# Patient Record
Sex: Male | Born: 1935 | Race: White | Hispanic: No | Marital: Married | Smoking: Former smoker
Health system: Southern US, Community
[De-identification: ages and names within clinical notes are randomized; demographics above are authoritative.]

## PROBLEM LIST (undated history)

## (undated) DIAGNOSIS — K5790 Diverticulosis of intestine, part unspecified, without perforation or abscess without bleeding: Secondary | ICD-10-CM

## (undated) DIAGNOSIS — I480 Paroxysmal atrial fibrillation: Secondary | ICD-10-CM

## (undated) DIAGNOSIS — F0280 Dementia in other diseases classified elsewhere without behavioral disturbance: Secondary | ICD-10-CM

## (undated) DIAGNOSIS — Z7901 Long term (current) use of anticoagulants: Secondary | ICD-10-CM

## (undated) DIAGNOSIS — M1990 Unspecified osteoarthritis, unspecified site: Secondary | ICD-10-CM

## (undated) DIAGNOSIS — R200 Anesthesia of skin: Secondary | ICD-10-CM

## (undated) DIAGNOSIS — R202 Paresthesia of skin: Secondary | ICD-10-CM

## (undated) DIAGNOSIS — R351 Nocturia: Secondary | ICD-10-CM

## (undated) DIAGNOSIS — G20 Parkinson's disease: Secondary | ICD-10-CM

## (undated) DIAGNOSIS — G20A1 Parkinson's disease without dyskinesia, without mention of fluctuations: Secondary | ICD-10-CM

## (undated) DIAGNOSIS — F419 Anxiety disorder, unspecified: Secondary | ICD-10-CM

## (undated) DIAGNOSIS — D649 Anemia, unspecified: Secondary | ICD-10-CM

## (undated) DIAGNOSIS — Z973 Presence of spectacles and contact lenses: Secondary | ICD-10-CM

## (undated) DIAGNOSIS — M7120 Synovial cyst of popliteal space [Baker], unspecified knee: Secondary | ICD-10-CM

## (undated) DIAGNOSIS — I951 Orthostatic hypotension: Secondary | ICD-10-CM

## (undated) DIAGNOSIS — G309 Alzheimer's disease, unspecified: Secondary | ICD-10-CM

## (undated) DIAGNOSIS — G250 Essential tremor: Secondary | ICD-10-CM

## (undated) DIAGNOSIS — G909 Disorder of the autonomic nervous system, unspecified: Secondary | ICD-10-CM

## (undated) DIAGNOSIS — K648 Other hemorrhoids: Secondary | ICD-10-CM

## (undated) DIAGNOSIS — K5909 Other constipation: Secondary | ICD-10-CM

## (undated) HISTORY — DX: Other constipation: K59.09

## (undated) HISTORY — DX: Anxiety disorder, unspecified: F41.9

## (undated) HISTORY — DX: Diverticulosis of intestine, part unspecified, without perforation or abscess without bleeding: K57.90

## (undated) HISTORY — PX: TONSILLECTOMY: SUR1361

## (undated) HISTORY — PX: COLONOSCOPY: SHX174

## (undated) HISTORY — DX: Parkinson's disease without dyskinesia, without mention of fluctuations: G20.A1

## (undated) HISTORY — DX: Synovial cyst of popliteal space (Baker), unspecified knee: M71.20

## (undated) HISTORY — DX: Anemia, unspecified: D64.9

## (undated) HISTORY — DX: Other hemorrhoids: K64.8

## (undated) HISTORY — DX: Parkinson's disease: G20

---

## 1999-03-11 HISTORY — PX: OTHER SURGICAL HISTORY: SHX169

## 2001-01-08 HISTORY — PX: HAND SURGERY: SHX662

## 2011-03-11 HISTORY — PX: CATARACT EXTRACTION W/ INTRAOCULAR LENS IMPLANT: SHX1309

## 2014-02-07 DIAGNOSIS — G20 Parkinson's disease: Secondary | ICD-10-CM | POA: Insufficient documentation

## 2014-02-07 DIAGNOSIS — F0280 Dementia in other diseases classified elsewhere without behavioral disturbance: Secondary | ICD-10-CM | POA: Insufficient documentation

## 2014-08-18 ENCOUNTER — Ambulatory Visit (INDEPENDENT_AMBULATORY_CARE_PROVIDER_SITE_OTHER): Payer: Medicare Other | Admitting: Neurology

## 2014-08-18 ENCOUNTER — Encounter (INDEPENDENT_AMBULATORY_CARE_PROVIDER_SITE_OTHER): Payer: Self-pay

## 2014-08-18 ENCOUNTER — Encounter: Payer: Self-pay | Admitting: Neurology

## 2014-08-18 VITALS — BP 150/82 | HR 66 | Resp 16 | Ht 74.0 in | Wt 197.4 lb

## 2014-08-18 DIAGNOSIS — F329 Major depressive disorder, single episode, unspecified: Secondary | ICD-10-CM | POA: Diagnosis not present

## 2014-08-18 DIAGNOSIS — R0683 Snoring: Secondary | ICD-10-CM | POA: Diagnosis not present

## 2014-08-18 DIAGNOSIS — F32A Depression, unspecified: Secondary | ICD-10-CM | POA: Insufficient documentation

## 2014-08-18 DIAGNOSIS — G20 Parkinson's disease: Secondary | ICD-10-CM

## 2014-08-18 DIAGNOSIS — F320 Major depressive disorder, single episode, mild: Secondary | ICD-10-CM | POA: Insufficient documentation

## 2014-08-18 DIAGNOSIS — R4189 Other symptoms and signs involving cognitive functions and awareness: Secondary | ICD-10-CM | POA: Diagnosis not present

## 2014-08-18 MED ORDER — CARBIDOPA-LEVODOPA ER 25-100 MG PO TBCR: EXTENDED_RELEASE_TABLET | ORAL | Status: DC

## 2014-08-18 MED ORDER — CARBIDOPA-LEVODOPA 25-100 MG PO TABS: 1.0000 | ORAL_TABLET | Freq: Two times a day (BID) | ORAL | Status: DC
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
## 2014-08-23 ENCOUNTER — Telehealth: Payer: Self-pay | Admitting: Neurology

## 2014-08-23 MED ORDER — CARBIDOPA-LEVODOPA ER 25-100 MG PO TBCR: EXTENDED_RELEASE_TABLET | ORAL | Status: DC

## 2014-08-23 NOTE — Telephone Encounter (Signed)
It appears the pharmacy did confirm receipt of this Rx on 06/10.  I called them to verify.  Spoke with Nedra Hai.  She said they only have the IR ready, not ER.  Thinks perhaps it was deleted in error, thinking it was a duplicate Rx.  She asked that we re-send this prescription, which has been done.   Receipt confirmed by pharmacy.  I called the patient back to advise.  Got no answer.  Left message.

## 2014-08-23 NOTE — Telephone Encounter (Signed)
Patient's wife([name]) called stating the pharmacy is stating the Carbidopa-Levodopa ER (SINEMET CR) 25-100 MG tablet controlled release  Script was not received. She says the pharmacy told the patient he would have to take care of it. He is very anxious. Please call and advise. Patient can be reached at [phone].

## 2014-09-12 ENCOUNTER — Telehealth: Payer: Self-pay | Admitting: Neurology

## 2014-09-12 NOTE — Telephone Encounter (Signed)
Patients wife called and requested to speak with the nurse regarding her husbands medication Rx. Carbidopa-Levodopa ER (SINEMET CR) 25-100 MG tablet controlled release. She states that he is experiencing some negative side effects from the medication. Please call and advise.

## 2014-09-12 NOTE — Telephone Encounter (Signed)
I have spoken with Mr. Stephen Gentry this afternoon and per RAS, advised that  constipation/neck pain are likely not related to Sinemet, but that if he would like to d/c hs dose of ER Sinemet, he may but should continue regular Sinemet as rx'd.  Mr. Stephen Gentry verbalized understanding of same/fim

## 2014-09-12 NOTE — Telephone Encounter (Signed)
LMTC./fim 

## 2014-09-12 NOTE — Telephone Encounter (Signed)
Patients wife (catherine) called returning Faith's call.

## 2015-02-19 ENCOUNTER — Ambulatory Visit (INDEPENDENT_AMBULATORY_CARE_PROVIDER_SITE_OTHER): Payer: Medicare Other | Admitting: Neurology

## 2015-02-19 ENCOUNTER — Encounter: Payer: Self-pay | Admitting: Neurology

## 2015-02-19 VITALS — BP 128/70 | HR 72 | Resp 14 | Ht 74.0 in | Wt 197.0 lb

## 2015-02-19 DIAGNOSIS — E559 Vitamin D deficiency, unspecified: Secondary | ICD-10-CM | POA: Diagnosis not present

## 2015-02-19 DIAGNOSIS — R413 Other amnesia: Secondary | ICD-10-CM | POA: Diagnosis not present

## 2015-02-19 DIAGNOSIS — R5383 Other fatigue: Secondary | ICD-10-CM | POA: Insufficient documentation

## 2015-02-19 DIAGNOSIS — G20 Parkinson's disease: Secondary | ICD-10-CM

## 2015-02-19 DIAGNOSIS — F32A Depression, unspecified: Secondary | ICD-10-CM

## 2015-02-19 DIAGNOSIS — F329 Major depressive disorder, single episode, unspecified: Secondary | ICD-10-CM

## 2015-02-19 DIAGNOSIS — F320 Major depressive disorder, single episode, mild: Secondary | ICD-10-CM

## 2015-02-19 DIAGNOSIS — R0683 Snoring: Secondary | ICD-10-CM

## 2015-02-19 DIAGNOSIS — E569 Vitamin deficiency, unspecified: Secondary | ICD-10-CM | POA: Diagnosis not present

## 2015-02-19 MED ORDER — ESCITALOPRAM OXALATE 10 MG PO TABS: 10.0000 mg | ORAL_TABLET | Freq: Every day | ORAL | Status: DC

## 2015-02-19 MED ORDER — CARBIDOPA-LEVODOPA 25-100 MG PO TABS: 1.0000 | ORAL_TABLET | Freq: Three times a day (TID) | ORAL | Status: DC
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
## 2015-02-20 ENCOUNTER — Telehealth: Payer: Self-pay | Admitting: *Deleted

## 2015-02-20 LAB — TSH: TSH: 2.93 u[IU]/mL (ref 0.450–4.500)

## 2015-02-20 LAB — VITAMIN D 25 HYDROXY (VIT D DEFICIENCY, FRACTURES): VIT D 25 HYDROXY: 42.5 ng/mL (ref 30.0–100.0)

## 2015-02-20 LAB — VITAMIN B12: Vitamin B-12: 441 pg/mL (ref 211–946)

## 2015-02-20 NOTE — Telephone Encounter (Signed)
-----   Message from Asa Lenteichard A Sater, MD sent at 02/20/2015  8:27 AM EST ----- Please let them know that the lab work looks normal

## 2015-02-20 NOTE — Telephone Encounter (Signed)
-----   Message from Richard A Sater, MD sent at 02/20/2015  8:27 AM EST ----- Please let them know that the lab work looks normal 

## 2015-02-20 NOTE — Telephone Encounter (Signed)
I have spoken with Baldo AshCarl this afternoon, and per RAS, advised that labs done in our office yesterday were normal.  He verbalized understanding of same/fim

## 2015-02-20 NOTE — Telephone Encounter (Signed)
LMTC./fim 

## 2015-03-14 ENCOUNTER — Ambulatory Visit (INDEPENDENT_AMBULATORY_CARE_PROVIDER_SITE_OTHER): Payer: Medicare Other | Admitting: Neurology

## 2015-03-14 DIAGNOSIS — R5383 Other fatigue: Secondary | ICD-10-CM

## 2015-03-14 DIAGNOSIS — R0683 Snoring: Secondary | ICD-10-CM

## 2015-03-14 DIAGNOSIS — G4710 Hypersomnia, unspecified: Secondary | ICD-10-CM

## 2015-03-14 DIAGNOSIS — R413 Other amnesia: Secondary | ICD-10-CM

## 2015-03-14 DIAGNOSIS — G2581 Restless legs syndrome: Secondary | ICD-10-CM

## 2015-03-15 NOTE — Sleep Study (Signed)
Please see the scanned sleep study interpretation located in the procedure tab in the chart view section.  

## 2015-03-16 ENCOUNTER — Telehealth: Payer: Self-pay | Admitting: *Deleted

## 2015-03-16 NOTE — Telephone Encounter (Signed)
-----   Message from Asa Lenteichard A Sater, MD sent at 03/15/2015  3:07 PM EST ----- Sleep study did not show any sleep apnea. However, he does have a lot of restless leg syndrome. We can start ropinirole 0.25 mg, 1 at dinner and one at bedtime if he wants      #60   #11

## 2015-03-16 NOTE — Telephone Encounter (Signed)
I have spoken with wife and per RAS, advised that Mr. Stephen Gentry's sleep study did not show any osa, but he does have a lot of rls.  Have offered Requip per RAS.  Mrs. [name] sts. pt. does not like to take meds--she does not believe he will take Requip.  She will discuss this message with him and they will call back if he would like rx. for Requip./fim

## 2015-06-22 ENCOUNTER — Ambulatory Visit (INDEPENDENT_AMBULATORY_CARE_PROVIDER_SITE_OTHER): Payer: Medicare Other | Admitting: Neurology

## 2015-06-22 ENCOUNTER — Encounter: Payer: Self-pay | Admitting: Neurology

## 2015-06-22 VITALS — BP 112/72 | HR 67 | Ht 74.0 in | Wt 200.0 lb

## 2015-06-22 DIAGNOSIS — R0683 Snoring: Secondary | ICD-10-CM

## 2015-06-22 DIAGNOSIS — G20 Parkinson's disease: Secondary | ICD-10-CM | POA: Diagnosis not present

## 2015-06-22 DIAGNOSIS — F320 Major depressive disorder, single episode, mild: Secondary | ICD-10-CM

## 2015-06-22 DIAGNOSIS — G4761 Periodic limb movement disorder: Secondary | ICD-10-CM

## 2015-06-22 DIAGNOSIS — R413 Other amnesia: Secondary | ICD-10-CM

## 2015-06-22 DIAGNOSIS — F329 Major depressive disorder, single episode, unspecified: Secondary | ICD-10-CM | POA: Diagnosis not present

## 2015-06-22 DIAGNOSIS — R5383 Other fatigue: Secondary | ICD-10-CM | POA: Diagnosis not present

## 2015-06-22 DIAGNOSIS — F32A Depression, unspecified: Secondary | ICD-10-CM

## 2015-06-22 MED ORDER — CARBIDOPA-LEVODOPA ER 25-100 MG PO TBCR: 1.0000 | EXTENDED_RELEASE_TABLET | Freq: Two times a day (BID) | ORAL | Status: DC
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
## 2015-06-22 NOTE — Patient Instructions (Signed)
Take 1 pill of the immediate release in the morning, 1 pill of the immediate release in the afternoon and one half or one pill of the time release at bedtime

## 2015-10-23 ENCOUNTER — Ambulatory Visit (INDEPENDENT_AMBULATORY_CARE_PROVIDER_SITE_OTHER): Payer: Medicare Other | Admitting: Neurology

## 2015-10-23 ENCOUNTER — Encounter: Payer: Self-pay | Admitting: Neurology

## 2015-10-23 VITALS — BP 104/62 | Resp 18 | Ht 74.0 in | Wt 190.0 lb

## 2015-10-23 DIAGNOSIS — G4761 Periodic limb movement disorder: Secondary | ICD-10-CM

## 2015-10-23 DIAGNOSIS — F329 Major depressive disorder, single episode, unspecified: Secondary | ICD-10-CM

## 2015-10-23 DIAGNOSIS — R4189 Other symptoms and signs involving cognitive functions and awareness: Secondary | ICD-10-CM

## 2015-10-23 DIAGNOSIS — R5383 Other fatigue: Secondary | ICD-10-CM

## 2015-10-23 DIAGNOSIS — G20 Parkinson's disease: Secondary | ICD-10-CM | POA: Diagnosis not present

## 2015-10-23 DIAGNOSIS — F32A Depression, unspecified: Secondary | ICD-10-CM

## 2015-10-23 DIAGNOSIS — F320 Major depressive disorder, single episode, mild: Secondary | ICD-10-CM

## 2015-10-23 MED ORDER — CARBIDOPA-LEVODOPA 25-100 MG PO TABS: 1.0000 | ORAL_TABLET | Freq: Three times a day (TID) | ORAL | 11 refills | Status: DC

## 2015-10-23 MED ORDER — AMANTADINE HCL 100 MG PO CAPS: 100.0000 mg | ORAL_CAPSULE | Freq: Two times a day (BID) | ORAL | 11 refills | Status: DC

## 2015-10-23 MED ORDER — ESCITALOPRAM OXALATE 10 MG PO TABS
10.0000 mg | ORAL_TABLET | Freq: Every day | ORAL | 11 refills | Status: DC
Start: 2015-10-23 — End: 2016-04-30

## 2015-10-23 MED ORDER — CARBIDOPA-LEVODOPA ER 25-100 MG PO TBCR: 1.0000 | EXTENDED_RELEASE_TABLET | Freq: Two times a day (BID) | ORAL | 11 refills | Status: DC
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
## 2015-10-30 ENCOUNTER — Telehealth: Payer: Self-pay | Admitting: Neurology

## 2015-10-30 NOTE — Telephone Encounter (Signed)
Leigh with [facility] in [place] is calling to clarify the correct dosaged for medications  carbidopa-levodopa (SINEMET IR) 25-100 MG tablet and Carbidopa-Levodopa ER (SINEMET CR) 25-100 MG tablet controlled release,

## 2015-10-30 NOTE — Telephone Encounter (Signed)
I have spoken with Leigh at [facility] and confirmed doses for pt's IR and ER Sinemet/fim

## 2015-11-08 NOTE — Telephone Encounter (Signed)
[facility] [address] [place] [phone] called to advise Extended Release of Carbidopa-Levodopa ER (SINEMET CR) 25-100 MG tablet controlled release is not available, availability is unknown at this time.

## 2016-03-03 ENCOUNTER — Other Ambulatory Visit: Payer: Self-pay | Admitting: Neurology

## 2016-04-24 ENCOUNTER — Encounter: Payer: Self-pay | Admitting: Neurology

## 2016-04-24 ENCOUNTER — Ambulatory Visit (INDEPENDENT_AMBULATORY_CARE_PROVIDER_SITE_OTHER): Payer: Medicare Other | Admitting: Neurology

## 2016-04-24 VITALS — BP 118/68 | HR 66 | Resp 18 | Ht 74.0 in | Wt 193.0 lb

## 2016-04-24 DIAGNOSIS — R5383 Other fatigue: Secondary | ICD-10-CM

## 2016-04-24 DIAGNOSIS — F32A Depression, unspecified: Secondary | ICD-10-CM

## 2016-04-24 DIAGNOSIS — F419 Anxiety disorder, unspecified: Secondary | ICD-10-CM | POA: Insufficient documentation

## 2016-04-24 DIAGNOSIS — G20 Parkinson's disease: Secondary | ICD-10-CM | POA: Diagnosis not present

## 2016-04-24 DIAGNOSIS — G20A1 Parkinson's disease without dyskinesia, without mention of fluctuations: Secondary | ICD-10-CM

## 2016-04-24 DIAGNOSIS — F320 Major depressive disorder, single episode, mild: Secondary | ICD-10-CM | POA: Diagnosis not present
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
## 2016-04-24 NOTE — Patient Instructions (Signed)
Continue to take the Sinemet immediate release twice a day. At bedtime, take 2 of the time release Sinemet.

## 2016-04-30 ENCOUNTER — Telehealth: Payer: Self-pay | Admitting: *Deleted

## 2016-04-30 MED ORDER — ESCITALOPRAM OXALATE 10 MG PO TABS: 10.0000 mg | ORAL_TABLET | Freq: Every day | ORAL | 3 refills | Status: DC

## 2016-04-30 NOTE — Telephone Encounter (Signed)
90 day rx. for Escitalopram escribed to [facility] per faxed request/fim

## 2016-06-02 ENCOUNTER — Telehealth: Payer: Self-pay | Admitting: *Deleted

## 2016-06-02 MED ORDER — ESCITALOPRAM OXALATE 10 MG PO TABS: 10.0000 mg | ORAL_TABLET | Freq: Every day | ORAL | 3 refills | Status: DC

## 2016-06-02 MED ORDER — CARBIDOPA-LEVODOPA ER 25-100 MG PO TBCR: 1.0000 | EXTENDED_RELEASE_TABLET | Freq: Two times a day (BID) | ORAL | 3 refills | Status: DC

## 2016-06-02 NOTE — Telephone Encounter (Signed)
90 day rx's for Carbidopa CR and Escitalopram escribed to [facility] per faxed request/fim

## 2016-09-23 ENCOUNTER — Telehealth: Payer: Self-pay | Admitting: Neurology

## 2016-09-23 NOTE — Telephone Encounter (Signed)
Patients wife called requesting patient be seen sooner appointment scheduled for 8/30, unable to do this Thursday.  Patient has been very anxious, worsening in constipation, patient feels that he has the flu (without the body aches).  Symptoms began last Wednesday.  Please call

## 2016-09-23 NOTE — Telephone Encounter (Signed)
I have spoken with Baldo AshCarl this afternoon.  He sts. he is having more intermittent shaking, possibly more anxious.  Denies stomach upset or signs of infection.  Appt. given 09/25/16/fim

## 2016-09-25 ENCOUNTER — Encounter: Payer: Self-pay | Admitting: Neurology

## 2016-09-25 ENCOUNTER — Ambulatory Visit (INDEPENDENT_AMBULATORY_CARE_PROVIDER_SITE_OTHER): Payer: Medicare Other | Admitting: Neurology

## 2016-09-25 VITALS — BP 125/73 | HR 82 | Resp 18 | Ht 74.0 in | Wt 182.5 lb

## 2016-09-25 DIAGNOSIS — R413 Other amnesia: Secondary | ICD-10-CM

## 2016-09-25 DIAGNOSIS — F419 Anxiety disorder, unspecified: Secondary | ICD-10-CM | POA: Diagnosis not present

## 2016-09-25 DIAGNOSIS — G20 Parkinson's disease: Secondary | ICD-10-CM | POA: Diagnosis not present

## 2016-09-25 DIAGNOSIS — K5900 Constipation, unspecified: Secondary | ICD-10-CM

## 2016-09-25 DIAGNOSIS — F320 Major depressive disorder, single episode, mild: Secondary | ICD-10-CM | POA: Diagnosis not present

## 2016-09-25 DIAGNOSIS — F32A Depression, unspecified: Secondary | ICD-10-CM

## 2016-09-25 MED ORDER — ESCITALOPRAM OXALATE 20 MG PO TABS
20.0000 mg | ORAL_TABLET | Freq: Every day | ORAL | 3 refills | Status: DC
Start: 2016-09-25 — End: 2016-10-23
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
## 2016-09-25 NOTE — Patient Instructions (Signed)
At bedtime, take 1 capfull of polyethylene glycol, one senna pill and 1 Colace (or Metamucil)  Continue the carbidopa levodopa at the current dose.  I called in the higher dose of Lexapro. In the meantime decongest double up taking 2 of the 10mg  pills instead of 1

## 2016-09-26 ENCOUNTER — Encounter: Payer: Self-pay | Admitting: Neurology
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
## 2016-10-22 ENCOUNTER — Ambulatory Visit: Payer: Medicare Other | Admitting: Neurology

## 2016-10-23 ENCOUNTER — Ambulatory Visit (INDEPENDENT_AMBULATORY_CARE_PROVIDER_SITE_OTHER): Payer: Medicare Other | Admitting: Neurology

## 2016-10-23 ENCOUNTER — Encounter: Payer: Self-pay | Admitting: Neurology

## 2016-10-23 VITALS — BP 108/60 | HR 70 | Ht 75.0 in | Wt 177.0 lb

## 2016-10-23 DIAGNOSIS — F419 Anxiety disorder, unspecified: Secondary | ICD-10-CM

## 2016-10-23 DIAGNOSIS — R413 Other amnesia: Secondary | ICD-10-CM | POA: Diagnosis not present

## 2016-10-23 DIAGNOSIS — G20 Parkinson's disease: Secondary | ICD-10-CM | POA: Diagnosis not present

## 2016-10-23 DIAGNOSIS — K5900 Constipation, unspecified: Secondary | ICD-10-CM | POA: Diagnosis not present

## 2016-10-23 MED ORDER — CARBIDOPA-LEVODOPA 25-100 MG PO TABS: 1.0000 | ORAL_TABLET | Freq: Three times a day (TID) | ORAL | 0 refills | Status: DC

## 2016-10-23 NOTE — Addendum Note (Signed)
Addended bySilvio Pate: MCCRACKEN, JADE L on: 10/23/2016 09:53 AM   Modules accepted: Orders

## 2016-10-23 NOTE — Patient Instructions (Addendum)
1. Change Levodopa to the following: Carbidopa Levodopa 25/100 IR - 1 at 6 am, 1 at 11 am, 1 at 4 pm Carbidopa Levodopa 25/100 CR - 1 at 9 pm (bedtime)  You can take an extra half tablet daily if needed if you feel anxious  2. Patients age 80+:  You have been referred for a neurocognitive evaluation in our office.   The evaluation takes approximately two hours. The first part of the appointment is a clinical interview with the neuropsychologist (Dr. Elvis CoilMaryBeth Bailar). Please bring someone with you to this appointment if possible, as it is helpful for Dr. Alinda DoomsBailar to hear from both you and another adult who knows you well. After speaking with Dr. Alinda DoomsBailar, you will complete testing with her technician. The testing includes a variety of tasks- mostly question-and-answer, some paper-and-pencil. There is nothing you need to do to prepare for this appointment, but having a good night's sleep prior to the testing, and bringing eyeglasses and hearing aids (if you wear them), is advised.   About a week after the evaluation, you will return to follow up with Dr. Alinda DoomsBailar to review the test results. This appointment is about 30 minutes. If you would like a family member to receive this information as well, please bring them to the appointment.   We have to reserve several hours of the neuropsychologist's time and the psychometrician's time for your evaluation appointment. As such, please note that there is a No-Show fee of $100. If you are unable to attend any of your appointments, please contact our office as soon as possible to reschedule.    Patients age 80 and under:  You have been referred for a neurocognitive evaluation in our office.   The evaluation consists of three appointments.   1. The first appointment is about 45 minutes and is a clinical interview with the neuropsychologist (Dr. Elvis CoilMaryBeth Bailar). You can bring someone with you to this appointment, as it is helpful for Dr. Alinda DoomsBailar to hear from  both you and another adult who knows you well.   2. The second appointment is 2-3 hours long and is with the psychometrician Wallace Keller(Dana Chamberlain). You will complete a variety of tasks- mostly question-and-answer, some paper-and-pencil, some on the computer. There is nothing you need to do to prepare for this appointment, but having a good night's sleep prior to the testing, and bringing eyeglasses and hearing aids (if you wear them), is advised.   3. The final appointment is a follow-up with Dr. Alinda DoomsBailar where she will go over the test results with you and provide recommendations. This appointment is about 30 minutes.  If you would like a family member to receive this information as well, please bring them to the appointment.   We have to reserve several hours of the neuropsychologist's time and the psychometrician's time for your appointment. As such, please note that there is a No-Show fee of $100. If you are unable to attend any of your appointments, please contact our office as soon as possible to reschedule.

## 2016-10-23 NOTE — Addendum Note (Signed)
Addended bySilvio Pate: MCCRACKEN, JADE L on: 10/23/2016 10:15 AM   Modules accepted: Orders

## 2016-10-27 ENCOUNTER — Ambulatory Visit (INDEPENDENT_AMBULATORY_CARE_PROVIDER_SITE_OTHER): Payer: Medicare Other | Admitting: Psychology

## 2016-10-27 ENCOUNTER — Telehealth: Payer: Self-pay | Admitting: Neurology

## 2016-10-27 ENCOUNTER — Encounter: Payer: Self-pay | Admitting: Psychology

## 2016-10-27 DIAGNOSIS — G20 Parkinson's disease: Secondary | ICD-10-CM | POA: Diagnosis not present

## 2016-10-27 DIAGNOSIS — R413 Other amnesia: Secondary | ICD-10-CM

## 2016-10-27 DIAGNOSIS — F419 Anxiety disorder, unspecified: Secondary | ICD-10-CM

## 2016-10-27 NOTE — Telephone Encounter (Signed)
Stephen Distance, PsyD sent to Silvio Pate, CMA  Cc: Tat, Octaviano Batty, DO        This patient is going to start testing with Annabelle Harman in a minute or so, and wife would like the [employer] Can you give to her in the waiting room? They will be here for the next hour or so.    Recipe given.

## 2016-10-27 NOTE — Progress Notes (Signed)
   Neuropsychology Note  Stephen Gentry came in today for 1 hour of neuropsychological testing with technician, Wallace Keller, BS, under the supervision of Dr. Elvis Coil. The patient did not appear overtly distressed by the testing session, per behavioral observation or via self-report to the technician. Rest breaks were offered. Stephen Gentry will return within 2 weeks for a feedback session with [name] at which time his test performances, clinical impressions and treatment recommendations will be reviewed in detail. The patient understands he can contact our office should he require our assistance before this time.  Full report to follow.
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
## 2016-11-06 ENCOUNTER — Ambulatory Visit: Payer: Medicare Other | Admitting: Neurology

## 2016-12-03 ENCOUNTER — Telehealth: Payer: Self-pay | Admitting: Neurology

## 2016-12-03 NOTE — Telephone Encounter (Signed)
Spoke with patient's wife.   She states he has been having episodes of anxiety and increased tremors. At his last office visit he was told to try 1/2 tablet of Levodopa at these times. He has been doing this and has not noticed relief. When he has episodes the anxiety tends to last for hours. These do not occur at any particular time of day (can be morning, during the day, or evening).   He is on lexapro 10 mg tablets daily.   Please advise.

## 2016-12-03 NOTE — Telephone Encounter (Signed)
Pt's wife called and said pt is so anxious acting and tremoring really bad and would like a call back to advise

## 2016-12-03 NOTE — Telephone Encounter (Signed)
Patient and wife made aware.   He has only been taking 5 mg of lexapro. He will increase to 10 for a week, then increase to 20 mg.

## 2016-12-03 NOTE — Telephone Encounter (Signed)
Increase lexapro to 20 mg, as previously directed by [name] (I told him okay to hold on that to see if carbidopa/levodopa 25/100 helped).  If increasing lexapro doesn't help may need psychiatry in future

## 2016-12-18 ENCOUNTER — Encounter: Payer: Medicare Other | Admitting: Psychology

## 2016-12-18 NOTE — Progress Notes (Signed)
NEUROPSYCHOLOGICAL EVALUATION   Name:    Stephen Gentry  Date of Birth:   15-Jan-1935 Date of Interview:  10/27/2016 Date of Testing:  10/27/2016   Date of Feedback:  12/22/2016       Background Information:  Reason for Referral:  Stephen Gentry is a 82 y.o. right handed male referred by Dr. Lurena Joiner Tat to assess his current level of cognitive functioning and assist in differential diagnosis. The current evaluation consisted of a review of available medical records, an interview with the patient and his wife, and the completion of a neuropsychological testing battery. Informed consent was obtained.  History of Presenting Problem:  Stephen Gentry was diagnosed with Parkinson's disease in early 2015. He has been treated by two neurologists previously but recently transitioned care to Dr. Arbutus Leas and was seen by her on 10/23/2016. The patient and his wife have noticed memory decline over the past few years, onset coinciding with diagnosis of PD, mildly progressing over time. They report more difficulty with retrieval of information; on one occasion recently he could not recall his address when asked for it. He also reports more difficulty recalling recent events and what he read the night before, more difficulty concentrating, and reduced sense of direction when traveling outside of his town. He denies any difficulty with managing appointments, medications, or finances (his wife has always handled most of the finances). He drives locally in [place] only and does not have any trouble with that. They deny slowed processing speed and word finding difficulty. There is no known family history of dementia or PD.  The patient and his wife reported dramatic decline in physical functioning in early July of this year. Specifically, they report significantly reduced energy and stamina. He is much more lethargic and tires very easily/quickly. He does not nap but has to sit and rest frequently. He is watching much more TV. He  does not have any sleep difficulty at night but his sleep is interrupted by somewhat frequent urination (4-5 times last night, usually 2). He is hydrating well with water. He has issues with constipation and bloating. His appetite is reduced, and things do not taste as good to him. He has lost about 5 lbs unintentionally. He eats a healthy diet. He will be starting [employer] this week. He does not have regular falls; he has a history of only one fall and this seemed to be due to new glasses he was wearing at the time. He has possible visual illusions (seeing faces in patterned stimuli like patterned carpet or artwork) but no frank hallucinations.   His mood is overall very mellow per his wife. She also states he is "very sweet, considerate and grateful." He has reduced drive and energy to engage in tasks, and states it's harder to get organized or concentrate enough to carry out more complex tasks like his hobby of restoring and selling motorcycles. He has been experiencing episodes of acute anxiety, about 1-2x every 2-3 weeks. Dr. Arbutus Leas noted this could be dopamine dependent. He also experiences some depressed mood. His PCP prescribed Zoloft 3-4 years ago and it was changed to Lexapro 10 mg which he continues to take. He denies suicidal ideation or intention but has been thinking more about implications of worsening PD on quality of life.   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 Marital history: He has been married to his current wife for 31 years. He has 2 children, she has 1 child. They have 4 grandchildren.  Alcohol: Used to enjoy wine, has had very little alcohol in the past few years Tobacco: Former smoker, quit in 1958 per records SA: None    Medical History:  Past  Medical History:  Diagnosis Date  . Baker's cyst    right knee  . Parkinson's disease (HCC)   . Vision abnormalities     Current medications:  Outpatient Encounter Prescriptions as of 12/22/2016  Medication Sig  . b complex vitamins capsule Take 1 capsule by mouth daily.  . carbidopa-levodopa (SINEMET IR) 25-100 MG tablet Take 1 tablet by mouth 3 (three) times daily.  . Carbidopa-Levodopa ER (SINEMET CR) 25-100 MG tablet controlled release Take 1 tablet by mouth 2 (two) times daily. (Patient taking differently: Take 1 tablet by mouth at bedtime. )  . cholecalciferol (VITAMIN D) 400 units TABS tablet Take 400 Units by mouth.  . [employer]S VITAMIN B-12 500 MCG tablet   . escitalopram (LEXAPRO) 10 MG tablet Take 10 mg by mouth daily.  . polyethylene glycol powder (GLYCOLAX/MIRALAX) powder   . psyllium (METAMUCIL) 58.6 % packet Take 1 packet by mouth daily.  [name] senna (SENOKOT) 8.6 MG tablet Take by mouth.   No facility-administered encounter medications on file as of 12/22/2016.      Current Examination:  Behavioral Observations:  Appearance: Neatly, casually and appropriately dressed and groomed.  Gait: Ambulated independently, mild shuffling gait Speech: Fluent; reduced volume, mildly slowed. No word finding difficulty in conversational speech. Thought process: Linear, goal directed Affect: Flat (masked), but mood appears euthymic Interpersonal: Very pleasant, appropriate Orientation: Oriented to all spheres. Accurately named the current President but could not recall his predecessor.   Tests Administered: . Test of Premorbid Functioning (TOPF) . Wechsler Adult Intelligence Scale-Fourth Edition (WAIS-IV): Matrix Reasoning, Similarities, Block Design, Coding and Digit Span subtests . Wechsler Memory Scale-Fourth Edition (WMS-IV) Older Adult Version (ages 39-90): Logical Memory I, II and Recognition subtests  . DIRECTV Verbal Learning Test - 2nd Edition (CVLT-2) Short  Form . Repeatable Battery for the Assessment of Neuropsychological Status (RBANS) Form A:  Figure Copy and Recall subtests and Semantic Fluency subtest . Boston Diagnostic Aphasia Examination: Complex Ideational Material subtest . Controlled Oral Word Association Test (COWAT) . Trail Making Test A and B . Clock drawing test . [employer] (BNT) . Geriatric Depression Scale (GDS) 15 Item . Generalized Anxiety Disorder - 7 item screener (GAD-7)  Test Results: Note: Standardized scores are presented only for use by appropriately trained professionals and to allow for any future test-retest comparison. These scores should not be interpreted without consideration of all the information that is contained in the rest of the report. The most recent standardization samples from the test publisher or other sources were used whenever possible to derive standard scores; scores were corrected for age, gender, ethnicity and education when available.   Test Scores:  Test Name Raw Score Standardized Score Descriptor  TOPF 53/70 SS= 111 High average  WAIS-IV Subtests     Matrix Reasoning 14/26 ss= 13 High average  Similarities 24/36 ss= 11 Average  Block Design 43/66 ss= 15 Superior  Coding 38/135 ss= 10 Average  Digit Span Forward 9/16 ss= 9 Average  Digit Span Backward 6/16 ss= 8 Low end of average  WAIS-IV Index  Score     Perceptual Reasoning  SS= 123 Superior  WMS-IV Subtests     LM I 14/53 ss= 5 Borderline  LM II 1/39 ss= 4 Impaired  LM II Recognition 18/23 Cum %: 51-75   RBANS Subtests     Figure Copy 20/20 Z= 1.4 Superior  Figure Recall 7/20 Z= -1.1 Low average  Semantic Fluency 12/40 Z= -1.5 Borderline  CVLT-II Scores     Trial 1 3/9 Z= -2 Impaired  Trial 4 5/9 Z= -1.5 Borderline  Trials 1-4 total 17/36 T= 39 Low average  SD Free Recall 3/9 Z= -1.5 Borderline   LD Free Recall 0/9 Z= -1.5 Borderline   LD Cued Recall 3/9 Z= -1 Low average  Recognition Discriminability 9/9 hits, 2  false positives Z= 1 High average  Forced Choice Recognition 9/9  WNL  BDAE Subtest     Complex Ideational Material 12/12  WNL  COWAT-FAS 39 T= 52 Average  COWAT-Animals 17 T= 52 Average  Trail Making Test A  38" 0 errors T= 59 High average  Trail Making Test B  70" 0 errors T= 60 High average  Clock Drawing   WNL  BNT 58/60 T= 60 High average  GDS-15 3/15  WNL  GAD-7 2/21  WNL      Description of Test Results:  Premorbid verbal intellectual abilities were estimated to have been within the high average range based on a test of word reading. Psychomotor processing speed was average. Auditory attention and working memory were average. Visual-spatial construction was superior and clearly an area of relative strength. Language abilities were within normal limits. Specifically, confrontation naming was high average, and semantic verbal fluency ranged from borderline (for fruits and vegetables) to average (for animals). Auditory comprehension of complex ideational material was intact. With regard to verbal memory, encoding and acquisition of non-contextual information (i.e., word list) was low average across four learning trials. After a brief distracter task, free recall was borderline (3/9 items recalled). After a delay, free recall was borderline (0/3 items recalled). Cued recall was low average (3/9 items recalled). Performance on a yes/no recognition task was high average. On another verbal memory test, encoding and acquisition of contextual auditory information (i.e., short stories) was borderline. After a delay, free recall was impaired. Performance on a yes/no recognition task was within normal limits. With regard to non-verbal memory, delayed free recall of visual information was low average. Executive functioning was within normal limits. Mental flexibility and set-shifting were high average on Trails B. Verbal fluency with phonemic search restrictions was average. Verbal abstract reasoning  was average. Non-verbal abstract reasoning was high average. Performance on a clock drawing task was normal. On a self-report measures of mood, the patient's responses were not indicative of clinically significant depression or anxiety at the present time.    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 Results of testing revealed many areas of cognitive functioning that are within normal limits for age, including psychomotor processing speed, attention/working memory, language, visual spatial skills and executive functioning. Meanwhile, he did demonstrate impairments in encoding/retrieval of new information, particularly auditory information. Recognition memory was intact. This pattern is suggestive of disruption of fronto-subcortical networks. His cognitive profile is not indicative of dementia but instead warrants a diagnosis of mild cognitive impairment. Difficulties with encoding and retrieval of new information are common in Parkinson's disease and I suspect this is the etiology. The patient did not endorse significant depression or anxiety at the present time but I  understand his anxiety has been an issue recently and he is likely having some adjustment-related depression (related to declining physical health).     Recommendations/Plan: Based on the findings of the present evaluation, the following recommendations are offered:  1. Strategies to compensate for memory encoding/retrieval deficits were reviewed. 2. Education regarding cognitive symptoms of PD was provided. 3. Compliance with Lexapro is recommended to see if this helps with adjustment related depression/anxiety. I also wonder if he might benefit from counseling from the movement disorders social worker. 4. I understand he recently started [employer] and I encouraged him to continue this as I think it could not only help with PD physical symptoms but also mood.  5. If a change or worsening of  cognitive symptoms is reported or observed, neuropsychological re-evaluation should be considered. 6. The patient and his wife have many remaining questions about symptoms he is experiencing (severe fatigue, intermittent nausea, loss of appetite with weight loss, constipation) and about possible medications to enhance alertness and reduce fatigue. They will follow up with Dr. Arbutus Leas next month to discuss these issues.   Feedback to Patient: Stephen Gentry and his wife returned for a feedback appointment on 12/22/2016 to review the results of his neuropsychological evaluation with this provider. 40 minutes face-to-face time was spent reviewing his test results, my impressions and my recommendations as detailed above.    Total time spent on this patient's case: [id number] unit for interview with psychologist; [phone] units of testing by psychometrician under psychologist's supervision; [phone] units for medical record review, scoring of neuropsychological tests, interpretation of test results, preparation of this report, and review of results to the patient by psychologist.      Thank you for your referral of Stephen Gentry. Please feel free to contact me if you have any questions or concerns regarding this report.

## 2016-12-22 ENCOUNTER — Encounter: Payer: Self-pay | Admitting: Psychology

## 2016-12-22 ENCOUNTER — Ambulatory Visit (INDEPENDENT_AMBULATORY_CARE_PROVIDER_SITE_OTHER): Payer: Medicare Other | Admitting: Psychology

## 2016-12-22 DIAGNOSIS — G20 Parkinson's disease: Secondary | ICD-10-CM | POA: Diagnosis not present

## 2016-12-22 DIAGNOSIS — G20A1 Parkinson's disease without dyskinesia, without mention of fluctuations: Secondary | ICD-10-CM

## 2016-12-22 DIAGNOSIS — R413 Other amnesia: Secondary | ICD-10-CM

## 2016-12-22 DIAGNOSIS — G3184 Mild cognitive impairment, so stated: Secondary | ICD-10-CM

## 2016-12-22 NOTE — Patient Instructions (Signed)
Strategies to enhance cognitive functioning Attention, concentration, memory encoding and consolidation    . Make a plan and be prepared o If you find that you are more attentive at certain times of the day (i.e., the morning), plan important activities and discussions at that time o Determine which activities take the most time and which are most important, then prioritize your "to do list" based on this information o Break tasks into simpler parts, understand the steps involved before starting o Rehearse the steps mentally or write them down. If you write them down, you can use this as a checklist to check off as you complete them. o Visualize completing the task  . Use external aids  o Write everything down that you do not need to know or work with right now. Don't store extra information in your brain that you don't need right now.  o Use a calendar or planner to make checklists, due dates and "to do" lists. o Use ONLY ONE calendar or planner and look at it frequently o Set alarms for important deadlines or appointments  . Minimize interruptions and distractions  o Find a good work environment, e.g., quiet room with a desk, close curtains, use earplugs, mask sounds with a fan or white noise machine o Turn off cell phone and/or email alerts during important tasks. In fact, it is helpful to schedule a block of time each day where you limit phone and email interruptions and focus on just the more detailed work you have. o Try to minimize the amount of background noise (i.e., television, music) when engaged in important tasks or conversations with others (note that some individuals find soft background music helpful in minimize distraction, so you may need to experiment with optimal level of noise for specific situations)  . Use active effort = consciously attending to details, closely analyzing o Failures of encoding may reflect failure to attend to one's own actions o Be prepared to work  more slowly than you usually do  o When reading, allow time for re-reading sections  o Check your work for errors  . Avoid multitasking o Do not attempt to complete more than one task at one time. Focus on one task until it is completed and then move on to the next one. o Avoid other activities while engaged in important tasks, such as talking on the phone while balancing the checkbook, making a shopping list during a meeting.   . Use self-talk during tasks o Repeat the steps of the activity to yourself as you complete them o Talk to yourself about your progress o This helps you maintain focus on the task and makes it easier to remember completing the task (Similar to "active effort" above)  . Conserve energy o Conserve energy to avoid fatigue and its effects on cognition - Get enough sleep - Pace yourself  and make sure to take breaks - Be open to receiving help - Exercise for increased energy  . Conversational vigilance = paying attention during a conversation  o Listen actively: focus on the speaker and position yourself so that you can clearly hear the him/her, and have open/relaxed posture  o Eye contact: Maintaining eye contact with the person you are speaking with may increase the likelihood that important information is properly received  o Ask questions: Ask questions for clarification (e.g., request that the speaker explain something in a different way) or ask for information to be repeated if you become distracted, or if you do not   hear or understand something during a conversation  o Paraphrase: Summarize or repeat back important information from a conversation in your own words to facilitate communication and ensure that you have heard correctly and understand  
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
## 2017-01-27 ENCOUNTER — Ambulatory Visit: Payer: Medicare Other | Admitting: Neurology

## 2017-01-27 ENCOUNTER — Encounter: Payer: Self-pay | Admitting: Neurology

## 2017-01-27 ENCOUNTER — Ambulatory Visit (INDEPENDENT_AMBULATORY_CARE_PROVIDER_SITE_OTHER): Payer: Medicare Other | Admitting: Neurology

## 2017-01-27 VITALS — BP 120/64 | HR 72 | Ht 73.0 in | Wt 180.0 lb

## 2017-01-27 DIAGNOSIS — G20 Parkinson's disease: Secondary | ICD-10-CM

## 2017-01-27 DIAGNOSIS — K5901 Slow transit constipation: Secondary | ICD-10-CM | POA: Diagnosis not present

## 2017-01-27 MED ORDER — CARBIDOPA-LEVODOPA 25-100 MG PO TABS: 1.0000 | ORAL_TABLET | Freq: Every day | ORAL | 1 refills | Status: DC

## 2017-01-27 NOTE — Patient Instructions (Signed)
1.  I will send a gastroenterology referral to [facility] 2.  Happy thanksgiving and merry christmas!

## 2017-01-27 NOTE — Addendum Note (Signed)
Addended bySilvio Pate: MCCRACKEN, JADE L on: 01/27/2017 10:02 AM   Modules accepted: Orders

## 2017-02-11 ENCOUNTER — Telehealth: Payer: Self-pay | Admitting: Internal Medicine

## 2017-02-11 NOTE — Telephone Encounter (Signed)
Referral in epic for patient to be seen for slow transit constipation. Patient has seen Dr.Gupta in [place] in past year, but was referred by Dr.Tat here. Patient wife not requesting certain doctor but states they need someone who is familiar with parkinson's disease and its relation to the gut, due to previous gi doc just treating pt with more miralax. Referral and gi records placed on DOD for PM 12.5.18; Dr.Pyrtle's desk for review.

## 2017-02-18 ENCOUNTER — Encounter: Payer: Self-pay | Admitting: Physician Assistant

## 2017-02-18 NOTE — Telephone Encounter (Signed)
Dr.Pyrtle reviewed records accepted patient. Okay to schedule appointment with him or an APP. [facility]

## 2017-03-09 ENCOUNTER — Ambulatory Visit (INDEPENDENT_AMBULATORY_CARE_PROVIDER_SITE_OTHER): Payer: Medicare Other | Admitting: Physician Assistant

## 2017-03-09 ENCOUNTER — Encounter: Payer: Self-pay | Admitting: Physician Assistant

## 2017-03-09 ENCOUNTER — Encounter (INDEPENDENT_AMBULATORY_CARE_PROVIDER_SITE_OTHER): Payer: Self-pay

## 2017-03-09 VITALS — BP 110/58 | HR 72 | Ht 73.0 in | Wt 175.2 lb

## 2017-03-09 DIAGNOSIS — K5901 Slow transit constipation: Secondary | ICD-10-CM | POA: Diagnosis not present

## 2017-03-09 DIAGNOSIS — R1030 Lower abdominal pain, unspecified: Secondary | ICD-10-CM

## 2017-03-09 MED ORDER — LUBIPROSTONE 8 MCG PO CAPS: 8.0000 ug | ORAL_CAPSULE | Freq: Two times a day (BID) | ORAL | 1 refills | Status: DC
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
## 2017-03-09 NOTE — Patient Instructions (Addendum)
We have sent the following medications to your pharmacy for you to pick up at your convenience:  Amitiza 8 mcg daily for 2 weeks if not working add in a second dose. If that doesn't work add in citrucel once daily. HOLD MIRALAX AND METAMUCIL.  Can also consider a daily probiotic such as Librarian, academicAlign.

## 2017-04-22 ENCOUNTER — Encounter: Payer: Self-pay | Admitting: *Deleted

## 2017-04-27 ENCOUNTER — Ambulatory Visit (INDEPENDENT_AMBULATORY_CARE_PROVIDER_SITE_OTHER): Payer: Medicare Other | Admitting: Internal Medicine

## 2017-04-27 ENCOUNTER — Encounter (INDEPENDENT_AMBULATORY_CARE_PROVIDER_SITE_OTHER): Payer: Self-pay

## 2017-04-27 ENCOUNTER — Encounter: Payer: Self-pay | Admitting: Internal Medicine

## 2017-04-27 VITALS — BP 100/60 | HR 66 | Ht 74.0 in | Wt 174.4 lb

## 2017-04-27 DIAGNOSIS — R140 Abdominal distension (gaseous): Secondary | ICD-10-CM

## 2017-04-27 DIAGNOSIS — K5900 Constipation, unspecified: Secondary | ICD-10-CM

## 2017-04-27 DIAGNOSIS — K6389 Other specified diseases of intestine: Secondary | ICD-10-CM | POA: Diagnosis not present

## 2017-04-27 MED ORDER — AMOXICILLIN-POT CLAVULANATE 875-125 MG PO TABS: 1.0000 | ORAL_TABLET | Freq: Two times a day (BID) | ORAL | 0 refills | Status: DC

## 2017-04-27 NOTE — Patient Instructions (Signed)
We have sent the following medications to your pharmacy for you to pick up at your convenience: Augmentin 875 mg twice daily x 10 days  Please call our office after you have completed your course of Augmentin to let us know how you are doing.  Continue your Miralax.  If you are age 81 or older, your body mass index should be between 23-30. Your Body mass index is 22.39 kg/m. If this is out of the aforementioned range listed, please consider follow up with your Primary Care Provider.  If you are age 81 or younger, your body mass index should be between 19-25. Your Body mass index is 22.39 kg/m. If this is out of the aformentioned range listed, please consider follow up with your Primary Care Provider.

## 2017-04-27 NOTE — Progress Notes (Signed)
Subjective:    Patient ID: Stephen Gentry, male    DOB: 06-12-1934, 83 y.o.   MRN: [id number]  HPI Stephen Gentry is an 83 year old male with a history of chronic constipation, colonic diverticulosis, Parkinson's disease who is seen in follow-up.  He is here today with his wife.  He was seen by Hyacinth Meeker, PA-C on 03/09/2017 to discuss chronic constipation, abdominal gas and mid and lower abdominal pain.  At that time Amitiza was recommended in addition to MiraLAX however when he got home his wife was reviewing records from [name] and realized that he had prior allergy to Amitiza, thus he has remained on MiraLAX 17 g daily and high-fiber diet.  In the past he tried Linzess which reportedly causes diarrhea.  He reports most recently that he has not felt constipated and he is having bowel movements anywhere from 0-2 times per day.  His bowel movements are not hard or difficult to pass.  He is not having diarrhea.  Bowel movements feel complete.  Very rarely does he skip a day without having a bowel movement.  He is feeling intermittent periumbilical and lower abdominal bloating and discomfort.  This is better with passing gas and bowel movement.  His biggest complaint is abdominal pressure and frequent flatulence.  He very rarely has belching.  No rectal bleeding or melena.  He does not have any new medications and continues on carbidopa levodopa and Lexapro.  He did have a colonoscopy on 02/07/2016 which was normal except for diverticulosis and internal hemorrhoids. He has had prior cross-sectional imaging CT scan abd/pelvis on multiple occasions.   Review of Systems As per HPI, otherwise negative  Current Medications, Allergies, Past Medical History, Past Surgical History, Family History and Social History were reviewed in [employer] record.     Objective:   Physical Exam BP 100/60   Pulse 66   Ht 6\' 2"  (1.88 m)   Wt 174 lb 6 oz (79.1 kg)   BMI 22.39 kg/m    Constitutional: Well-developed and well-nourished. No distress. HEENT: Normocephalic and atraumatic. Oropharynx is clear and moist. Conjunctivae are normal.  No scleral icterus. Neck: Neck supple. Trachea midline. Cardiovascular: Normal rate, regular rhythm and intact distal pulses. No M/R/G Pulmonary/chest: Effort normal and breath sounds normal. No wheezing, rales or rhonchi. Abdominal: Soft, nontender, nondistended. Bowel sounds active throughout. There are no masses palpable. No hepatosplenomegaly. Extremities: no clubbing, cyanosis, or edema Neurological: Alert and oriented to person place and time. Resting tremor right arm Skin: Skin is warm and dry. Psychiatric: Normal mood and affect. Behavior is normal.      Assessment & Plan:  83 year old male with a history of chronic constipation, colonic diverticulosis, Parkinson's disease who is seen in follow-up.  1.  Chronic constipation, significant abdominal bloating and flatulence --we discussed potential etiologies of his flatulence and abdominal bloating.  Certainly constipation contributes to this symptom and possibly is the predominant cause of his flatulence.  We discussed SIBO as a possible cause of his abdominal bloating and flatulence symptom.  We also discussed empiric treatment versus formal breath testing.  We discussed the risks, benefits and alternatives to empiric antibiotic therapy and after this discussion he is comfortable proceeding with empiric antibiotics, as am I.  I recommend that he continue with his MiraLAX and high-fiber diet.  Liberal fluid intake encouraged. --Augmentin 875 mg twice daily times 10 days.  May need retreatment quickly or again in the future depending on response (as can  occur with SIBO).  If no response and flatulence symptoms and SIBO was likely not the cause. --He is asked to call in mid March with response to antibiotics  25 minutes spent with the patient today. Greater than 50% was spent in  counseling and coordination of care with the patient

## 2017-05-11 ENCOUNTER — Telehealth: Payer: Self-pay | Admitting: Internal Medicine

## 2017-05-11 NOTE — Telephone Encounter (Signed)
Pts wife states that husband finished his antibiotic on Thursday. Reports his gas and bloating got better the first 3-4 days but is still having the same symptoms again. Please advise.
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
## 2017-05-11 NOTE — Telephone Encounter (Signed)
The improvement argues for an element of bacterial overgrowth; early retreatment can be required Thus would give a additional round of antibiotics with metronidazole 500 mg 3 times daily and cephalexin 500 mg 3 times daily times 10 days They should notify us of response to treatment after completing therapy

## 2017-05-12 ENCOUNTER — Other Ambulatory Visit: Payer: Self-pay

## 2017-05-12 MED ORDER — METRONIDAZOLE 500 MG PO TABS: 500.0000 mg | ORAL_TABLET | Freq: Three times a day (TID) | ORAL | 0 refills | Status: DC

## 2017-05-12 MED ORDER — CEPHALEXIN 500 MG PO CAPS: 500.0000 mg | ORAL_CAPSULE | Freq: Three times a day (TID) | ORAL | 0 refills | Status: DC

## 2017-05-12 NOTE — Telephone Encounter (Signed)
Spoke with pts wife and she is aware, scripts sent to pharmacy.

## 2017-05-13 ENCOUNTER — Ambulatory Visit (INDEPENDENT_AMBULATORY_CARE_PROVIDER_SITE_OTHER): Payer: Medicare Other | Admitting: Neurology

## 2017-05-13 ENCOUNTER — Encounter: Payer: Self-pay | Admitting: Neurology

## 2017-05-13 VITALS — BP 112/64 | HR 64 | Ht 75.0 in | Wt 179.0 lb

## 2017-05-13 DIAGNOSIS — R413 Other amnesia: Secondary | ICD-10-CM

## 2017-05-13 DIAGNOSIS — G20 Parkinson's disease: Secondary | ICD-10-CM | POA: Diagnosis not present

## 2017-05-13 DIAGNOSIS — R1013 Epigastric pain: Secondary | ICD-10-CM

## 2017-05-13 MED ORDER — CARBIDOPA-LEVODOPA 25-100 MG PO TABS: ORAL_TABLET | ORAL | 1 refills | Status: DC

## 2017-05-13 MED ORDER — ESCITALOPRAM OXALATE 20 MG PO TABS: 20.0000 mg | ORAL_TABLET | Freq: Every day | ORAL | 1 refills | Status: DC

## 2017-05-13 NOTE — Patient Instructions (Addendum)
Parkinson's Caregiver Group   Parkinson's disease can be challenging for caregivers too. Changing  abilities and assuming new roles within the family can cause emotional  upheaval. Meeting with a group of peers who are experiencing similar changes is very beneficial for any caregiver. This professionally led support group will provide a safe place for Parkinson's caregivers to connect, share challenges, seek solutions, learn about resources and strengthen coping skills.    When:  Last Monday of the month (unless date falls on a holiday)  2019 Dates: 1/28, 2/25, 3/25, 4/29, 5/20, 6/24, 7/29, 8/26, 9/30, 10/28, 11/25, 12/30   Location: [facility]                                                                                              [place], [place]                                                                                      Room 204 Time: 2-3:30   Please call Myra Gianotti, MSW, LCSW at [phone] with any questions.  Living with Memory Change: Keeping Active and Involved When you're told you have memory change or dementia, you may feel a range of different emotions. These often include fear or loss of self-confidence as well as uncertainty about the future. You might feel like you no longer want to go out or stay involved with the activities you usually do. However, it's important to keep enjoying the things you did before your diagnosis. To start with, it may be hard to adjust, but you should still be able to continue doing many of the things you do now, sometimes by making small changes. Above all, keep active and try to remain positive about the future.  Why stay active? Activities can help you stay independent and provide a great sense of enjoyment. They can also keep you in touch with other people and can improve your quality of  life. One of the most important ways of keeping involved and active is simply to talk to others, and not allow yourself to become isolated. Keeping in touch with other people can make you feel better now, and when you need more help  in the future.  Other ways staying active can help you include: . raising your self-esteem, helping you from feeling anxious or depressed . maintaining your skills for longer . using an activity to help you express your feelings . sharing your experiences with people who can provide support or have the same interests.  Activity ideas for people with dementia There are many activities that you can get involved in, some of which you may have never tried before. The following may be useful in providing suitable activities that you can do indoors or outside.  Activity ideas at home:   Cooking and household activities Preparing and cooking food is an activity many of [place] do every day. You can continue - or start - doing this as long as it is safe for you. However, if the activity becomes too difficult, there are some small changes you can make. You could ask someone to help you in the kitchen, follow a simpler recipe, or use prepared sauces rather than making your own. Other everyday household tasks like washing the dishes, folding clothes or dusting objects are also good ways of keeping active at home. Ask someone to do the task with you if it makes what you're doing more enjoyable.  Puzzles and games Any kind of pastime that keeps your mind active is beneficial and can also be a great way of spending time with other people. You could try doing a crossword puzzle, playing a board game or even an electronic game. If you start finding these options difficult, consider other options.  Psychologist, counselling - including tablet computers or even mobile phones - has many advantages that can help you stay involved. They can be easier to use than desktop  computers. You can also use touchscreen technology to interact with people around you. For example, on YouTube you can watch old videos or films from your past together, or listen to your favourite music. Music and singing can be very powerful ways to relive memories, and music is also used as a form of therapy. Music therapy is enjoyable but offers much more than just entertainment.  Other technologies like Skype - for communicating face-to-face with someone in another place using the internet - make it easier to keep in touch with family and friends who live far away. You can also try online games. They can be a good source of entertainment as well as a form of mental stimulation.  If you like to read but dementia is making this difficult, you can try switching to audio versions of books, newspapers and magazines. These will allow you to enjoy your favourite publications in a different way.  Activity ideas in the community There are many opportunities out in the community where you can find different activities and meet other people at the same time. This could be at a place of worship, an Retail buyer, Educational psychologist, Risk analyst, a community group, leisure centre or at the local pub. There are national drives towards making many of these places dementia-friendly, which means their staff and volunteers will be more understanding and have some   Arts and culture  Many heritage sites and arts or cultural venues are becoming more dementia-friendly. This means that the venue should be more welcoming to people with dementia, ultimately making your visit more enjoyable. Being 'dementia friendly' could be something simple, like having clearer signs in the venue. Alternatively, staff and volunteers might have had dementia awareness training and so understand a  little better what it's like to live with dementia. Some community venues organise events or activities which have been developed for and with people  living with dementia. Events range from more relaxed theatre performances or film screenings to special access and exhibitions or tours. A lot of places also now run creative activities which bring people with dementia together. The more popular groups often involve singing, making music or painting. Other activities include drama, dance, reading, writing or poetry. In all these cases, the details of the event or activity are probably not the main point. What matters is finding something which has meaning for you and that you enjoy. Not everyone will want to get involved in a group activity. But for those who do, a group setting can create a sense of togetherness and belonging, helping you keep active and involved. To find out what is happening in your area, ask at your local Alzheimer's [place], [place] or community centre. Your local Alzheimer's Society will also run, or know about, day centres where you might be able to go for a mix of social and club activities.  Exercise   Taking any form of exercise is good for your physical as well as emotional wellbeing. You may find it easier, safer, or more fun to take part in physical activities with other people rather than alone. This could be through walking, swimming, dancing or gentle exercise classes (eg tai chi or yoga). Ask at your local leisure center about sessions or classes that might be suitable for you. Many centers organize specific sessions for older people or those with dementia. Other forms of exercise, such as gardening, chair aerobics or gentle stretching, can also be done in and around the home. If you've not exercised much before and are thinking of starting, it's a good idea to talk to your GP or community nurse first. They can suggest ways for you to build up gradually and safely.  Travel  Travelling and going on holiday can bring a great sense of enjoyment. Living with dementia, you may find it easier to go to familiar places. When  considering where to go, think carefully about the practicalities. Long-distance travel can be particularly tiring. As with many things, travelling is easier if you have someone with you. However, it's not impossible if you do want to travel alone. Make sure you take the time to prepare thoroughly, make lists of things to pack, and write down details of your travel arrangements and any documents you need.   Feeling well Try to enjoy being in the moment and don't worry about what might lie in store. Whatever activity you choose, the most important thing is that it means something to you and helps you to feel good.

## 2017-05-26 ENCOUNTER — Telehealth: Payer: Self-pay | Admitting: Internal Medicine

## 2017-05-26 NOTE — Telephone Encounter (Signed)
Pts wife states that the antibiotics have not helped. He finished the second round that was given to him for SIBO and is still having a lot of bloating, gas, and abdominal pain. Please advise.

## 2017-05-26 NOTE — Telephone Encounter (Signed)
Left message for pt to call back  °

## 2017-05-28 NOTE — Telephone Encounter (Signed)
Pt's wife called back and said she is waiting on Dr. Rhea BeltonPyrtle to call her.  She may be reached at [phone]

## 2017-05-29 MED ORDER — LINACLOTIDE 72 MCG PO CAPS: 72.0000 ug | ORAL_CAPSULE | Freq: Every day | ORAL | 3 refills | Status: DC

## 2017-05-29 NOTE — Telephone Encounter (Signed)
I called and spoke to the patient and his daughter, Stephen Gentry He has continued to have bloating but more constipation than he was having at the time that I saw him in clinic. We treated empirically with 2 rounds of oral antibiotics which for a few days seem to help the abdominal bloating but now he is seeing no benefit  I recommended a bowel purge with MiraLAX, MiraLAX 255 g dissolved in 64 ounces of Gatorade.  He can drink this slowly as tolerated tomorrow morning until he has a complete bowel purge  Thereafter I recommend he start Linzess 72 mcg daily Please call this medication into [facility] [address] in [place]  I asked that he call me next week to let me know how he is doing Both he and his daughter voiced understanding and thanked me for the call

## 2017-05-29 NOTE — Telephone Encounter (Signed)
Script sent to pharmacy.

## 2017-05-29 NOTE — Telephone Encounter (Signed)
Patient wife calling back wanting to know if Dr.Pyrtle has advised on anything yet.

## 2017-06-02 ENCOUNTER — Telehealth: Payer: Self-pay | Admitting: Internal Medicine

## 2017-06-02 NOTE — Telephone Encounter (Signed)
Discussed with pts wife that he should have completed the miralax purge and then be on Linzess daily. She verbalized understanding and states that he is still having some bloating, they will call later in the week with an update.

## 2017-06-04 ENCOUNTER — Telehealth: Payer: Self-pay | Admitting: Internal Medicine

## 2017-06-04 MED ORDER — LINACLOTIDE 290 MCG PO CAPS: 290.0000 ug | ORAL_CAPSULE | Freq: Every day | ORAL | 3 refills | Status: DC

## 2017-06-04 NOTE — Telephone Encounter (Signed)
Spoke with pts wife and she is aware. Offered samples but she requested prescription be sent to pharmacy. Prescription sent in per request.

## 2017-06-04 NOTE — Telephone Encounter (Signed)
Wife states that pt did a miralax purge and had good results with that on Saturday. Pt started Linzess 72mcg on Sunday. Pt reports he is having a lot of gas, gurgling, lower abdominal pain and bloating. Wife states pt rates abd pain at a 6 or 7 and says it is constant. Only BM pt has had since Saturday was on Tuesday when pt took a saline enema. Please advise.

## 2017-06-04 NOTE — Telephone Encounter (Signed)
Increase Linzess to 290 mcg daily (samples can be offered) Can repeat MiraLax purge safely if desired He had a normal colonoscopy in Nov 2017, so repeating this at this point is low yield

## 2017-06-05 ENCOUNTER — Telehealth: Payer: Self-pay | Admitting: Internal Medicine

## 2017-06-05 NOTE — Telephone Encounter (Signed)
Pt's wife went to [facility] ED last night and pt had a CT scan and x-ray, there was no blockeage but was put on Golytely. Please call [name].

## 2017-06-05 NOTE — Telephone Encounter (Signed)
Wife called to let Dr. Rhea BeltonPyrtle know that she took Stephen Gentry to the ER in [place] last night because they thought he may have had a blockage but he did not. The ER gave pt Golytely and stated that he did not get cleaned out well with the Miralax purge. Pt knows to start the linzess after completing the golytely prep. Wife wanted to let Dr. Rhea BeltonPyrtle know.

## 2017-06-09 ENCOUNTER — Telehealth: Payer: Self-pay | Admitting: Internal Medicine

## 2017-06-09 NOTE — Telephone Encounter (Signed)
Spoke with pts wife and she is aware and will keep the appt tomorrow.

## 2017-06-09 NOTE — Telephone Encounter (Signed)
Attempted to call back, got answering machine. Pt has tentatively been scheduled to see Willette ClusterPaula Guenther NP tomorrow morning at 9:30am. Will try again.

## 2017-06-10 ENCOUNTER — Encounter: Payer: Self-pay | Admitting: Nurse Practitioner

## 2017-06-10 ENCOUNTER — Ambulatory Visit (INDEPENDENT_AMBULATORY_CARE_PROVIDER_SITE_OTHER): Payer: Medicare Other | Admitting: Nurse Practitioner

## 2017-06-10 ENCOUNTER — Encounter (INDEPENDENT_AMBULATORY_CARE_PROVIDER_SITE_OTHER): Payer: Self-pay

## 2017-06-10 VITALS — BP 110/56 | HR 72 | Ht 74.0 in | Wt 174.4 lb

## 2017-06-10 DIAGNOSIS — K5909 Other constipation: Secondary | ICD-10-CM

## 2017-06-10 NOTE — Patient Instructions (Signed)
If you are age 81 or older, your body mass index should be between 23-30. Your Body mass index is 22.39 kg/m. If this is out of the aforementioned range listed, please consider follow up with your Primary Care Provider.  If you are age 81 or younger, your body mass index should be between 19-25. Your Body mass index is 22.39 kg/m. If this is out of the aformentioned range listed, please consider follow up with your Primary Care Provider.   Continue Linzess 290 mcg every morning.  Add 17 gm of Miralax at lunch.  If no improvement in a few days, take Dulcolax 5 mg at  Bedtime.  Thank you for choosing me and [facility].   Willette ClusterPaula Guenther, NP

## 2017-06-10 NOTE — Progress Notes (Addendum)
IMPRESSION and PLAN:    83 yo male with Parkinson's (diagnosed 5 years ago) and chronic constipation. Bowel function has deteriorated over the last year and wife correlates this   with progressive decline in cognitive function and tremors. Aggressive bowel regimen with combination of medications is probably becoming necessary now.  -He is going to add prunes to diet.  -He already avoids crucefierous vegetables (or at least doesn't eat them raw) as they have potential to worsen bloating.  -add miralax back to regimen -continue linzess .  - If above effective will add Dulcolax 5mg  at bedtime.  -Surgical intervention for slow colonic transit would be a drastic measure   Addendum: Reviewed and agree with  management. Pyrtle, Carie CaddyJay M, MD       HPI:    Chief Complaint: constipation / abdominal pain   Patient is an 83 yo male with Parkinson's disease. He has chronic constipation and has tried numerous things to treat it incuding miralax, fiber and amitiza.  Didn't tolerate Amitiza due to nausea.  He has lower abdominal pain and distention, improved with defecation and passage of gas. In February we treated him empirically with two rounds of antibiotics for SIBO but he had no improvement in bloating / distention.  Several days ago he purged bowels with a prep then started Linzess. He had excellent results but sx recurred within a few days. Became miserable, went [facility], given Golytely and had a good response but again the sx recurred with a few days. Per wife CT scan done in ED was apparently negative. Patient called office 06/05/17, we increased linzess to 290 mcg.  He is frustrated, as is wife who accompanies him today.  Patient was diagnosed with Parkinson's 5 years ago. Cognitive function and tremors worsened about a year ago per wife. She does think this is when bowels started to be problematic as well. Currently averaging two BMs a week, some of which are just water. He also has to  work to pass gas which helps sx. He takes Phazyme but it doesn't help.   He had a colonoscopy on 02/07/2016 which was normal except for diverticulosis and internal hemorrhoids..  Review of systems: urinating okay. No chest pain or SOB:       Past Medical History:  Diagnosis Date  . Baker's cyst    right knee  . Chronic constipation   . Diverticulosis   . Internal hemorrhoids   . Parkinson's disease (HCC)   . Vision abnormalities     Patient's surgical history, family medical history, social history, medications and allergies were all reviewed in Epic    Physical Exam:     BP (!) 110/56   Pulse 72   Ht 6\' 2"  (1.88 m)   Wt 174 lb 6.4 oz (79.1 kg)   BMI 22.39 kg/m   GENERAL:  Well developed white male in NAD PSYCH: :Pleasant, cooperative, normal affect EENT:  conjunctiva pink, mucous membranes moist, neck supple without masses CARDIAC:  RRR, no peripheral edema PULM: Normal respiratory effort, lungs CTA bilaterally, no wheezing ABDOMEN:  Nondistended, soft, nontender. No obvious masses, no hepatomegaly,  normal bowel sounds SKIN:  turgor, no lesions seen Musculoskeletal:  Normal muscle tone, normal strength NEURO: Alert and oriented x 3, no focal neurologic deficits  I spent 25 minutes of face-to-face time with the patient. Greater than 50% of the time was spent counseling and coordinating care. Questions answered  Willette ClusterPaula Guenther , NP 06/10/2017, 10:14 AM

## 2017-06-12 ENCOUNTER — Encounter: Payer: Self-pay | Admitting: Nurse Practitioner

## 2017-06-22 ENCOUNTER — Telehealth: Payer: Self-pay | Admitting: Internal Medicine

## 2017-06-22 MED ORDER — PLECANATIDE 3 MG PO TABS: 3.0000 mg | ORAL_TABLET | Freq: Every day | ORAL | 0 refills | Status: DC

## 2017-06-22 NOTE — Telephone Encounter (Signed)
Spoke with pts wife and she is aware. Samples of Trulance left up front for pt to pick up.

## 2017-06-22 NOTE — Telephone Encounter (Signed)
Pts wife calling back again stating that the pt has been taking linzess 290 now for about 2 weeks and he is still having issues with constipation. He is taking miralax at noon and dulcolax at night. Reports he is still going several days without having a BM. He is still having a lot of gas. Wife wants to know if there is anything else they might do/try. Wonders if perhaps he may have H pylori and wanted to see what Dr. Rhea BeltonPyrtle thinks about that. Please advise.

## 2017-06-22 NOTE — Telephone Encounter (Signed)
He has an allergy to Amitiza Would try adding Trulance to the Linzess 290 mcg daily MiraLax still ok

## 2017-07-08 ENCOUNTER — Telehealth: Payer: Self-pay | Admitting: Internal Medicine

## 2017-07-08 MED ORDER — PLECANATIDE 3 MG PO TABS: 3.0000 mg | ORAL_TABLET | Freq: Every day | ORAL | 2 refills | Status: DC

## 2017-07-08 NOTE — Telephone Encounter (Signed)
Patient wife states medication samples of trulance have helped and pt would now like [facility].

## 2017-07-08 NOTE — Telephone Encounter (Signed)
Rx for Trulance sent to pharmacy.

## 2017-07-10 ENCOUNTER — Telehealth: Payer: Self-pay | Admitting: Internal Medicine

## 2017-07-10 NOTE — Telephone Encounter (Signed)
Dr Rhea Belton, I have spoken to pharmacy who states that Trulance is not covered, "not on formulary" (therefore I cannot even attempt prior authorization). Linzess is $111.72 monthly after prior authorization. Additional alternatives for this patient since we have only limited supplies of samples ???

## 2017-07-10 NOTE — Telephone Encounter (Signed)
Pt's wife calling regarding medications prescribed to pt: linzess and trulance, copay is too expensive. Wants to know if we have samples.

## 2017-07-14 NOTE — Telephone Encounter (Signed)
This is a difficult situation as I do not know how to get the Trulance approved for him I encouraged her to ask for an appeal from their insurance, I can provide documentation if needed We could also ask the drug rep if there is a program to help Medicare patients obtain this drug? Please explain to he and his wife what the situation is with their insurance and this medicine

## 2017-07-15 NOTE — Telephone Encounter (Signed)
I have spoken to Stephen Gentry to advise of the information below. She states that they have already paid this month for the Trulance and Linzess since it has been helping. She is not convinced that the Trulance is what is helping at this point because Stephen Gentry has been on so many medications to help with his bowels. However, she is having him to take the Trulance as least every other day. She states his worst problem right now is gas. He has tried multiple things over the counter including simethicone etc.   I advised that as I get samples of Linzess and Trulance, I will let them know so they can have them on hand. She thanked me and verbalizes understanding.

## 2017-07-17 ENCOUNTER — Telehealth: Payer: Self-pay | Admitting: Internal Medicine

## 2017-07-17 NOTE — Telephone Encounter (Signed)
Pt reports he has not had a BM in 3days however he states he is passing watery liquid along with mucous. Discussed with him that this ia BM but pt reports it is such a small amount, only about 1/4 cup. Pt also reports he is having a lot of gas and bloating along with abd discomfort. Pt states he cannot wait until August appt. Pt scheduled to see Amy Esterwood PA 07/22/17@3pm . Pt aware of appt.

## 2017-07-22 ENCOUNTER — Ambulatory Visit (INDEPENDENT_AMBULATORY_CARE_PROVIDER_SITE_OTHER): Payer: Medicare Other | Admitting: Physician Assistant

## 2017-07-22 ENCOUNTER — Encounter (INDEPENDENT_AMBULATORY_CARE_PROVIDER_SITE_OTHER): Payer: Self-pay

## 2017-07-22 ENCOUNTER — Encounter: Payer: Self-pay | Admitting: Physician Assistant

## 2017-07-22 VITALS — BP 100/60 | HR 72 | Ht 73.0 in | Wt 170.8 lb

## 2017-07-22 DIAGNOSIS — K5901 Slow transit constipation: Secondary | ICD-10-CM

## 2017-07-22 DIAGNOSIS — R140 Abdominal distension (gaseous): Secondary | ICD-10-CM

## 2017-07-22 MED ORDER — LINACLOTIDE 290 MCG PO CAPS: ORAL_CAPSULE | ORAL | 3 refills | Status: DC

## 2017-07-22 NOTE — Progress Notes (Addendum)
Subjective:    Patient ID: Stephen Gentry, male    DOB: 01-May-1934, 83 y.o.   MTAEDYN [id number]  HPI Jeno is a pleasant 83 year old white male, known to [name] with Parkinson's disease who has had a decline in cognitive function over the past couple of years.  He has been suffering from chronic constipation, abdominal bloating and gas which is been difficult to manage. He has had a couple of visits earlier this year.  Earlier this spring he was treated for SIBO-empirically with a course of Augmentin x10 days then a course of metronidazole and Keflex x10 days without any significant change in his symptoms. He has been continued on MiraLAX for constipation, cannot tolerate Amitiza.  He has been on Linzess now at 290 mcg daily which has been somewhat helpful.  After last office visit he was eventually started onTrulance in addition to the Linzess.  His wife is not sure how much difference this is made in addition to the Linzess.  He is able to have some bowel movement every day.  He has been eating okay but has had some gradual loss of weight.  Continues to complain of uncomfortable abdominal gas and some bloating on a daily basis.  He says sometimes he wakes up with this early in the morning and it may take him 2 to 3 hours to be able to pass a lot of gas.  Once he has a lot of flatus he will feel better until it builds up again.  He is taking Phazyme once daily very He does not feel that he has any lactose intolerance, not using any artificial sweeteners. Unfortunately the Truance will not be covered by insurance and its costing them over $400 a month.  Review of Systems Pertinent positive and negative review of systems were noted in the above HPI section.  All other review of systems was otherwise negative.  Outpatient Encounter Medications as of 07/22/2017  Medication Sig  . carbidopa-levodopa (SINEMET IR) 25-100 MG tablet 2 at 6am/1 tablet 4 other times in day as directed  . cholecalciferol (VITAMIN  D) 400 units TABS tablet Take 400 Units by mouth.  . [facility] VITAMIN B-12 500 MCG tablet   . cyanocobalamin (,VITAMIN B-12,) 1000 MCG/ML injection Inject 1,000 mcg into the muscle every 30 (thirty) days.  [name] escitalopram (LEXAPRO) 20 MG tablet Take 1 tablet (20 mg total) by mouth daily.  [name] linaclotide (LINZESS) 290 MCG CAPS capsule Take 1 tablet by mouth once daily.  [name] Plecanatide (TRULANCE) 3 MG TABS Take 3 mg by mouth daily.  . [DISCONTINUED] linaclotide (LINZESS) 290 MCG CAPS capsule Take 1 capsule (290 mcg total) by mouth daily before breakfast.   No facility-administered encounter medications on file as of 07/22/2017.    Allergies  Allergen Reactions  . Tetracyclines & Related Anaphylaxis  . Amitiza [Lubiprostone] Nausea Only   Patient Active Problem List   Diagnosis Date Noted  . Constipation 09/25/2016  . Anxiety 04/24/2016  . 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 Vitamin deficiency 02/19/2015  . Memory loss 02/19/2015  . Vitamin D deficiency 02/19/2015  . Other fatigue 02/19/2015  . Mild depression (HCC) 08/18/2014  . Disturbed cognition 08/18/2014  . Snoring 08/18/2014  . Idiopathic Parkinson's disease (HCC) 02/07/2014  . Parkinson's disease (HCC) 02/07/2014   Social History   Socioeconomic History  . Marital status: Married    Spouse name: Not on file  . Number of children: Not on file  . Years of  education: Not on file  . Highest education level: Not on file  Occupational History  . Occupation: retired    Comment: Public house manager  . 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 Financial resource strain: Not on file  . Food insecurity:    Worry: Not on file    Inability: Not on file  . Transportation needs:    Medical: Not on file    Non-medical: Not on file  Tobacco Use  . Smoking status: Former Smoker    Last attempt to quit: 10/23/1956    Years since quitting: 60.7  . Smokeless tobacco: Never Used  Substance and  Sexual Activity  . Alcohol use: Not Currently    Alcohol/week: 0.0 oz    Comment: rare glass of wine  . Drug use: No  . Sexual activity: Not on file  Lifestyle  . Physical activity:    Days per week: Not on file    Minutes per session: Not on file  . Stress: Not on file  Relationships  . Social connections:    Talks on phone: Not on file    Gets together: Not on file    Attends religious service: Not on file    Active member of club or organization: Not on file    Attends meetings of clubs or organizations: Not on file    Relationship status: Not on file  . Intimate partner violence:    Fear of current or ex partner: Not on file    Emotionally abused: Not on file    Physically abused: Not on file    Forced sexual activity: Not on file  Other Topics Concern  . Not on file  Social History Narrative  . Not on file    Mr. Nong family history includes Lung cancer in his father and mother.      Objective:    Vitals:   07/22/17 1508  BP: 100/60  Pulse: 72    Physical Exam; well-developed elderly white male, in no acute distress, accompanied by his wife blood pressure 100/60 pulse 72, height 6 foot 1, weight 170, BMI 22.5.  HEENT; parkinsonian facies, sclera anicteric, Oropharynx clear.  Cardiovascular; regular rate and rhythm with S1-S2 no murmur rub or gallop.  Pulmonary; clear bilaterally, Abdomen; soft, nondistended nontender bowel sounds are active to hyperactive no palpable mass or hepatosplenomegaly.  Rectal; exam not done, Extremities; no clubbing cyanosis or edema skin warm and dry, Neuro psych ;alert and oriented, pill-rolling type tremor, he is able to ambulate slowly mood and affect appropriate       Assessment & Plan:   #41 83 year old white male with advanced Parkinson's who has developed chronic severe constipation and painful gas and bloating. No clear improvement after treatment for small intestinal bacterial overgrowth earlier this spring. He has had  some improvement with Linzess 290 mcg daily  Constipation is being aggravated by Parkinson's disease.  #2 depression   Plan; Low gas diet, they were provided with a copy of this and will eliminate any high gas foods he is taking and on a daily basis. Plan to continue Linzess 290 mcg p.o. Daily Add back MiraLAX 17 g in 8 ounces of water every day, if he does not have a good bowel movement in 2 days then double up to twice daily.  His wife was advised to alter the regimen as needed Doreatha Martin the current prescription for Trulance and then discontinue as this is prohibitively expensive may not  be adding much to current regimen  They will try Dulcolax or glycerin suppositories every morning-hoping that rectal stimulation may help him evacuate trapped gas. They will call back with an update in 2 weeks. He may be a candidate for trial of Motegrity in the future, doubt we will be able to get this covered for him at present.    Amy S Esterwood PA-C 07/22/2017   Cc: [name], MD   Addendum: Reviewed and agree with ongoing management. Pyrtle, Carie Caddy, MD

## 2017-07-22 NOTE — Patient Instructions (Signed)
If you are age 81 or older, your body mass index should be between 23-30. Your Body mass index is 22.53 kg/m. If this is out of the aforementioned range listed, please consider follow up with your Primary Care Provider.  Low Gas diet handout has been provided.  Stop Trulance after you have finished the current prescription. Continue Linzess 290 mcg- 1 tablet daily. Take Miralax 17 grams in 8 oz of water every day-double up as needed. Try Dulcolax or glycerin suppositories every morning to stimulate bowel movements.

## 2017-08-04 ENCOUNTER — Telehealth: Payer: Self-pay | Admitting: Neurology

## 2017-08-04 NOTE — Telephone Encounter (Signed)
Patient's wife called and needs to speak with you regarding Trapper. She said he has been extremely fatigued lately. She would like to speak with you. Please Call. Thanks

## 2017-08-04 NOTE — Telephone Encounter (Signed)
Spoke with patient's wife. She states he is having extreme fatigue in the last two weeks, gets tired while shaving and has to take a break. It looks like Selegiline had been discussed in the past, but they wanted to hold. They are interested now. Then wife started discussing concerns with Levodopa not helping, his tremor and slowness getting worse. Appt made to discuss various issues with Dr. Arbutus Leas as there was a cancellation on her schedule this Thursday.  Dr. Arbutus Leas Lorain Childes.
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
## 2017-08-06 ENCOUNTER — Other Ambulatory Visit: Payer: Medicare Other

## 2017-08-06 ENCOUNTER — Ambulatory Visit (INDEPENDENT_AMBULATORY_CARE_PROVIDER_SITE_OTHER): Payer: Medicare Other | Admitting: Neurology

## 2017-08-06 ENCOUNTER — Encounter: Payer: Self-pay | Admitting: Neurology

## 2017-08-06 VITALS — Ht 73.0 in | Wt 170.0 lb

## 2017-08-06 DIAGNOSIS — E538 Deficiency of other specified B group vitamins: Secondary | ICD-10-CM | POA: Diagnosis not present

## 2017-08-06 DIAGNOSIS — G20A1 Parkinson's disease without dyskinesia, without mention of fluctuations: Secondary | ICD-10-CM

## 2017-08-06 DIAGNOSIS — R251 Tremor, unspecified: Secondary | ICD-10-CM | POA: Diagnosis not present

## 2017-08-06 DIAGNOSIS — Z5181 Encounter for therapeutic drug level monitoring: Secondary | ICD-10-CM | POA: Diagnosis not present

## 2017-08-06 DIAGNOSIS — R5383 Other fatigue: Secondary | ICD-10-CM | POA: Diagnosis not present

## 2017-08-06 DIAGNOSIS — G20 Parkinson's disease: Secondary | ICD-10-CM

## 2017-08-06 NOTE — Patient Instructions (Addendum)
1. Your provider has requested that you have labwork completed today. Please go to [facility] ([id number]) on the second floor of this building before leaving the office today. You do not need to check in. If you are not called within 15 minutes please check with the front desk.   2. We have you scheduled for your on/off test on 12/10/2017 at 2:30 pm. Please do not take your Parkinson's medications 24 hours prior to this date/time.

## 2017-08-07 ENCOUNTER — Telehealth: Payer: Self-pay | Admitting: Neurology

## 2017-08-07 LAB — CBC WITH DIFFERENTIAL/PLATELET
BASOS ABS: 39 {cells}/uL (ref 0–200)
Basophils Relative: 0.9 %
EOS PCT: 1.2 %
Eosinophils Absolute: 52 cells/uL (ref 15–500)
HEMATOCRIT: 39.7 % (ref 38.5–50.0)
Hemoglobin: 14.1 g/dL (ref 13.2–17.1)
LYMPHS ABS: 1664 {cells}/uL (ref 850–3900)
MCH: 31.2 pg (ref 27.0–33.0)
MCHC: 35.5 g/dL (ref 32.0–36.0)
MCV: 87.8 fL (ref 80.0–100.0)
MPV: 10.2 fL (ref 7.5–12.5)
Monocytes Relative: 11.8 %
NEUTROS PCT: 47.4 %
Neutro Abs: 2038 cells/uL (ref 1500–7800)
Platelets: 140 10*3/uL (ref 140–400)
RBC: 4.52 10*6/uL (ref 4.20–5.80)
RDW: 12.7 % (ref 11.0–15.0)
Total Lymphocyte: 38.7 %
WBC: 4.3 10*3/uL (ref 3.8–10.8)
WBCMIX: 507 {cells}/uL (ref 200–950)

## 2017-08-07 LAB — URINE CULTURE
MICRO NUMBER:: 90652436
Result:: NO GROWTH
SPECIMEN QUALITY:: ADEQUATE

## 2017-08-07 LAB — TSH: TSH: 2.39 m[IU]/L (ref 0.40–4.50)

## 2017-08-07 LAB — COMPREHENSIVE METABOLIC PANEL
AG Ratio: 1.9 (calc) (ref 1.0–2.5)
ALT: 9 U/L (ref 9–46)
AST: 14 U/L (ref 10–35)
Albumin: 4.1 g/dL (ref 3.6–5.1)
Alkaline phosphatase (APISO): 64 U/L (ref 40–115)
BUN: 18 mg/dL (ref 7–25)
CO2: 25 mmol/L (ref 20–32)
Calcium: 9.1 mg/dL (ref 8.6–10.3)
Chloride: 100 mmol/L (ref 98–110)
Creat: 0.82 mg/dL (ref 0.70–1.11)
Globulin: 2.2 g/dL (calc) (ref 1.9–3.7)
Glucose, Bld: 90 mg/dL (ref 65–99)
Potassium: 4.1 mmol/L (ref 3.5–5.3)
Sodium: 137 mmol/L (ref 135–146)
Total Bilirubin: 0.5 mg/dL (ref 0.2–1.2)
Total Protein: 6.3 g/dL (ref 6.1–8.1)

## 2017-08-07 LAB — URINALYSIS
BILIRUBIN URINE: NEGATIVE
GLUCOSE, UA: NEGATIVE
HGB URINE DIPSTICK: NEGATIVE
KETONES UR: NEGATIVE
LEUKOCYTES UA: NEGATIVE
Nitrite: NEGATIVE
PH: 7 (ref 5.0–8.0)
PROTEIN: NEGATIVE
Specific Gravity, Urine: 1.016 (ref 1.001–1.03)

## 2017-08-07 LAB — VITAMIN B12: Vitamin B-12: 546 pg/mL (ref 200–1100)

## 2017-08-07 MED ORDER — CARBIDOPA-LEVODOPA ER 25-100 MG PO TBCR: EXTENDED_RELEASE_TABLET | ORAL | 0 refills | Status: DC

## 2017-08-07 NOTE — Telephone Encounter (Signed)
Wife made aware of results and is agreeable to CR Levodopa. RX sent to the pharmacy.

## 2017-08-07 NOTE — Telephone Encounter (Signed)
-----   Message from Octaviano Batty Tat, DO sent at 08/07/2017  7:26 AM EDT ----- I have reviewed all lab results which are normal or stable. Please inform the patient/wife and they should change all dosages to carbidopa/levodopa 25/100 CR instead of IR.  Pt will need new RX for this (6 per day I believe)

## 2017-08-11 ENCOUNTER — Telehealth: Payer: Self-pay | Admitting: Neurology

## 2017-08-11 NOTE — Telephone Encounter (Signed)
Stephen HarmanDana- I assume they are calling you back to schedule this?

## 2017-08-11 NOTE — Telephone Encounter (Signed)
Stephen EvansCatherine left a VM message asking for a call back regarding the on/off test for parkinson's medicine

## 2017-08-11 NOTE — Telephone Encounter (Signed)
I got them scheduled
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
## 2017-08-13 ENCOUNTER — Encounter: Payer: Self-pay | Admitting: Neurology

## 2017-08-13 ENCOUNTER — Ambulatory Visit (INDEPENDENT_AMBULATORY_CARE_PROVIDER_SITE_OTHER): Payer: Medicare Other | Admitting: Neurology

## 2017-08-13 VITALS — BP 116/66 | HR 78 | Ht 74.0 in | Wt 169.0 lb

## 2017-08-13 DIAGNOSIS — R5383 Other fatigue: Secondary | ICD-10-CM | POA: Diagnosis not present

## 2017-08-13 DIAGNOSIS — K5900 Constipation, unspecified: Secondary | ICD-10-CM

## 2017-08-13 DIAGNOSIS — G20 Parkinson's disease: Secondary | ICD-10-CM | POA: Diagnosis not present

## 2017-08-13 MED ORDER — CARBIDOPA-LEVODOPA ER 25-100 MG PO TBCR: EXTENDED_RELEASE_TABLET | ORAL | 1 refills | Status: DC

## 2017-08-13 MED ORDER — CARBIDOPA-LEVODOPA 25-100 MG PO TABS
3.0000 | ORAL_TABLET | Freq: Once | ORAL | Status: AC
  Administered 2017-08-13: 3 via ORAL

## 2017-08-13 NOTE — Patient Instructions (Addendum)
Take carbidopa/levodopa 25/100 CR, 2 tablets three times per day, 1 at bed, 1 at 2 am

## 2017-09-03 ENCOUNTER — Other Ambulatory Visit: Payer: Self-pay | Admitting: Neurology

## 2017-10-08 ENCOUNTER — Encounter

## 2017-10-08 ENCOUNTER — Encounter: Payer: Self-pay | Admitting: Internal Medicine

## 2017-10-08 ENCOUNTER — Ambulatory Visit (INDEPENDENT_AMBULATORY_CARE_PROVIDER_SITE_OTHER): Payer: Medicare Other | Admitting: Internal Medicine

## 2017-10-08 VITALS — BP 120/60 | HR 82 | Ht 73.0 in | Wt 170.0 lb

## 2017-10-08 DIAGNOSIS — R141 Gas pain: Secondary | ICD-10-CM | POA: Diagnosis not present

## 2017-10-08 DIAGNOSIS — K5901 Slow transit constipation: Secondary | ICD-10-CM | POA: Diagnosis not present

## 2017-10-08 DIAGNOSIS — R140 Abdominal distension (gaseous): Secondary | ICD-10-CM | POA: Diagnosis not present

## 2017-10-08 MED ORDER — LINACLOTIDE 290 MCG PO CAPS: ORAL_CAPSULE | ORAL | 3 refills | Status: DC

## 2017-10-08 NOTE — Progress Notes (Signed)
Subjective:    Patient ID: Stephen ProwsCarl F Gentry, male    DOB: 04/08/1934, 83 y.o.   MRN: [id number]  HPI Stephen LinksCarl Blatchford is an 83 yo male with PMH of slow-transit constipation, gas/bloating, Parkinson's disease who is here for follow-up.  He is with his wife today and was last seen on 07/22/2017 by Mike GipAmy Esterwood, PA-C.    He reports that he is still having issues with abdominal bloating and build up of gas.  This is uncomfortable and can be painful for him.  He is taking Linzess 290 mcg daily.  Using Align once daily.  Taking MiraLAX almost daily but will skip if his stools are watery.  He reports some days he will pass only small amounts of liquid type stool with very little fecal matter.  On these days he will feel the abdominal pressure build up until he becomes uncomfortable.  He has been using periodic saline enemas which helped him pass solid and liquid stool and gives him near immediate relief of his abdominal pressure and discomfort.  He is not using these on a regular basis.  He is not having lower GI bleeding or blood in his stool or melena.  He is concerned about a bowel obstruction though he has always been able to pass gas, stool either liquid or solid and has not had  nausea or vomiting.  His wife states that he can become quite anxious when he does not have regular bowel movements.  In the past he did try Augmentin followed by metronidazole and Keflex which helped initially but lost efficacy quickly.  Trulance was tried in the past but was cost prohibitive and in retrospect he does not feel worked any better than Linzess.  He did have a colonoscopy on 02/07/2016 which was normal except for diverticulosis and internal hemorrhoids. He has had prior cross-sectional imaging CT scan abd/pelvis on multiple occasions.   Review of Systems As per HPI, otherwise negative  Current Medications, Allergies, Past Medical History, Past Surgical History, Family History and Social History were reviewed in  [facility] electronic [facility] record.     Objective:   Physical Exam BP 120/60   Pulse 82   Ht 6\' 1"  (1.854 m)   Wt 170 lb (77.1 kg)   BMI 22.43 kg/m  Constitutional: Well-developed and well-nourished. No distress. HEENT: Normocephalic and atraumatic.  No scleral icterus. Neck: Neck supple. Trachea midline. Cardiovascular: Normal rate, regular rhythm and intact distal pulses. No M/R/G Pulmonary/chest: Effort normal and breath sounds normal. No wheezing, rales or rhonchi. Abdominal: Soft, nontender, nondistended. Bowel sounds active throughout.  Extremities: no clubbing, cyanosis, or edema Neurological: Alert and oriented to person place and time. Skin: Skin is warm and dry. Psychiatric: Normal mood and flat affect. Behavior is normal.     Assessment & Plan:  83 yo male with PMH of slow-transit constipation, gas/bloating, Parkinson's disease who is here for follow-up.   1.  Slow transit constipation/abdominal gas with bloating --he has chronic constipation and very likely has a gut micro-biome which is high methane producing.  I am going to keep him on Linzess 290 mcg daily.  I am going to have him use saline enemas every other day to try to initiate more effective and complete bowel movement and hopefully avoid the buildup of abdominal gas that leads to pain.  He can still use MiraLAX 1-2 times daily as needed.  He prefers to stay on the align 1 capsule daily.  2.  Parkinson's --on carbidopa  levodopa and seeing Dr. Arbutus Leas  25 minutes spent with the patient today. Greater than 50% was spent in counseling and coordination of care with the patient

## 2017-10-08 NOTE — Patient Instructions (Signed)
We have sent the following medications to your pharmacy for you to pick up at your convenience: Linzess 290 mcg daily.  Please purchase the following medications over the counter and take as directed: Miralax 17 grams daily as needed Saline Enema per rectum once every other day.  Continue Align 1 capsule daily.  Please follow up with Dr Rhea BeltonPyrtle in 3 months.  If you are age 81 or older, your body mass index should be between 23-30. Your Body mass index is 22.43 kg/m. If this is out of the aforementioned range listed, please consider follow up with your Primary Care Provider.  If you are age 564 or younger, your body mass index should be between 19-25. Your Body mass index is 22.43 kg/m. If this is out of the aformentioned range listed, please consider follow up with your Primary Care Provider.

## 2017-10-12 ENCOUNTER — Encounter: Payer: Self-pay | Admitting: Psychology

## 2017-10-12 ENCOUNTER — Ambulatory Visit (INDEPENDENT_AMBULATORY_CARE_PROVIDER_SITE_OTHER): Payer: Medicare Other | Admitting: Psychology

## 2017-10-12 ENCOUNTER — Ambulatory Visit: Payer: Medicare Other | Admitting: Psychology

## 2017-10-12 DIAGNOSIS — R5383 Other fatigue: Secondary | ICD-10-CM

## 2017-10-12 DIAGNOSIS — G20 Parkinson's disease: Secondary | ICD-10-CM | POA: Diagnosis not present

## 2017-10-12 DIAGNOSIS — R413 Other amnesia: Secondary | ICD-10-CM

## 2017-10-12 DIAGNOSIS — G3184 Mild cognitive impairment, so stated: Secondary | ICD-10-CM

## 2017-10-12 NOTE — Progress Notes (Signed)
NEUROBEHAVIORAL STATUS EXAM   Name: DASHEL GOINES Date of Birth: [birth year] Date of Interview: 10/12/2017  Reason for Referral:  WILL HEINKEL is a 83 y.o. male who is referred for neuropsychological re-evaluation by Dr. Lurena Joiner Tat of [facility] due to concerns about worsening memory in the context of PD. This patient is accompanied in the office by his wife who supplements the history.  History of Presenting Problem [10/27/2016]: Mr. Strege was diagnosed with Parkinson's disease in early 2015. He has been treated by two neurologists previously but recently transitioned care to Dr. Arbutus Leas and was seen by her on 10/23/2016. The patient and his wife have noticed memory decline over the past few years, onset coinciding with diagnosis of PD, mildly progressing over time. They report more difficulty with retrieval of information; on one occasion recently he could not recall his address when asked for it. He also reports more difficulty recalling recent events and what he read the night before, more difficulty concentrating, and reduced sense of direction when traveling outside of his town. He denies any difficulty with managing appointments, medications, or finances (his wife has always handled most of the finances). He drives locally in [place] only and does not have any trouble with that. They deny slowed processing speed and word finding difficulty. There is no known family history of dementia or PD.  The patient and his wife reported dramatic decline in physical functioning in early July of this year. Specifically, they report significantly reduced energy and stamina. He is much more lethargic and tires very easily/quickly. He does not nap but has to sit and rest frequently. He is watching much more TV. He does not have any sleep difficulty at night but his sleep is interrupted by somewhat frequent urination (4-5 times last night, usually 2). He is hydrating well with water. He has issues with  constipation and bloating. His appetite is reduced, and things do not taste as good to him. He has lost about 5 lbs unintentionally. He eats a healthy diet. He will be starting [employer] this week. He does not have regular falls; he has a history of only one fall and this seemed to be due to new glasses he was wearing at the time. He has possible visual illusions (seeing faces in patterned stimuli like patterned carpet or artwork) but no frank hallucinations.   His mood is overall very mellow per his wife. She also states he is "very sweet, considerate and grateful." He has reduced drive and energy to engage in tasks, and states it's harder to get organized or concentrate enough to carry out more complex tasks like his hobby of restoring and selling motorcycles. He has been experiencing episodes of acute anxiety, about 1-2x every 2-3 weeks. Dr. Arbutus Leas noted this could be dopamine dependent. He also experiences some depressed mood. His PCP prescribed Zoloft 3-4 years ago and it was changed to Lexapro 10 mg which he continues to take. He denies suicidal ideation or intention but has been thinking more about implications of worsening PD on quality of life.  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 Results of testing revealed many areas of cognitive functioning that are within normal limits for age, including psychomotor processing speed, attention/working memory, language, visual spatial skills and executive functioning. Meanwhile, he did demonstrate impairments in encoding/retrieval of new information, particularly auditory information. Recognition memory was intact. This pattern is suggestive of disruption of fronto-subcortical networks.  His cognitive profile is not indicative of dementia but instead warrants a diagnosis of mild cognitive impairment. Difficulties with encoding and retrieval of new information are  common in Parkinson's disease and I suspect this is the etiology. The patient did not endorse significant depression or anxiety at the present time but I understand his anxiety has been an issue recently and he is likely having some adjustment-related depression (related to declining physical health).     Interim History and Current Functioning [10/12/2017]: Mr. [name] continues to be followed by Dr. Arbutus Leasat for Parkinson's disease. He was last seen by Dr. Arbutus Leasat on 08/13/2017 for on/off testing (levodopa challenge test). This showed definite efficacy to levodopa.   The patient reports that he continues to notice short term memory difficulty, but long term memory remains strong. His wife agrees that short term memory is an issue. She notes that he used to be fabulous with directions but now he does not remember how to get to places he used to know well. She is doing all the driving now. She also reminds him to take his medications, and she manages the calendar/appointments. He is able to read the calendar and track his appointments, however. His wife always did the household finances, but she also had to take over his personal checkbook because he was not keeping track of the balance.   He is mostly bothered by fatigue/lethargy and GI issues. He is participating in [employer].  He has not had any hallucinations. He has not had any falls. He generally sleeps well. His appetite is pretty good.   When asked about mood, he reports that he gets "perterbed" with himself, that he cannot do certain things. It bothers him that he is so much less active than he used to be. He endorses some nervousness, but Lexapro does seem to help with this. He is frustrated by PD and its effect on his life. He notes he wishes he could "snap [his] fingers and this would all go away".  He denies suicidal ideation or intention.   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 He retired in 1998. Marital history: He has been married to his current wife for 31 years.He has 2 children, she has 1 child. They have 4 grandchildren.  Alcohol: Used to enjoy wine, no taste for it anymore, hasn't had any alcohol in at least a year Tobacco: Former smoker, quit in 1958 per records SA: None   Medical History: Past Medical History:  Diagnosis Date  . Baker's cyst    right knee  . Chronic constipation   . Diverticulosis   . Internal hemorrhoids   . Parkinson's disease (HCC)   . Vision abnormalities       Current Medications:  Outpatient Encounter Medications as of 10/12/2017  Medication Sig  . Carbidopa-Levodopa ER (SINEMET CR) 25-100 MG tablet controlled release 2 tabs tid, 1 at bed, 1 at 2 am  . cholecalciferol (VITAMIN D) 400 units TABS tablet Take 400 Units by mouth.  . cyanocobalamin (,VITAMIN B-12,) 1000 MCG/ML injection Inject 1,000 mcg into the muscle every 30 (thirty) days.  [name]. escitalopram (LEXAPRO) 20 MG tablet Take 1 tablet (20 mg total) by mouth daily.  [name]. linaclotide (LINZESS) 290 MCG CAPS capsule Take 1 tablet by mouth once daily.  .[name]  polyethylene glycol (MIRALAX / GLYCOLAX) packet Take 17 g by mouth daily.  . Probiotic Product (ALIGN PO) Take by mouth.   No facility-administered encounter medications on file as of 10/12/2017.      Behavioral Observations:   Appearance: Neatly, casually and appropriately dressed and groomed Gait: Ambulated independently Speech: Fluent; normal rate, rhythm and volume. No significant word finding difficulty. Thought process: Linear, goal directed Affect: Masked Interpersonal: Pleasant, appropriate   30 minutes spent face-to-face with patient completing neurobehavioral status exam. 30 minutes spent integrating medical records/clinical data and completing this  report. [id number] unit.   TESTING: There is medical necessity to proceed with neuropsychological assessment as the results will be used to aid in differential diagnosis and clinical decision-making and to inform specific treatment recommendations. Per the patient, his wife and medical records reviewed, there has been a change in cognitive functioning and a reasonable suspicion of MCI due to PD or progression to dementia.  Clinical Decision Making: In considering the patient's current level of functioning, level of presumed impairment, nature of symptoms, emotional and behavioral responses during the interview, level of literacy, and observed level of motivation, a battery of tests was selected and communicated to the psychometrician.   Following the clinical interview/neurobehavioral status exam, the patient completed this full battery of neuropsychological testing with my psychometrician under my supervision (see separate note).   PLAN: The patient will return to see me for a follow-up session at which time his test performances and my impressions and treatment recommendations will be reviewed in detail.  Evaluation ongoing; full report to follow.

## 2017-10-12 NOTE — Progress Notes (Signed)
   Neuropsychology Note  Stephen Gentry completed 75 minutes of neuropsychological testing with technician, [name], BS, under the supervision of Dr. Elvis CoilMaryBeth Bailar, Licensed Psychologist. The patient did not appear overtly distressed by the testing session, per behavioral observation or via self-report to the technician. Rest breaks were offered.   Clinical Decision Making: In considering the patient's current level of functioning, level of presumed impairment, nature of symptoms, emotional and behavioral responses during the interview, level of literacy, and observed level of motivation/effort, a battery of tests was selected and communicated to the psychometrician.  Communication between the psychologist and technician was ongoing throughout the testing session and changes were made as deemed necessary based on patient performance on testing, technician observations and additional pertinent factors such as those listed above.  Stephen Gentry will return within approximately 2 weeks for an interactive feedback session with Dr. Alinda DoomsBailar at which time his test performances, clinical impressions and treatment recommendations will be reviewed in detail. The patient understands he can contact our office should he require our assistance before this time.  35 minutes spent performing neuropsychological evaluation services/clinical decision making (psychologist). [CPT 96132] 75 minutes spent face-to-face with patient administering standardized tests, 30 minutes spent scoring (technician). [CPT [id number], 96139]  Full report to follow.

## 2017-10-13 ENCOUNTER — Encounter: Payer: Self-pay | Admitting: Psychology

## 2017-10-31 ENCOUNTER — Other Ambulatory Visit: Payer: Self-pay | Admitting: Neurology

## 2017-11-02 NOTE — Progress Notes (Signed)
NEUROPSYCHOLOGICAL RE-EVALUATION   Name:    Stephen Gentry  Date of Birth:   [birth year] Date of Interview:  10/12/2017 Date of Testing:  10/12/2017   Date of Feedback:  11/03/2017       Background Information:  Reason for Referral:  Stephen Gentry is a 83 y.o. male referred by Dr. Lurena Joiner Tat to assess his current level of cognitive functioning and assist in differential diagnosis. The current evaluation consisted of a review of available medical records, an interview with the patient and his wife, and the completion of a neuropsychological testing battery. Informed consent was obtained.  History of Presenting Problem [10/27/2016]: Stephen Gentry was diagnosed with Parkinson's disease in early 2015. He has been treated by two neurologists previously but recently transitioned care to Dr. Arbutus Leas and was seen by her on 10/23/2016. The patient and his wife have noticed memory decline over the past few years, onset coinciding with diagnosis of PD, mildly progressing over time. They report more difficulty with retrieval of information; on one occasion recently he could not recall his address when asked for it. He also reports more difficulty recalling recent events and what he read the night before, more difficulty concentrating, and reduced sense of direction when traveling outside of his town. He denies any difficulty with managing appointments, medications, or finances (his wife has always handled most of the finances). He drives locally in [place] only and does not have any trouble with that. They deny slowed processing speed and word finding difficulty. There is no known family history of dementia or PD.  The patient and his wife reported dramatic decline in physical functioning in early July of this year. Specifically, they report significantly reduced energy and stamina. He is much more lethargic and tires very easily/quickly. He does not nap but has to sit and rest frequently. He is watching much more TV. He  does not have any sleep difficulty at night but his sleep is interrupted by somewhat frequent urination (4-5 times last night, usually 2). He is hydrating well with water. He has issues with constipation and bloating. His appetite is reduced, and things do not taste as good to him. He has lost about 5 lbs unintentionally. He eats a healthy diet. He will be starting [employer] this week. He does not have regular falls; he has a history of only one fall and this seemed to be due to new glasses he was wearing at the time. He has possible visual illusions (seeing faces in patterned stimuli like patterned carpet or artwork) but no frank hallucinations.   His mood is overall very mellow per his wife. She also states he is "very sweet, considerate and grateful." He has reduced drive and energy to engage in tasks, and states it's harder to get organized or concentrate enough to carry out more complex tasks like his hobby of restoring and selling motorcycles. He has been experiencing episodes of acute anxiety, about 1-2x every 2-3 weeks. Dr. Arbutus Leas noted this could be dopamine dependent. He also experiences some depressed mood. His PCP prescribed Zoloft 3-4 years ago and it was changed to Lexapro 10 mg which he continues to take. He denies suicidal ideation or intention but has been thinking more about implications of worsening PD on quality of life.  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 Results of testing revealed many areas of cognitive functioning that are within normal limits for age, including psychomotor processing  speed, attention/working memory, language, visual spatial skills and executive functioning. Meanwhile, he did demonstrate impairments in encoding/retrieval of new information, particularly auditory information. Recognition memory was intact. This pattern is suggestive of disruption of  fronto-subcortical networks. His cognitive profile is not indicative of dementia but instead warrants a diagnosis of mild cognitive impairment. Difficulties with encoding and retrieval of new informationarecommon in Parkinson's disease and I suspect this is the etiology. The patient did not endorse significant depression or anxiety at the present time but I understand his anxiety has been an issue recently and he is likely having some adjustment-related depression (related to declining physical health).    Interim History and Current Functioning [10/12/2017]: Stephen Gentry continues to be followed by Dr. Arbutus Leas for Parkinson's disease. He was last seen by Dr. Arbutus Leas on 08/13/2017 for on/off testing (levodopa challenge test). This showed definite efficacy to levodopa.   The patient reports that he continues to notice short term memory difficulty, but long term memory remains strong. His wife agrees that short term memory is an issue. She notes that he used to be fabulous with directions but now he does not remember how to get to places he used to know well. She is doing all the driving now. She also reminds him to take his medications, and she manages the calendar/appointments. He is able to read the calendar and track his appointments, however. His wife always did the household finances, but she also had to take over his personal checkbook because he was not keeping track of the balance.   He is mostly bothered by fatigue/lethargy and GI issues. He is participating in [employer].  He has not had any hallucinations. He has not had any falls. He generally sleeps well. His appetite is pretty good.   When asked about mood, he reports that he gets "perterbed" with himself, that he cannot do certain things. It bothers him that he is so much less active than he used to be. He endorses some nervousness, but Lexapro does seem to help with this. He is frustrated by PD and its effect on his life. He notes he  wishes he could "snap [his] fingers and this would all go away".  He denies suicidal ideation or intention.   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 Marital history: He has been married to his current wife for 31 years.He has 2 children, she has 1 child. They have 4 grandchildren.  Alcohol: Used to enjoy wine, no taste for it anymore, hasn't had any alcohol in at least a year Tobacco: Former smoker, quit in 1958 per records SA: None    Medical History:  Past Medical History:  Diagnosis Date  . Baker's cyst    right knee  . Chronic constipation   . Diverticulosis   . Internal hemorrhoids   . Parkinson's disease (HCC)   . Vision abnormalities     Current medications:  Outpatient Encounter Medications as of 11/03/2017  Medication Sig  . Carbidopa-Levodopa ER (SINEMET CR) 25-100 MG tablet controlled release 2 tabs tid, 1 at bed, 1 at 2 am  . cholecalciferol (VITAMIN D) 400 units TABS tablet Take 400 Units by mouth.  . cyanocobalamin (,VITAMIN B-12,) 1000 MCG/ML injection Inject 1,000 mcg into the muscle every 30 (thirty) days.  [name]  escitalopram (LEXAPRO) 20 MG tablet TAKE 1 TABLET BY MOUTH EVERY DAY  . linaclotide (LINZESS) 290 MCG CAPS capsule Take 1 tablet by mouth once daily.  . polyethylene glycol (MIRALAX / GLYCOLAX) packet Take 17 g by mouth daily.  . Probiotic Product (ALIGN PO) Take by mouth.   No facility-administered encounter medications on file as of 11/03/2017.      Current Examination:  Behavioral Observations:   Appearance: Neatly, casually and appropriately dressed and groomed Gait: Ambulated independently Speech: Fluent; normal rate, rhythm and volume. No significant word finding difficulty. Thought process: Linear, goal  directed Affect: Masked Interpersonal: Pleasant, appropriate Orientation: Oriented to all spheres. Accurately named the current President and his predecessor.    Tests Administered: . Test of Premorbid Functioning (TOPF) . Wechsler Adult Intelligence Scale-Fourth Edition (WAIS-IV): Similarities, Block Design, Matrix Reasoning, Coding and Digit Span subtests . Wechsler Memory Scale-Fourth Edition (WMS-IV) Older Adult Version (ages 23-90): Logical Memory I, II and Recognition subtests  . DIRECTV Verbal Learning Test - 2nd Edition (CVLT-2) Short Form . Repeatable Battery for the Assessment of Neuropsychological Status (RBANS) Form A:  Figure Copy and Recall subtests and Semantic Fluency subtest . Boston Naming Test (BNT) . Boston Diagnostic Aphasia Examination: Complex Ideational Material subtest . Controlled Oral Word Association Test (COWAT) . Trail Making Test A and B . Clock drawing test . Symbol Digit Modalities Test (SDMT) - Oral only . Beck Depression Inventory - 2nd edition (BDI-II) . Generalized Anxiety Disorder - 7 item screener (GAD-7) . Parkinson's Disease Questionnaire (PDQ-39)  Test Results: Note: Standardized scores are presented only for use by appropriately trained professionals and to allow for any future test-retest comparison. These scores should not be interpreted without consideration of all the information that is contained in the rest of the report. The most recent standardization samples from the test publisher or other sources were used whenever possible to derive standard scores; scores were corrected for age, gender, ethnicity and education when available.   Test Scores:  Test Name Raw Score Standardized Score Descriptor  TOPF 51/70 SS= 109 Average  WAIS-IV Subtests     Similarities 24/36 ss= 11 Average  Block Design 42/66 ss= 15 Superior  Matrix Reasoning 15/26 ss= 14 Superior  Coding 30/135 ss= 8 Low end of average  Digit Span Forward 8/16 ss= 8 Low end  of average  Digit Span Backward 6/16 ss= 8 Low end of average  WMS-IV Subtests     LM I 19/53 ss= 7 Low average  LM II 0/39 ss= 2 Impaired  LM II Recognition 11/23 Cum %: 3-9 Impaired  RBANS Subtests     Figure Copy 20/20 Z= 1.4 Superior  Figure Recall 11/20 Z= -0.1 Average  Semantic Fluency 17/40 Z= -0.1 Average  CVLT-II Scores     Trial 1 5/9 Z= 0.5 Average  Trial 4 4/9 Z= -2 Impaired  Trials 1-4 total 18/36 T= 44 Average  SD Free Recall 3/9 Z= -1.5 Borderline  LD Free Recall 0/9 Z= -1.5 Borderline  LD Cued Recall 3/9 Z= -1 Low average  Recognition Discriminability 7/9 hits 2  false positives Z= 0 Average  Forced Choice Recognition 9/9  WNL  BNT 57/60 T= 59 High average  BDAE Subtest     Complex Ideational Material 12/12  WNL  COWAT-FAS 35 T= 48 Average  COWAT-Animals 11 T= 38 Low average  Trail Making Test A  48" 0 errors T= 55 Average  Trail Making Test B  100" 0 errors T= 56 Average  Clock Drawing   WNL  SDMT - oral only 29/110 T= -1.8 Borderline  BDI-II 20/63  Moderate  GAD-7 8/21  Mild  PDQ-39     Mobility 40%    Activities of Daily Living 16.66%    Emotional Well Being 37.5%    Stigma 12.5%    Social Support 0    Cognitive Impairment 25%    Communication 0    Bodily Discomfort 0        Description of Test Results:  Premorbid verbal intellectual abilities were estimated to have been within the average to high average range based on a test of word reading. Psychomotor processing speed was average (stable). Auditory attention and working memory were average (stable). Visual-spatial construction was superior (stable). Language abilities were somewhat variable. Specifically, confrontation naming was high average (stable), and semantic verbal fluency was low average to average with some mild decline from last evaluation. Auditory comprehension of complex ideational material was intact. With regard to verbal memory, encoding and acquisition of non-contextual  information (i.e., word list) was average (stable). After a brief distracter task, free recall was borderline (3/9 items, stable). After a delay, free recall was borderline (0/9 items, stable). Cued recall was low average (3/9 items, stable). Performance on a yes/no recognition task was average with mild decline from last evaluation. On another verbal memory test, encoding and acquisition of contextual auditory information (i.e., short stories) was low average (stable). After a delay, free recall was impaired (mild decline from last evaluation). Performance on a yes/no recognition task was impaired (significant decline from last evaluation). With regard to non-verbal memory, delayed free recall of visual information was average (stable). Executive functioning was within normal limits overall. Mental flexibility and set-shifting were average (mild decline) on Trails B. Verbal fluency with phonemic search restrictions was average (stable). Verbal abstract reasoning was average (stable). Non-verbal abstract reasoning was superior (stable). Performance on a clock drawing task was intact (stable). On a self-report measure of mood, the patient's responses were indicative of clinically significant depression at the present time; last year, he was not reporting significant depression. Current symptoms endorsed included: sadness much of the time, pessimism, anhedonia, loss of self confidence, restlessness, loss of interest, indecisiveness, worthlessness, loss of energy, increased sleep, reduced appetite, concentration difficulty, fatigue, loss of libido. He denied suicidal ideation or intention. On a self-report measure of anxiety, the patient endorsed mild generalized anxiety at the present time, again increased from last evaluation. Symptoms of anxiety endorsed included: excessive worrying, nervousness, inability to control worry, difficulty relaxing, restlessness, irritability and fear of something awful happening. On a  self-report measure assessing the impact of PD symptoms on quality of life and daily functioning, he reported significant difficulty with mobility, as well as some difficulty with emotional wellbeing and cognitive impairment. He reported some decline in ADLs and some mild stigma associated with his illness/symptoms. He denied any problems with social support, communication or bodily discomfort.   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 Results of cognitive testing continue to show many areas of cognitive strength; however, there has been decline in a couple of areas on testing, and associated with this this, there has been decline in his ability to manage complex ADLs (driving, medications, appointments, finances). As such, it appears the patient's previously diagnosed MCI has converted to dementia at this point, and the patient now meets diagnostic criteria for formal diagnosis of dementia.  His cognitive profile continues to be notable for memory  retrieval deficits. However, he is now demonstrating decline in recognition memory which was not apparent on his last evaluation. Additionally, he demonstrated mild decline in semantic verbal fluency.  Meanwhile, visual-spatial construction, processing speed, attention, confrontation naming, and executive function remain quite strong on testing.  It should be noted that the patient's areas of decline in the past year, and his current cognitive profile, are more in line with Alzheimer's disease than PDD. It is impossible to definitively diagnose Alzheimer's rather than PDD, but I do think it is a strong possibility that the patient is developing AD superimposed on PD.  With regard to mood and emotional functioning, the patient is reporting increased depression and anxiety this year compared to last. This appeared to be related to his adjustment to chronic illness and functional decline.      Recommendations/Plan: Based on the findings of the present evaluation, the following recommendations are offered:  1. Continue assistance with all complex ADLs; he appears to have appropriate level of support. 2. May want to consider Aricept if not medically contraindicated. They will discuss with Dr. Arbutus Leas next week. 3. Continue engagement in enjoyable activities and social interaction in order to enhance mood and emotional adjustment. I am so glad he is doing RSD.  4. He is reporting more depression this year compared to last. He is on Lexapro 20 mg and this does seem to be helpful, but unclear if medication change is indicated, could consider referral to geriatric psychiatry to evaluate. 5. Continue dementia caregiver education and support for wife. 6. Recommend re-evaluation in another year to 18 mos.   Feedback to Patient: Stephen Gentry and his wife returned for a feedback appointment on 11/03/2017 to review the results of his neuropsychological evaluation with this provider. 30 minutes face-to-face time was spent reviewing his test results, my impressions and my recommendations as detailed above.    Total time spent on this patient's case: 60 minutes for neurobehavioral status exam with psychologist (CPT code 16109); 90 minutes of testing/scoring by psychometrician under psychologist's supervision (CPT codes [phone], [phone] units); 180 minutes for integration of patient data, interpretation of standardized test results and clinical data, clinical decision making, treatment planning and preparation of this report, and interactive feedback with review of results to the patient/family by psychologist (CPT codes [phone], [phone] units).      Thank you for your referral of Stephen Gentry. Please feel free to contact me if you have any questions or concerns regarding this report.

## 2017-11-03 ENCOUNTER — Encounter: Payer: Self-pay | Admitting: Psychology

## 2017-11-03 ENCOUNTER — Ambulatory Visit (INDEPENDENT_AMBULATORY_CARE_PROVIDER_SITE_OTHER): Payer: Medicare Other | Admitting: Psychology

## 2017-11-03 DIAGNOSIS — F0390 Unspecified dementia without behavioral disturbance: Secondary | ICD-10-CM

## 2017-11-03 DIAGNOSIS — G20 Parkinson's disease: Secondary | ICD-10-CM
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
## 2017-11-10 ENCOUNTER — Encounter: Payer: Self-pay | Admitting: Neurology

## 2017-11-10 ENCOUNTER — Ambulatory Visit (INDEPENDENT_AMBULATORY_CARE_PROVIDER_SITE_OTHER): Payer: Medicare Other | Admitting: Neurology

## 2017-11-10 VITALS — BP 90/60 | HR 88 | Ht 73.0 in | Wt 171.0 lb

## 2017-11-10 DIAGNOSIS — F0280 Dementia in other diseases classified elsewhere without behavioral disturbance: Secondary | ICD-10-CM

## 2017-11-10 DIAGNOSIS — G20 Parkinson's disease: Secondary | ICD-10-CM

## 2017-11-10 DIAGNOSIS — G301 Alzheimer's disease with late onset: Secondary | ICD-10-CM | POA: Diagnosis not present

## 2017-11-10 MED ORDER — DONEPEZIL HCL 10 MG PO TABS: 10.0000 mg | ORAL_TABLET | Freq: Every day | ORAL | 1 refills | Status: DC

## 2017-11-10 MED ORDER — DONEPEZIL HCL 5 MG PO TABS: 5.0000 mg | ORAL_TABLET | Freq: Every day | ORAL | 0 refills | Status: DC

## 2017-11-10 NOTE — Patient Instructions (Signed)
Start aricept (donepezil) 5 mg daily for a month and then 10 mg daily thereafter.

## 2017-12-03 ENCOUNTER — Telehealth: Payer: Self-pay | Admitting: Internal Medicine

## 2017-12-03 NOTE — Telephone Encounter (Signed)
Patient is passing liquid stool. Wife concerned that there is solid stool up higher that needs to come out. Discussed with her that he can try magnesium citrate, she verbalized understanding.

## 2017-12-04 ENCOUNTER — Other Ambulatory Visit: Payer: Self-pay | Admitting: Neurology

## 2017-12-10 ENCOUNTER — Ambulatory Visit: Payer: Medicare Other | Admitting: Neurology

## 2017-12-18 ENCOUNTER — Ambulatory Visit: Payer: Medicare Other | Admitting: Neurology

## 2018-01-02 ENCOUNTER — Other Ambulatory Visit: Payer: Self-pay | Admitting: Neurology

## 2018-01-04 ENCOUNTER — Telehealth: Payer: Self-pay | Admitting: Internal Medicine

## 2018-01-04 NOTE — Telephone Encounter (Signed)
Spoke with patient-pt reported taking Miralax (1 cap full) on Sat, which was also the last reported BM; pt reports PO intake of "4-5 full glasses of just water"; pt advised to increase PO intake, continue Miralax 2-3 x daily instead of once daily; pt advised to call back if symptoms worsen or no improvement;

## 2018-01-22 ENCOUNTER — Emergency Department (HOSPITAL_COMMUNITY): Payer: Medicare Other

## 2018-01-22 ENCOUNTER — Inpatient Hospital Stay (HOSPITAL_COMMUNITY)
Admission: EM | Admit: 2018-01-22 | Discharge: 2018-01-25 | DRG: 312 | Disposition: A | Discharge status: Home / Self Care | Payer: Medicare Other | Attending: Internal Medicine | Admitting: Internal Medicine

## 2018-01-22 ENCOUNTER — Other Ambulatory Visit: Payer: Self-pay

## 2018-01-22 ENCOUNTER — Encounter (HOSPITAL_COMMUNITY): Payer: Self-pay

## 2018-01-22 ENCOUNTER — Encounter: Payer: Self-pay | Admitting: Internal Medicine

## 2018-01-22 ENCOUNTER — Ambulatory Visit (INDEPENDENT_AMBULATORY_CARE_PROVIDER_SITE_OTHER): Payer: Medicare Other | Admitting: Internal Medicine

## 2018-01-22 VITALS — BP 60/38 | HR 77 | Ht 73.0 in | Wt 170.0 lb

## 2018-01-22 DIAGNOSIS — I4892 Unspecified atrial flutter: Secondary | ICD-10-CM | POA: Diagnosis present

## 2018-01-22 DIAGNOSIS — R140 Abdominal distension (gaseous): Secondary | ICD-10-CM

## 2018-01-22 DIAGNOSIS — Z8719 Personal history of other diseases of the digestive system: Secondary | ICD-10-CM

## 2018-01-22 DIAGNOSIS — I951 Orthostatic hypotension: Principal | ICD-10-CM | POA: Diagnosis present

## 2018-01-22 DIAGNOSIS — Z881 Allergy status to other antibiotic agents status: Secondary | ICD-10-CM

## 2018-01-22 DIAGNOSIS — K5901 Slow transit constipation: Secondary | ICD-10-CM

## 2018-01-22 DIAGNOSIS — I959 Hypotension, unspecified: Secondary | ICD-10-CM | POA: Diagnosis not present

## 2018-01-22 DIAGNOSIS — Z888 Allergy status to other drugs, medicaments and biological substances status: Secondary | ICD-10-CM

## 2018-01-22 DIAGNOSIS — Z87891 Personal history of nicotine dependence: Secondary | ICD-10-CM

## 2018-01-22 DIAGNOSIS — F0280 Dementia in other diseases classified elsewhere without behavioral disturbance: Secondary | ICD-10-CM | POA: Diagnosis present

## 2018-01-22 DIAGNOSIS — I491 Atrial premature depolarization: Secondary | ICD-10-CM | POA: Diagnosis present

## 2018-01-22 DIAGNOSIS — F419 Anxiety disorder, unspecified: Secondary | ICD-10-CM | POA: Diagnosis present

## 2018-01-22 DIAGNOSIS — K5909 Other constipation: Secondary | ICD-10-CM | POA: Diagnosis present

## 2018-01-22 DIAGNOSIS — G20 Parkinson's disease: Secondary | ICD-10-CM | POA: Diagnosis present

## 2018-01-22 DIAGNOSIS — K5900 Constipation, unspecified: Secondary | ICD-10-CM | POA: Diagnosis present

## 2018-01-22 DIAGNOSIS — G909 Disorder of the autonomic nervous system, unspecified: Secondary | ICD-10-CM | POA: Diagnosis present

## 2018-01-22 DIAGNOSIS — F05 Delirium due to known physiological condition: Secondary | ICD-10-CM | POA: Diagnosis not present

## 2018-01-22 DIAGNOSIS — Z79899 Other long term (current) drug therapy: Secondary | ICD-10-CM

## 2018-01-22 DIAGNOSIS — G20A1 Parkinson's disease without dyskinesia, without mention of fluctuations: Secondary | ICD-10-CM | POA: Diagnosis present

## 2018-01-22 LAB — CBC WITH DIFFERENTIAL/PLATELET
ABS IMMATURE GRANULOCYTES: 0 10*3/uL (ref 0.00–0.07)
Basophils Absolute: 0 10*3/uL (ref 0.0–0.1)
Basophils Relative: 1 %
Eosinophils Absolute: 0 10*3/uL (ref 0.0–0.5)
Eosinophils Relative: 1 %
HCT: 42.5 % (ref 39.0–52.0)
HEMOGLOBIN: 13.8 g/dL (ref 13.0–17.0)
Immature Granulocytes: 0 %
LYMPHS PCT: 27 %
Lymphs Abs: 0.9 10*3/uL (ref 0.7–4.0)
MCH: 30.6 pg (ref 26.0–34.0)
MCHC: 32.5 g/dL (ref 30.0–36.0)
MCV: 94.2 fL (ref 80.0–100.0)
MONO ABS: 0.4 10*3/uL (ref 0.1–1.0)
Monocytes Relative: 12 %
NEUTROS ABS: 1.9 10*3/uL (ref 1.7–7.7)
Neutrophils Relative %: 59 %
Platelets: 122 10*3/uL — ABNORMAL LOW (ref 150–400)
RBC: 4.51 MIL/uL (ref 4.22–5.81)
RDW: 13.1 % (ref 11.5–15.5)
WBC: 3.2 10*3/uL — AB (ref 4.0–10.5)
nRBC: 0 % (ref 0.0–0.2)

## 2018-01-22 LAB — COMPREHENSIVE METABOLIC PANEL
ALBUMIN: 3.8 g/dL (ref 3.5–5.0)
ALK PHOS: 59 U/L (ref 38–126)
ALT: 9 U/L (ref 0–44)
AST: 16 U/L (ref 15–41)
Anion gap: 7 (ref 5–15)
BUN: 23 mg/dL (ref 8–23)
CALCIUM: 9.1 mg/dL (ref 8.9–10.3)
CO2: 29 mmol/L (ref 22–32)
CREATININE: 0.81 mg/dL (ref 0.61–1.24)
Chloride: 104 mmol/L (ref 98–111)
GFR calc non Af Amer: >60 mL/min (ref >60)
GLUCOSE: 85 mg/dL (ref 70–99)
Potassium: 3.9 mmol/L (ref 3.5–5.1)
Sodium: 140 mmol/L (ref 135–145)
Total Bilirubin: 0.9 mg/dL (ref 0.3–1.2)
Total Protein: 6 g/dL — ABNORMAL LOW (ref 6.5–8.1)

## 2018-01-22 LAB — URINALYSIS, ROUTINE W REFLEX MICROSCOPIC
BILIRUBIN URINE: NEGATIVE
GLUCOSE, UA: NEGATIVE mg/dL
Hgb urine dipstick: NEGATIVE
Ketones, ur: 5 mg/dL — AB
Leukocytes, UA: NEGATIVE
Nitrite: NEGATIVE
PH: 6 (ref 5.0–8.0)
Protein, ur: NEGATIVE mg/dL
Specific Gravity, Urine: 1.012 (ref 1.005–1.030)

## 2018-01-22 LAB — I-STAT CG4 LACTIC ACID, ED: LACTIC ACID, VENOUS: 0.6 mmol/L (ref 0.5–1.9)

## 2018-01-22 LAB — CORTISOL: CORTISOL PLASMA: 11.7 ug/dL

## 2018-01-22 MED ORDER — DIPHENHYDRAMINE HCL 50 MG/ML IJ SOLN
25.0000 mg | Freq: Once | INTRAMUSCULAR | Status: AC | PRN
  Administered 2018-01-22: 25 mg via INTRAVENOUS
  Filled 2018-01-22: qty 1

## 2018-01-22 MED ORDER — SIMETHICONE 80 MG PO CHEW
80.0000 mg | CHEWABLE_TABLET | Freq: Once | ORAL | Status: AC
  Administered 2018-01-22: 80 mg via ORAL
  Filled 2018-01-22: qty 1

## 2018-01-22 MED ORDER — CARBIDOPA-LEVODOPA ER 25-100 MG PO TBCR
2.0000 | EXTENDED_RELEASE_TABLET | ORAL | Status: DC
  Administered 2018-01-23 – 2018-01-25 (×9): 2 via ORAL
  Filled 2018-01-22 (×12): qty 2

## 2018-01-22 MED ORDER — CARBIDOPA-LEVODOPA ER 25-100 MG PO TBCR
1.0000 | EXTENDED_RELEASE_TABLET | ORAL | Status: DC
  Administered 2018-01-22 – 2018-01-25 (×5): 1 via ORAL
  Filled 2018-01-22 (×6): qty 1

## 2018-01-22 MED ORDER — SODIUM CHLORIDE 0.9 % IV BOLUS
1000.0000 mL | Freq: Once | INTRAVENOUS | Status: AC
  Administered 2018-01-22: 1000 mL via INTRAVENOUS

## 2018-01-22 MED ORDER — SODIUM CHLORIDE 0.9 % IV SOLN
INTRAVENOUS | Status: DC
  Administered 2018-01-22 – 2018-01-25 (×5): via INTRAVENOUS

## 2018-01-22 MED ORDER — ALPRAZOLAM 0.25 MG PO TABS
0.2500 mg | ORAL_TABLET | Freq: Once | ORAL | Status: AC
  Administered 2018-01-22: 0.25 mg via ORAL
  Filled 2018-01-22: qty 1

## 2018-01-22 MED ORDER — ENOXAPARIN SODIUM 40 MG/0.4ML ~~LOC~~ SOLN
40.0000 mg | SUBCUTANEOUS | Status: DC
  Filled 2018-01-22: qty 0.4

## 2018-01-22 MED ORDER — ESCITALOPRAM OXALATE 20 MG PO TABS
20.0000 mg | ORAL_TABLET | Freq: Every day | ORAL | Status: DC
  Administered 2018-01-23 – 2018-01-25 (×3): 20 mg via ORAL
  Filled 2018-01-22 (×5): qty 1

## 2018-01-22 NOTE — ED Notes (Signed)
PT aware of urine sample. Unable t provide at this time will info staff when able
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
## 2018-01-22 NOTE — ED Triage Notes (Signed)
Patient has history of Parkinsons disease. Patient was sent over from Dr Garner GavelPertyl (sp?) office due to BP 60/40. Patient alert and ambulatory in  Triage. Patient denies pain, except for when he stands. Pt reports feeling "weak and dizzy" when he first stands, and feels abdominal pain. Patient wife at patient bedside.

## 2018-01-22 NOTE — Progress Notes (Signed)
   Subjective:    Patient ID: Stephen Gentry, male    DOB: 12/21/1934, 83 y.o.   MRN: [id number]  HPI Stephen Gentry is an 83 year old male with a history of slow transit constipation, gas and bloating, Parkinson's disease who is here for follow-up.  He was last seen on 10/08/2017 and is here today with his wife.  Bowel movements have overall been improved on current regimen which is Linzess 290 mcg daily and MiraLAX 17 g daily.  He has not needed to use an enema for a bowel movement in nearly a month and has not needed enemas often at all since being seen here last.  He did stop Linzess for about a week because it was not working as well and he developed worsening constipation.  When he resumed it it seemed to work better.  He is having a bowel movement most days which feel complete.  No blood in his stool or melena.  Still has issues with abdominal gas and bloating from time to time.  This is worse towards the end of the day.  Using align once daily as a probiotic.  Biggest complaint today is weakness and dizziness with standing.  This is been slowly progressive.  No chest pain, shortness of breath.  No fever or chills.   Review of Systems As per HPI, otherwise negative  Current Medications, Allergies, Past Medical History, Past Surgical History, Family History and Social History were reviewed in [facility] electronic medical record.     Objective:   Physical Exam BP (!) 60/38   Pulse 77   Ht 6\' 1"  (1.854 m)   Wt 170 lb (77.1 kg)   BMI 22.43 kg/m  Constitutional: Well-developed and well-nourished. No distress. HEENT: Normocephalic and atraumatic.  Conjunctivae are normal.  No scleral icterus. Neck: Neck supple. Trachea midline. Cardiovascular: Normal rate, regular rhythm and intact distal pulses.  Pulmonary/chest: Effort normal and breath sounds normal. No wheezing, rales or rhonchi. Abdominal: Soft, nontender, nondistended. Bowel sounds active throughout.  Extremities: no clubbing,  cyanosis, or edema Neurological: Alert and oriented to person place and time. Skin: Skin is warm and dry. Psychiatric: Normal mood and affect. Behavior is normal.      Assessment & Plan:  83 year old male with a history of slow transit constipation, gas and bloating, Parkinson's disease who is here for follow-up.  1.  Hypotension --he has remained consistently hypotensive today in clinic, we checked his blood pressure 3 times manually and systolics remain in the 60s.  He is nontoxic-appearing but I recommended that he be transported to the emergency department for further evaluation.  We did call his primary care office who agreed with this recommendation.  I spoke to Dr. Arbutus Leasat by phone also to let her know of his hypertension.  This will be fully evaluated but may also relate to Parkinson's.  Sooner follow-up with Dr. Arbutus Leasat may be needed if this is related to Parkinson's.  2.  Slow transit constipation with abdominal gas --doing better overall.  Continue Linzess 290 mcg daily.  The short drug holiday may have improve the efficacy of Linzess when it was resumed.  He can also continue MiraLAX 17 g once to twice daily along with daily probiotic.  He can continue using as needed Fleet enema to stimulate bowel movement if necessary.  25 minutes spent with the patient today. Greater than 50% was spent in counseling and coordination of care with the patient

## 2018-01-22 NOTE — ED Notes (Signed)
ED TO INPATIENT HANDOFF REPORT  Name/Age/Gender Stephen Gentry 83 y.o. male  Code Status   Home/SNF/Other Home  Chief Complaint sent from dr pertle, bp is off  Level of Care/Admitting Diagnosis ED Disposition    ED Disposition Condition [facility]: [facility] [[id number]]  Level of Care: Med-Surg [16]  Diagnosis: Orthostatic hypotension [458.0.ICD-9-CM]  Admitting Physician: Hosie Poisson [[id number]]  Attending Physician: Hosie Poisson [[id number]]  PT Class (Do Not Modify): Observation [104]  PT Acc Code (Do Not Modify): Observation [10022]       Medical History Past Medical History:  Diagnosis Date  . Baker's cyst    right knee  . Chronic constipation   . Diverticulosis   . Internal hemorrhoids   . Parkinson's disease ([facility])   . Vision abnormalities     Allergies Allergies  Allergen Reactions  . Tetracyclines & Related Anaphylaxis  . Amitiza [Lubiprostone] Nausea Only    IV Location/Drains/Wounds Patient Lines/Drains/Airways Status   Active Line/Drains/Airways    Name:   Placement date:   Placement time:   Site:   Days:   Peripheral IV 01/22/18 Left Forearm   01/22/18    1002    Forearm   less than 1          Labs/Imaging Results for orders placed or performed during the hospital encounter of 01/22/18 (from the past 48 hour(s))  Urinalysis, Routine w reflex microscopic     Status: Abnormal   Collection Time: 01/22/18  9:45 AM  Result Value Ref Range   Color, Urine YELLOW YELLOW   APPearance CLEAR CLEAR   Specific Gravity, Urine 1.012 1.005 - 1.030   pH 6.0 5.0 - 8.0   Glucose, UA NEGATIVE NEGATIVE mg/dL   Hgb urine dipstick NEGATIVE NEGATIVE   Bilirubin Urine NEGATIVE NEGATIVE   Ketones, ur 5 (A) NEGATIVE mg/dL   Protein, ur NEGATIVE NEGATIVE mg/dL   Nitrite NEGATIVE NEGATIVE   Leukocytes, UA NEGATIVE NEGATIVE    Comment: Performed at [facility] [address] [place], [place] [address]  CBC  with Differential/Platelet     Status: Abnormal   Collection Time: 01/22/18 10:01 AM  Result Value Ref Range   WBC 3.2 (L) 4.0 - 10.5 K/uL   RBC 4.51 4.22 - 5.81 MIL/uL   Hemoglobin 13.8 13.0 - 17.0 g/dL   HCT 42.5 39.0 - 52.0 %   MCV 94.2 80.0 - 100.0 fL   MCH 30.6 26.0 - 34.0 pg   MCHC 32.5 30.0 - 36.0 g/dL   RDW 13.1 11.5 - 15.5 %   Platelets 122 (L) 150 - 400 K/uL   nRBC 0.0 0.0 - 0.2 %   Neutrophils Relative % 59 %   Neutro Abs 1.9 1.7 - 7.7 K/uL   Lymphocytes Relative 27 %   Lymphs Abs 0.9 0.7 - 4.0 K/uL   Monocytes Relative 12 %   Monocytes Absolute 0.4 0.1 - 1.0 K/uL   Eosinophils Relative 1 %   Eosinophils Absolute 0.0 0.0 - 0.5 K/uL   Basophils Relative 1 %   Basophils Absolute 0.0 0.0 - 0.1 K/uL   Immature Granulocytes 0 %   Abs Immature Granulocytes 0.00 0.00 - 0.07 K/uL    Comment: Performed at [facility], [place] [address] [place], [place] [address]  Comprehensive metabolic panel     Status: Abnormal   Collection Time: 01/22/18 10:01 AM  Result Value Ref Range   Sodium 140 135 -  145 mmol/L   Potassium 3.9 3.5 - 5.1 mmol/L   Chloride 104 98 - 111 mmol/L   CO2 29 22 - 32 mmol/L   Glucose, Bld 85 70 - 99 mg/dL   BUN 23 8 - 23 mg/dL   Creatinine, Ser 0.81 0.61 - 1.24 mg/dL   Calcium 9.1 8.9 - 10.3 mg/dL   Total Protein 6.0 (L) 6.5 - 8.1 g/dL   Albumin 3.8 3.5 - 5.0 g/dL   AST 16 15 - 41 U/L   ALT 9 0 - 44 U/L   Alkaline Phosphatase 59 38 - 126 U/L   Total Bilirubin 0.9 0.3 - 1.2 mg/dL   GFR calc non Af Amer >60 >60 mL/min   GFR calc Af Amer >60 >60 mL/min    Comment: (NOTE) The eGFR has been calculated using the CKD EPI equation. This calculation has not been validated in all clinical situations. eGFR's persistently <60 mL/min signify possible Chronic Kidney Disease.    Anion gap 7 5 - 15    Comment: Performed at [facility], [place] [address] [place], [place] [address]  Cortisol     Status: None   Collection Time:  01/22/18 10:05 AM  Result Value Ref Range   Cortisol, Plasma 11.7 ug/dL    Comment: (NOTE) AM    6.7 - 22.6 ug/dL PM   <10.0       ug/dL Performed at [facility] [address] [place],  [address]   I-Stat CG4 Lactic Acid, ED     Status: None   Collection Time: 01/22/18 12:24 PM  Result Value Ref Range   Lactic Acid, Venous 0.60 0.5 - 1.9 mmol/L   Dg Chest 2 View  Result Date: 01/22/2018 CLINICAL DATA:  Weakness and hypotension EXAM: CHEST - 2 VIEW COMPARISON:  None. FINDINGS: Cardiac shadows within normal limits. The lungs are well aerated bilaterally. No focal infiltrate or sizable effusion is seen. Degenerative changes of the thoracic spine are noted. IMPRESSION: No active cardiopulmonary disease. Electronically Signed   By: Inez Catalina M.D.   On: 01/22/2018 12:07   EKG Interpretation  Date/Time:  Friday January 22 2018 09:38:44 EST Ventricular Rate:  61 PR Interval:    QRS Duration: 97 QT Interval:  408 QTC Calculation: 411 R Axis:   66 Text Interpretation:  Sinus rhythm Atrial premature complex No significant change was found Confirmed by Jola Schmidt [phone]) on 01/22/2018 11:00:37 AM   Pending Labs Unresulted Labs (From admission, onward)    Start     Ordered   01/22/18 0945  Blood culture (routine x 2)  BLOOD CULTURE X 2,   STAT     01/22/18 0945   01/22/18 0945  Urine culture  ONCE - STAT,   STAT     01/22/18 0945          Vitals/Pain Today's Vitals   01/22/18 1230 01/22/18 1245 01/22/18 1300 01/22/18 1315  BP: (!) 142/76 (!) 143/78 (!) 157/72 (!) 141/86  Pulse: 64 65 86   Resp: '14 12 15 13  ' Temp:      TempSrc:      SpO2: 100% 100% 96%     Isolation Precautions No active isolations  Medications Medications  sodium chloride 0.9 % bolus 1,000 mL (1,000 mLs Intravenous Bolus 01/22/18 1008)  sodium chloride 0.9 % bolus 1,000 mL (1,000 mLs Intravenous Bolus 01/22/18 1215)    Mobility walks with device

## 2018-01-22 NOTE — Patient Instructions (Signed)
Please go to immediately to [facility] for further evaluation of your very low blood pressure today.  If you are age 81 or older, your body mass index should be between 23-30. Your Body mass index is 22.43 kg/m. If this is out of the aforementioned range listed, please consider follow up with your Primary Care Provider.  If you are age 81 or younger, your body mass index should be between 19-25. Your Body mass index is 22.43 kg/m. If this is out of the aformentioned range listed, please consider follow up with your Primary Care Provider.

## 2018-01-23 ENCOUNTER — Observation Stay (HOSPITAL_BASED_OUTPATIENT_CLINIC_OR_DEPARTMENT_OTHER): Payer: Medicare Other

## 2018-01-23 DIAGNOSIS — I951 Orthostatic hypotension: Principal | ICD-10-CM

## 2018-01-23 DIAGNOSIS — K5909 Other constipation: Secondary | ICD-10-CM | POA: Diagnosis present

## 2018-01-23 DIAGNOSIS — K5900 Constipation, unspecified: Secondary | ICD-10-CM | POA: Diagnosis not present

## 2018-01-23 DIAGNOSIS — I340 Nonrheumatic mitral (valve) insufficiency: Secondary | ICD-10-CM | POA: Diagnosis not present

## 2018-01-23 DIAGNOSIS — I4892 Unspecified atrial flutter: Secondary | ICD-10-CM | POA: Diagnosis present

## 2018-01-23 DIAGNOSIS — G909 Disorder of the autonomic nervous system, unspecified: Secondary | ICD-10-CM | POA: Diagnosis present

## 2018-01-23 DIAGNOSIS — F419 Anxiety disorder, unspecified: Secondary | ICD-10-CM | POA: Diagnosis present

## 2018-01-23 DIAGNOSIS — Z881 Allergy status to other antibiotic agents status: Secondary | ICD-10-CM | POA: Diagnosis not present

## 2018-01-23 DIAGNOSIS — F05 Delirium due to known physiological condition: Secondary | ICD-10-CM | POA: Diagnosis not present

## 2018-01-23 DIAGNOSIS — Z79899 Other long term (current) drug therapy: Secondary | ICD-10-CM | POA: Diagnosis not present

## 2018-01-23 DIAGNOSIS — Z888 Allergy status to other drugs, medicaments and biological substances status: Secondary | ICD-10-CM | POA: Diagnosis not present

## 2018-01-23 DIAGNOSIS — G20 Parkinson's disease: Secondary | ICD-10-CM | POA: Diagnosis present

## 2018-01-23 DIAGNOSIS — Z87891 Personal history of nicotine dependence: Secondary | ICD-10-CM | POA: Diagnosis not present

## 2018-01-23 DIAGNOSIS — I491 Atrial premature depolarization: Secondary | ICD-10-CM | POA: Diagnosis present

## 2018-01-23 DIAGNOSIS — Z8719 Personal history of other diseases of the digestive system: Secondary | ICD-10-CM | POA: Diagnosis not present

## 2018-01-23 LAB — BASIC METABOLIC PANEL
Anion gap: 6 (ref 5–15)
BUN: 16 mg/dL (ref 8–23)
CHLORIDE: 109 mmol/L (ref 98–111)
CO2: 25 mmol/L (ref 22–32)
CREATININE: 0.68 mg/dL (ref 0.61–1.24)
Calcium: 9 mg/dL (ref 8.9–10.3)
GFR calc Af Amer: >60 mL/min (ref >60)
GFR calc non Af Amer: >60 mL/min (ref >60)
GLUCOSE: 92 mg/dL (ref 70–99)
Potassium: 3.9 mmol/L (ref 3.5–5.1)
Sodium: 140 mmol/L (ref 135–145)

## 2018-01-23 LAB — CBC
HCT: 42 % (ref 39.0–52.0)
HEMOGLOBIN: 13.6 g/dL (ref 13.0–17.0)
MCH: 30.4 pg (ref 26.0–34.0)
MCHC: 32.4 g/dL (ref 30.0–36.0)
MCV: 93.8 fL (ref 80.0–100.0)
PLATELETS: 121 10*3/uL — AB (ref 150–400)
RBC: 4.48 MIL/uL (ref 4.22–5.81)
RDW: 13 % (ref 11.5–15.5)
WBC: 4 10*3/uL (ref 4.0–10.5)
nRBC: 0 % (ref 0.0–0.2)

## 2018-01-23 LAB — TSH: TSH: 3.824 u[IU]/mL (ref 0.350–4.500)

## 2018-01-23 LAB — ECHOCARDIOGRAM COMPLETE
Height: 73 in
WEIGHTICAEL: 2720 [oz_av]

## 2018-01-23 LAB — URINE CULTURE: CULTURE: NO GROWTH

## 2018-01-23 LAB — TROPONIN I
Troponin I: <0.03 ng/mL (ref <0.03)
Troponin I: <0.03 ng/mL (ref <0.03)

## 2018-01-23 MED ORDER — APIXABAN 5 MG PO TABS
5.0000 mg | ORAL_TABLET | Freq: Two times a day (BID) | ORAL | Status: DC
  Administered 2018-01-23 – 2018-01-25 (×4): 5 mg via ORAL
  Filled 2018-01-23 (×5): qty 1

## 2018-01-23 MED ORDER — BISACODYL 5 MG PO TBEC
5.0000 mg | DELAYED_RELEASE_TABLET | Freq: Every day | ORAL | Status: DC | PRN
  Administered 2018-01-23: 5 mg via ORAL
  Filled 2018-01-23: qty 1

## 2018-01-23 MED ORDER — POLYETHYLENE GLYCOL 3350 17 G PO PACK
17.0000 g | PACK | Freq: Every day | ORAL | Status: DC
  Administered 2018-01-23 – 2018-01-25 (×3): 17 g via ORAL
  Filled 2018-01-23 (×3): qty 1

## 2018-01-23 MED ORDER — LORAZEPAM 0.5 MG PO TABS
0.5000 mg | ORAL_TABLET | Freq: Three times a day (TID) | ORAL | Status: DC | PRN
  Administered 2018-01-23: 0.5 mg via ORAL
  Filled 2018-01-23: qty 1

## 2018-01-23 MED ORDER — HALOPERIDOL LACTATE 5 MG/ML IJ SOLN
2.0000 mg | Freq: Once | INTRAMUSCULAR | Status: AC
  Administered 2018-01-23: 2 mg via INTRAVENOUS
  Filled 2018-01-23: qty 1

## 2018-01-23 MED ORDER — PANTOPRAZOLE SODIUM 40 MG PO TBEC
40.0000 mg | DELAYED_RELEASE_TABLET | Freq: Every day | ORAL | Status: DC
  Administered 2018-01-24 – 2018-01-25 (×2): 40 mg via ORAL
  Filled 2018-01-23 (×2): qty 1

## 2018-01-23 MED ORDER — SIMETHICONE 80 MG PO CHEW
160.0000 mg | CHEWABLE_TABLET | Freq: Four times a day (QID) | ORAL | Status: DC
  Administered 2018-01-23 – 2018-01-25 (×9): 160 mg via ORAL
  Filled 2018-01-23 (×10): qty 2

## 2018-01-23 NOTE — Progress Notes (Signed)
Report called patient to be transferred to 1445.

## 2018-01-23 NOTE — Progress Notes (Signed)
PROGRESS NOTE    Stephen Gentry  ZOX:[id number] DOB: 05-01-1934 DOA: 01/22/2018 PCP: [name], MD    Brief Narrative:  Patient is a pleasant 83 year old gentleman history of Parkinson's disease diagnosed 5 years ago on Sinemet, chronic diverticulosis, constipation seen at his gastroenterologist office, Dr. Lauro Franklin and found to have systolic blood pressures of 60 with diastolic of 40s and sent to [facility] long ED for further evaluation.  In the ED patient noted to have blood pressure of 60/40 and symptomatic with lightheadedness and dizziness.  Patient admitted for further evaluation.  EKG done overnight noted patient to be in atrial flutter.  Patient hydrated with IV fluids with some improvement with orthostasis.   Assessment & Plan:   Principal Problem:   Orthostatic hypotension Active Problems:   Atrial flutter (HCC)   Parkinson's disease (HCC)   Anxiety   Constipation  #1 orthostatic hypotension Questionable etiology.  May be secondary to autonomic dysfunction secondary to advanced Parkinson's disease.  Patient with no signs of infection.  Patient with no overt bleeding.  Hemoglobin stable at 13.6.  TSH within normal limits at 3.824.  Random cortisol 11.7.  Lactic acid is 0.6.  Urinalysis was nitrite negative, leukocytes negative.  Repeat orthostasis with some improvement however patient still symptomatic with some dizziness and lightheadedness on ambulation.  Continue IV fluids.  Repeat orthostatics in the morning.  Follow.  2.  Atrial flutter Noted on EKG done on admission.  Patient denies any cardiac history.  Transfer to telemetry.  Cardiac enzymes have been ordered and being cycled initial cardiac enzyme is negative.  TSH within normal limits.  2D echo was ordered today and has been done with a EF of 55 to 60%.  No wall motion abnormalities.  Trivial aortic valvular regurgitation and mild mitral valvular regurgitation.  No PFO.  Case discussed with cardiology and patient with a  chads 2 Vascor of greater than 2 and no known history of falls or bleeding.  Will place on Eliquis 5 mg twice daily.  Patient will follow-up with cardiology in the A. fib clinic for further evaluation and management in the outpatient setting.  3.  Anxiety Placed on Ativan as needed.  4.  Chronic constipation Resume home regimen of MiraLAX.  5.  Parkinson's disease Continue home regimen of Sinemet.   DVT prophylaxis: Lovenox Code Status: Full Family Communication: Updated patient and son at bedside. Disposition Plan: Transfer to telemetry.  Disposition pending work-up.   Consultants:   Cardiology pending  Procedures:  2D echo pending 01/23/2018  Chest x-ray 01/22/2018  Antimicrobials:   None   Subjective: Laying in bed.  States some improvement with dizziness and lightheadedness since admission however still with some dizziness and lightheadedness with orthostatics and on ambulation.  Denies any chest pain.  No shortness of breath.  Patient noted to have a flutter on EKG which was done last night.  Objective: Vitals:   01/23/18 0623 01/23/18 0932 01/23/18 1300 01/23/18 1446  BP: (!) 135/92   138/90  Pulse: 70   79  Resp: 16   20  Temp: 97.8 F (36.6 C)   97.7 F (36.5 C)  TempSrc: Oral   Oral  SpO2: 100% 100%  100%  Weight:   76.1 kg   Height:   6\' 1"  (1.854 m)     Intake/Output Summary (Last 24 hours) at 01/23/2018 1551 Last data filed at 01/23/2018 1239 Gross per 24 hour  Intake 798.45 ml  Output 3200 ml  Net -2401.55  ml   Filed Weights   01/23/18 1300  Weight: 76.1 kg    Examination:  General exam: nad.  Pill-rolling tremors. Respiratory system: Clear to auscultation. Respiratory effort normal. Cardiovascular system: S1 & S2 heard, RRR. No JVD, murmurs, rubs, gallops or clicks. No pedal edema. Gastrointestinal system: Abdomen is nondistended, soft and nontender. No organomegaly or masses felt. Normal bowel sounds heard. Central nervous system:  Alert and oriented. No focal neurological deficits. Extremities: Symmetric 5 x 5 power. Skin: No rashes, lesions or ulcers Psychiatry: Judgement and insight appear normal. Mood & affect appropriate.     Data Reviewed: I have personally reviewed following labs and imaging studies  CBC: Recent Labs  Lab 01/22/18 1001 01/23/18 0452  WBC 3.2* 4.0  NEUTROABS 1.9  --   HGB 13.8 13.6  HCT 42.5 42.0  MCV 94.2 93.8  PLT 122* 121*   Basic Metabolic Panel: Recent Labs  Lab 01/22/18 1001 01/23/18 0452  NA 140 140  K 3.9 3.9  CL 104 109  CO2 29 25  GLUCOSE 85 92  BUN 23 16  CREATININE 0.81 0.68  CALCIUM 9.1 9.0   GFR: Estimated Creatinine Clearance: 77.9 mL/min (by C-G formula based on SCr of 0.68 mg/dL). Liver Function Tests: Recent Labs  Lab 01/22/18 1001  AST 16  ALT 9  ALKPHOS 59  BILITOT 0.9  PROT 6.0*  ALBUMIN 3.8   No results for input(s): LIPASE, AMYLASE in the last 168 hours. No results for input(s): AMMONIA in the last 168 hours. Coagulation Profile: No results for input(s): INR, PROTIME in the last 168 hours. Cardiac Enzymes: Recent Labs  Lab 01/23/18 0934  TROPONINI <0.03   BNP (last 3 results) No results for input(s): PROBNP in the last 8760 hours. HbA1C: No results for input(s): HGBA1C in the last 72 hours. CBG: No results for input(s): GLUCAP in the last 168 hours. Lipid Profile: No results for input(s): CHOL, HDL, LDLCALC, TRIG, CHOLHDL, LDLDIRECT in the last 72 hours. Thyroid Function Tests: Recent Labs    01/23/18 0456  TSH 3.824   Anemia Panel: No results for input(s): VITAMINB12, FOLATE, FERRITIN, TIBC, IRON, RETICCTPCT in the last 72 hours. Sepsis Labs: Recent Labs  Lab 01/22/18 1224  LATICACIDVEN 0.60    Recent Results (from the past 240 hour(s))  Urine culture     Status: None   Collection Time: 01/22/18  9:45 AM  Result Value Ref Range Status   Specimen Description   Final    URINE, CLEAN CATCH Performed at [facility], [address] [place], [place] [address]    Special Requests   Final    NONE Performed at [facility], [address], [place], [place] [address]    Culture   Final    NO GROWTH Performed at [facility], [address], [place], [place] [address]    Report Status 01/23/2018 FINAL  Final  Blood culture (routine x 2)     Status: None (Preliminary result)   Collection Time: 01/22/18 10:01 AM  Result Value Ref Range Status   Specimen Description   Final    RIGHT ANTECUBITAL Performed at [facility], [address], [place], [place] [address]    Special Requests   Final    BOTTLES DRAWN AEROBIC AND ANAEROBIC Blood Culture adequate volume Performed at [facility], [address], [place], [place] [address]    Culture   Final    NO GROWTH  1 DAY Performed at [facility], [address] [place], [place] [address]    Report Status PENDING  Incomplete  Blood culture (routine x 2)     Status: None (Preliminary result)   Collection Time: 01/22/18 10:02 AM  Result Value Ref Range Status   Specimen Description   Final    LEFT ANTECUBITAL Performed at [facility], [address] [place], [place] [address]    Special Requests   Final    BOTTLES DRAWN AEROBIC AND ANAEROBIC Blood Culture adequate volume Performed at [facility], [address] [place], [place] [address]    Culture   Final    NO GROWTH 1 DAY Performed at [facility], [address] [place], [place] [address]    Report Status PENDING  Incomplete         Radiology Studies: Dg Chest 2 View  Result Date: 01/22/2018 CLINICAL DATA:  Weakness and hypotension EXAM: CHEST - 2 VIEW COMPARISON:  None. FINDINGS: Cardiac shadows within normal limits. The lungs are well aerated bilaterally. No focal infiltrate or sizable effusion is seen. Degenerative changes of the thoracic spine are  noted. IMPRESSION: No active cardiopulmonary disease. Electronically Signed   By: Alcide CleverMark  Lukens M.D.   On: 01/22/2018 12:07        Scheduled Meds: . Carbidopa-Levodopa ER  1 tablet Oral 2 times per day  . Carbidopa-Levodopa ER  2 tablet Oral 3 times per day  . enoxaparin (LOVENOX) injection  40 mg Subcutaneous Q24H  . escitalopram  20 mg Oral Daily  . [START ON 01/24/2018] pantoprazole  40 mg Oral Q0600  . polyethylene glycol  17 g Oral Daily  . simethicone  160 mg Oral QID   Continuous Infusions: . sodium chloride 75 mL/hr at 01/22/18 1641     LOS: 0 days    Time spent: 40 minutes    Ramiro Harvestaniel Thompson, MD [facility] Pager [phone]  If 7PM-7AM, please contact night-coverage [email or web address] Password [email or web address] 01/23/2018, 3:51 PM

## 2018-01-23 NOTE — Progress Notes (Signed)
Patient transferred into room 1445. Agree with shift assessment from previous RN. No changes noted at this time. Will continue to monitor.

## 2018-01-23 NOTE — Evaluation (Signed)
Physical Therapy Evaluation Patient Details Name: Stephen Gentry MRN: [id number] DOB: 08/17/1934 Today's Date: 01/23/2018   History of Present Illness  Pt admitted with orthostatic hypotension and with hx of Parkinsons  Clinical Impression  Pt admitted as above and presenting with functional mobility limitations 2* generalized weakness and mild ambulatory balance deficits.  This session orthostatic BP as follows:  Supine 137/88, sit 136/69, stand 122/86, standing 3 minutes 112/87, after ambulation 103/55.  Pt denies dizziness throughout session.  RN provided with readings.     Follow Up Recommendations No PT follow up    Equipment Recommendations  None recommended by PT    Recommendations for Other Services       Precautions / Restrictions Precautions Precautions: Fall Restrictions Weight Bearing Restrictions: No      Mobility  Bed Mobility Overal bed mobility: Modified Independent             General bed mobility comments: Pt to EOB unassisted  Transfers Overall transfer level: Needs assistance Equipment used: None Transfers: Sit to/from Stand Sit to Stand: Min guard         General transfer comment: steady assist only  Ambulation/Gait Ambulation/Gait assistance: Min assist Gait Distance (Feet): 300 Feet Assistive device: None Gait Pattern/deviations: Step-through pattern;Decreased step length - right;Decreased step length - left;Shuffle;Trunk flexed Gait velocity: mod pace   General Gait Details: mild instability but no LOB.  Pt states "I usually walk much faster"  Stairs            Wheelchair Mobility    Modified Rankin (Stroke Patients Only)       Balance Overall balance assessment: Mild deficits observed, not formally tested                                           Pertinent Vitals/Pain Pain Assessment: No/denies pain    Home Living Family/patient expects to be discharged to:: Private residence Living Arrangements:  Spouse/significant other Available Help at Discharge: Family Type of Home: House Home Access: Stairs to enter Entrance Stairs-Rails: None Entrance Stairs-Number of Steps: 2 Home Layout: Two level Home Equipment: Environmental consultantWalker - 2 wheels;Cane - single point      Prior Function Level of Independence: Independent               Hand Dominance        Extremity/Trunk Assessment   Upper Extremity Assessment Upper Extremity Assessment: Generalized weakness    Lower Extremity Assessment Lower Extremity Assessment: Generalized weakness       Communication   Communication: No difficulties  Cognition Arousal/Alertness: Awake/alert Behavior During Therapy: WFL for tasks assessed/performed Overall Cognitive Status: Within Functional Limits for tasks assessed                                        General Comments      Exercises     Assessment/Plan    PT Assessment Patient needs continued PT services  PT Problem List Decreased strength;Decreased activity tolerance;Decreased balance       PT Treatment Interventions DME instruction;Gait training;Stair training;Functional mobility training;Therapeutic activities;Therapeutic exercise;Patient/family education    PT Goals (Current goals can be found in the Care Plan section)  Acute Rehab PT Goals Patient Stated Goal: Regain IND PT Goal Formulation: With patient Time For Goal Achievement:  02/06/18 Potential to Achieve Goals: Good    Frequency Min 3X/week   Barriers to discharge        Co-evaluation               AM-PAC PT "6 Clicks" Daily Activity  Outcome Measure Difficulty turning over in bed (including adjusting bedclothes, sheets and blankets)?: A Little Difficulty moving from lying on back to sitting on the side of the bed? : A Little Difficulty sitting down on and standing up from a chair with arms (e.g., wheelchair, bedside commode, etc,.)?: A Lot Help needed moving to and from a bed to  chair (including a wheelchair)?: A Little Help needed walking in hospital room?: A Little Help needed climbing 3-5 steps with a railing? : A Little 6 Click Score: 17    End of Session Equipment Utilized During Treatment: Gait belt Activity Tolerance: Patient tolerated treatment well Patient left: in chair;with call bell/phone within reach;with family/visitor present Nurse Communication: Mobility status PT Visit Diagnosis: Muscle weakness (generalized) (M62.81)    Time: 1478-2956 PT Time Calculation (min) (ACUTE ONLY): 31 min   Charges:   PT Evaluation $PT Eval Low Complexity: 1 Low PT Treatments $Gait Training: 8-22 mins        Stephen Gentry PT Acute Rehabilitation Services Pager [phone] Office [phone]   Stephen Gentry 01/23/2018, 3:39 PM

## 2018-01-23 NOTE — Progress Notes (Signed)
  Echocardiogram 2D Echocardiogram has been performed.  Kiyo Heal G Abbagale Goguen 01/23/2018, 11:07 AM

## 2018-01-23 NOTE — Progress Notes (Signed)
Attempted to call report for transfer to 1445 nurse is unable to take report and will call back.

## 2018-01-23 NOTE — Progress Notes (Signed)
ANTICOAGULATION CONSULT NOTE   Pharmacy Consult for Eliquis Indication: atrial fibrillation  Allergies  Allergen Reactions  . Tetracyclines & Related Anaphylaxis  . Amitiza [Lubiprostone] Nausea Only    Patient Measurements: Height: 6\' 1"  (185.4 cm) Weight: 167 lb 12.3 oz (76.1 kg) IBW/kg (Calculated) : 79.9 Heparin Dosing Weight:   Vital Signs: Temp: 97.7 F (36.5 C) (11/16 1446) Temp Source: Oral (11/16 1446) BP: 138/90 (11/16 1446) Pulse Rate: 79 (11/16 1446)  Labs: Recent Labs    01/22/18 1001 01/23/18 0452 01/23/18 0934  HGB 13.8 13.6  --   HCT 42.5 42.0  --   PLT 122* 121*  --   CREATININE 0.81 0.68  --   TROPONINI  --   --  <0.03    Estimated Creatinine Clearance: 77.9 mL/min (by C-G formula based on SCr of 0.68 mg/dL).   Assessment: Patient's an 83 y.o M presented to the ED from Dr. Lauro FranklinPyrtle's office on 01/22/18 for workup of hypotension.  To start Eliquis on 11/16 for afib.    Plan:  - d/c lovenox 40 mg SQ daily - Eliquis 5 mg bid (for weight >60 kg and scr <1.5) - monitor for s/s bleeding  Glendon Dunwoody P 01/23/2018,4:26 PM

## 2018-01-23 NOTE — Discharge Instructions (Signed)

## 2018-01-24 LAB — BASIC METABOLIC PANEL
ANION GAP: 8 (ref 5–15)
BUN: 15 mg/dL (ref 8–23)
CALCIUM: 8.8 mg/dL — AB (ref 8.9–10.3)
CO2: 25 mmol/L (ref 22–32)
Chloride: 106 mmol/L (ref 98–111)
Creatinine, Ser: 0.71 mg/dL (ref 0.61–1.24)
Glucose, Bld: 122 mg/dL — ABNORMAL HIGH (ref 70–99)
POTASSIUM: 3.4 mmol/L — AB (ref 3.5–5.1)
SODIUM: 139 mmol/L (ref 135–145)

## 2018-01-24 MED ORDER — MIDODRINE HCL 2.5 MG PO TABS
2.5000 mg | ORAL_TABLET | Freq: Three times a day (TID) | ORAL | Status: DC
  Administered 2018-01-24 – 2018-01-25 (×3): 2.5 mg via ORAL
  Filled 2018-01-24 (×3): qty 1

## 2018-01-24 MED ORDER — POTASSIUM CHLORIDE 20 MEQ PO PACK
40.0000 meq | PACK | Freq: Once | ORAL | Status: AC
  Administered 2018-01-24: 40 meq via ORAL
  Filled 2018-01-24: qty 2

## 2018-01-24 MED ORDER — RISPERIDONE 0.5 MG PO TBDP
0.5000 mg | ORAL_TABLET | Freq: Every day | ORAL | Status: DC
  Administered 2018-01-24: 0.5 mg via ORAL
  Filled 2018-01-24: qty 1

## 2018-01-24 MED ORDER — LINACLOTIDE 145 MCG PO CAPS
290.0000 ug | ORAL_CAPSULE | Freq: Every day | ORAL | Status: DC
  Administered 2018-01-25: 290 ug via ORAL
  Filled 2018-01-24 (×2): qty 2

## 2018-01-24 MED ORDER — LINACLOTIDE 145 MCG PO CAPS: 290.0000 ug | ORAL_CAPSULE | Freq: Every day | ORAL | Status: DC

## 2018-01-24 NOTE — Progress Notes (Signed)
Pt has been confused trying to leave all shift, constantly jumping out of bed. Wife was called she could not come back due to exhaustion. Sitter came for pt but pt came out into the hall and tried to Careers information officerhit staff. Haldol was given and pt put in wrists. Pt was then still aggressive trying to bite, kick, and throw legs out of the bed. So then ankle restraints were applied. Wife is aware and agreed pt needed to be restrained.  Lenox PondsJerry L Birdell Frasier, RN

## 2018-01-24 NOTE — Plan of Care (Signed)
Restraints removed at beginning of shift, sitter in room. Patient with positive orthostatic vitals this a.m., did express dizziness with standing.  Patient ambulatory to bathroom with one hands on assist several times this shift, sat up in chair.  Wife and son at bedside all shift.  Patient does complain of feeling weak and does not understand why "all of this is happening".

## 2018-01-24 NOTE — Progress Notes (Signed)
PROGRESS NOTE    Stephen Gentry  ZOX:[id number] DOB: February 18, 1935 DOA: 01/22/2018 PCP: [name], MD    Brief Narrative:  Patient is a pleasant 83 year old gentleman history of Parkinson's disease diagnosed 5 years ago on Sinemet, chronic diverticulosis, constipation seen at his gastroenterologist office, Dr. Lauro Franklin and found to have systolic blood pressures of 60 with diastolic of 40s and sent to [facility] long ED for further evaluation.  In the ED patient noted to have blood pressure of 60/40 and symptomatic with lightheadedness and dizziness.  Patient admitted for further evaluation.  EKG done overnight noted patient to be in atrial flutter.  Patient hydrated with IV fluids with some improvement with orthostasis.   Assessment & Plan:   Principal Problem:   Orthostatic hypotension Active Problems:   Atrial flutter (HCC)   Parkinson's disease (HCC)   Anxiety   Constipation  #1 orthostatic hypotension Questionable etiology.  May be secondary to autonomic dysfunction secondary to advanced Parkinson's disease.  Patient with no signs of infection.  Patient with no overt bleeding.  Hemoglobin stable at 13.6.  TSH within normal limits at 3.824.  Random cortisol 11.7.  Lactic acid is 0.6.  Urinalysis was nitrite negative, leukocytes negative.  Repeat orthostasis with some improvement however patient still symptomatic with some dizziness and lightheadedness on ambulation.  Continue IV fluids.  Placed on midodrine 2.5 mg 3 times daily.  Repeat orthostatics in the morning.  Follow.  2.  Atrial flutter Noted on EKG done on admission.  Patient denies any cardiac history.  Transfer to telemetry.  Cardiac enzymes have been ordered and being cycled initial cardiac enzyme is negative.  TSH within normal limits.  2D echo was ordered today and has been done with a EF of 55 to 60%.  No wall motion abnormalities.  Trivial aortic valvular regurgitation and mild mitral valvular regurgitation.  No PFO.  Case  discussed with cardiology and patient with a chads 2 Vascor of greater than 2 and no known history of falls or bleeding.  Patient started on Eliquis 5 mg twice daily.  Patient will follow-up with cardiology in the A. fib clinic for further evaluation and management in the outpatient setting.  3.  Anxiety Ativan discontinued per wife's insistence as she feels Ativan that patient received early on in the day late to his combativeness and agitation overnight.  Monitor for now.    4.  Chronic constipation Resumed home regimen of MiraLAX.  Resume home regimen of Linzess.  5.  Parkinson's disease Patient noted to have some sundowning overnight however per wife feels he secondary to Ativan that was given to patient early on yesterday afternoon.  Continue home regimen of Sinemet.  Resporal nightly.   DVT prophylaxis: Lovenox Code Status: Full Family Communication: Updated patient and wife and son at bedside. Disposition Plan: Home once orthostasis is resolved.     Consultants:   Curbside cardiology: Dr. Mayford Knife  Procedures:  2D echo 01/23/2018  Chest x-ray 01/22/2018  Antimicrobials:   None   Subjective: Patient sitting in chair eating his lunch.  Patient denies any chest pain no shortness of breath.  Patient noted to still be orthostatic this morning and symptomatic with dizziness and lightheadedness.  RN.  Patient also noted to be agitated and combative overnight.  Patient's wife states Ativan leading to be combative although patient only received 1 dose of Ativan early on yesterday afternoon.  Wife does not think patient is sundowning.   Objective: Vitals:   01/23/18 0932 01/23/18  1300 01/23/18 1446 01/24/18 0442  BP:   138/90 (!) 156/88  Pulse:   79 94  Resp:   20 16  Temp:   97.7 F (36.5 C) 97.8 F (36.6 C)  TempSrc:   Oral Oral  SpO2: 100%  100% 97%  Weight:  76.1 kg    Height:  6\' 1"  (1.854 m)      Intake/Output Summary (Last 24 hours) at 01/24/2018 1240 Last data  filed at 01/24/2018 1143 Gross per 24 hour  Intake 340 ml  Output 2075 ml  Net -1735 ml   Filed Weights   01/23/18 1300  Weight: 76.1 kg    Examination:  General exam: nad.  Pill-rolling tremors. Respiratory system: Lungs clear to auscultation bilaterally.  No wheezes, no crackles, no rhonchi.  Respiratory effort normal. Cardiovascular system: Regular rate and rhythm no murmurs rubs or gallops.  No JVD.  No lower extremity edema.  Gastrointestinal system: Abdomen is soft, nontender, nondistended, positive bowel sounds.  No rebound.  No guarding.  Central nervous system: Alert and oriented. No focal neurological deficits. Extremities: Symmetric 5 x 5 power. Skin: No rashes, lesions or ulcers Psychiatry: Judgement and insight appear normal. Mood & affect appropriate.     Data Reviewed: I have personally reviewed following labs and imaging studies  CBC: Recent Labs  Lab 01/22/18 1001 01/23/18 0452  WBC 3.2* 4.0  NEUTROABS 1.9  --   HGB 13.8 13.6  HCT 42.5 42.0  MCV 94.2 93.8  PLT 122* 121*   Basic Metabolic Panel: Recent Labs  Lab 01/22/18 1001 01/23/18 0452 01/24/18 0922  NA 140 140 139  K 3.9 3.9 3.4*  CL 104 109 106  CO2 29 25 25   GLUCOSE 85 92 122*  BUN 23 16 15   CREATININE 0.81 0.68 0.71  CALCIUM 9.1 9.0 8.8*   GFR: Estimated Creatinine Clearance: 77.9 mL/min (by C-G formula based on SCr of 0.71 mg/dL). Liver Function Tests: Recent Labs  Lab 01/22/18 1001  AST 16  ALT 9  ALKPHOS 59  BILITOT 0.9  PROT 6.0*  ALBUMIN 3.8   No results for input(s): LIPASE, AMYLASE in the last 168 hours. No results for input(s): AMMONIA in the last 168 hours. Coagulation Profile: No results for input(s): INR, PROTIME in the last 168 hours. Cardiac Enzymes: Recent Labs  Lab 01/23/18 0934 01/23/18 1604 01/23/18 2130  TROPONINI <0.03 <0.03 <0.03   BNP (last 3 results) No results for input(s): PROBNP in the last 8760 hours. HbA1C: No results for input(s):  HGBA1C in the last 72 hours. CBG: No results for input(s): GLUCAP in the last 168 hours. Lipid Profile: No results for input(s): CHOL, HDL, LDLCALC, TRIG, CHOLHDL, LDLDIRECT in the last 72 hours. Thyroid Function Tests: Recent Labs    01/23/18 0456  TSH 3.824   Anemia Panel: No results for input(s): VITAMINB12, FOLATE, FERRITIN, TIBC, IRON, RETICCTPCT in the last 72 hours. Sepsis Labs: Recent Labs  Lab 01/22/18 1224  LATICACIDVEN 0.60    Recent Results (from the past 240 hour(s))  Urine culture     Status: None   Collection Time: 01/22/18  9:45 AM  Result Value Ref Range Status   Specimen Description   Final    URINE, CLEAN CATCH Performed at [facility], [address], [place], [place] [address]    Special Requests   Final    NONE Performed at [facility], [address] [place], [place] [address]    Culture  Final    NO GROWTH Performed at [facility], [address] [place], [place] [address]    Report Status 01/23/2018 FINAL  Final  Blood culture (routine x 2)     Status: None (Preliminary result)   Collection Time: 01/22/18 10:01 AM  Result Value Ref Range Status   Specimen Description   Final    RIGHT ANTECUBITAL Performed at [facility], [address] [place], [place] [address]    Special Requests   Final    BOTTLES DRAWN AEROBIC AND ANAEROBIC Blood Culture adequate volume Performed at [facility], [address] [place], [place] [address]    Culture   Final    NO GROWTH 1 DAY Performed at [facility], [address] [place], [place] [address]    Report Status PENDING  Incomplete  Blood culture (routine x 2)     Status: None (Preliminary result)   Collection Time: 01/22/18 10:02 AM  Result Value Ref Range Status   Specimen Description   Final    LEFT ANTECUBITAL Performed at [facility], [address] [place], [place] [address]      Special Requests   Final    BOTTLES DRAWN AEROBIC AND ANAEROBIC Blood Culture adequate volume Performed at [facility], [address] [place], [place] [address]    Culture   Final    NO GROWTH 1 DAY Performed at [facility], [address] [place], [place] [address]    Report Status PENDING  Incomplete         Radiology Studies: No results found.      Scheduled Meds: . apixaban  5 mg Oral BID  . Carbidopa-Levodopa ER  1 tablet Oral 2 times per day  . Carbidopa-Levodopa ER  2 tablet Oral 3 times per day  . escitalopram  20 mg Oral Daily  . linaclotide  290 mcg Oral QAC breakfast  . midodrine  2.5 mg Oral TID WC  . pantoprazole  40 mg Oral Q0600  . polyethylene glycol  17 g Oral Daily  . risperiDONE  0.5 mg Oral QHS  . simethicone  160 mg Oral QID   Continuous Infusions: . sodium chloride 75 mL/hr at 01/24/18 0538     LOS: 1 day    Time spent: 40 minutes    Ramiro Harvestaniel Thompson, MD [facility] Pager [phone]  If 7PM-7AM, please contact night-coverage [email or web address] Password [email or web address] 01/24/2018, 12:40 PM
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
## 2018-01-25 LAB — BASIC METABOLIC PANEL
Anion gap: 6 (ref 5–15)
BUN: 14 mg/dL (ref 8–23)
CALCIUM: 8.6 mg/dL — AB (ref 8.9–10.3)
CO2: 27 mmol/L (ref 22–32)
CREATININE: 0.76 mg/dL (ref 0.61–1.24)
Chloride: 108 mmol/L (ref 98–111)
GFR calc Af Amer: >60 mL/min (ref >60)
GLUCOSE: 101 mg/dL — AB (ref 70–99)
Potassium: 3.7 mmol/L (ref 3.5–5.1)
Sodium: 141 mmol/L (ref 135–145)

## 2018-01-25 MED ORDER — PANTOPRAZOLE SODIUM 40 MG PO TBEC: 40.0000 mg | DELAYED_RELEASE_TABLET | Freq: Every day | ORAL | 0 refills | Status: DC

## 2018-01-25 MED ORDER — POTASSIUM CHLORIDE 20 MEQ PO PACK
40.0000 meq | PACK | Freq: Once | ORAL | Status: AC
  Administered 2018-01-25: 40 meq via ORAL
  Filled 2018-01-25: qty 2

## 2018-01-25 MED ORDER — SIMETHICONE 80 MG PO CHEW: 160.0000 mg | CHEWABLE_TABLET | Freq: Four times a day (QID) | ORAL | 0 refills | Status: DC | PRN

## 2018-01-25 MED ORDER — APIXABAN 5 MG PO TABS: 5.0000 mg | ORAL_TABLET | Freq: Two times a day (BID) | ORAL | 0 refills | Status: DC

## 2018-01-25 MED ORDER — MIDODRINE HCL 5 MG PO TABS: 5.0000 mg | ORAL_TABLET | Freq: Three times a day (TID) | ORAL | 0 refills | Status: DC

## 2018-01-25 MED ORDER — MIDODRINE HCL 5 MG PO TABS
5.0000 mg | ORAL_TABLET | Freq: Three times a day (TID) | ORAL | Status: DC
  Administered 2018-01-25 (×2): 5 mg via ORAL
  Filled 2018-01-25 (×2): qty 1

## 2018-01-25 NOTE — Progress Notes (Signed)
PHARMACY NOTE -  Eliquis  Pharmacy has been assisting with dosing of Eliquis for Aflutter.  Dosage remains stable at 5 mg PO bid and need for further dosage adjustment appears unlikely at present given stable renal function  Pharmacy will sign off, following peripherally for dose adjustments. Please reconsult if a change in clinical status warrants re-evaluation of dosage.  Bernadene Personrew Hendy Brindle, PharmD, BCPS [phone] 01/25/2018, 1:37 PM

## 2018-01-25 NOTE — Discharge Summary (Signed)
Physician Discharge Summary  Stephen Gentry UXL:[id number] DOB: 08/12/1934 DOA: 01/22/2018  PCP: [name], MD  Admit date: 01/22/2018 Discharge date: 01/25/2018  Time spent:  50 minutes  Recommendations for Outpatient Follow-up:  1. Follow-up with [name], MD in 2 weeks.  On follow-up patient's orthostasis will need to be reassessed.  Patient was started on Midrin 5 mg 3 times daily. 2. Follow-up in A. fib clinic/[facility] heart care/Cardiology.  Office will call with appointment time.   Discharge Diagnoses:  Principal Problem:   Orthostatic hypotension Active Problems:   Atrial flutter (HCC)   Parkinson's disease (HCC)   Anxiety   Constipation   Discharge Condition: Stable and improved  Diet recommendation: Heart healthy  Filed Weights   01/23/18 1300  Weight: 76.1 kg    History of present illness:  Per Dr Jaquelyn Bitter Stephen Gentry is a 83 y.o. male with medical history significant of Parkinson's disease diagnosed 5 years ago, chronic diverticulosis, constipation was seen in Dr. Lauro Franklin office was found to have a blood pressure of 60s over 40s and referred the patient to [name], ED for further evaluation.  Patient's blood pressure continued to be around 60/40 [name], ED and in addition to that patient reported some lightheadedness and dizziness and.  He denied any fevers chest pain, shortness of breath, cough, chills, urinary symptoms, nausea, vomiting, abdominal pain, diarrhea, headaches or syncope.   ED Course: Arrival to ED patient was normotensive blood pressure was 60/30, tachycardic at 91/min.  His lab work was significant for WBC count of 3.2 and platelets of 122,000 lactic acid of 0.6.  Urine analysis was negative patient underwent blood cultures.  Chest x-ray does not show any pneumonia EKG shows sinus rhythm.  He was referred to [facility] for evaluation of hypotension.  Hospital Course:  1 orthostatic  hypotension Questionable etiology.  May be secondary to autonomic dysfunction secondary to advanced Parkinson's disease.  Patient with no signs of infection.  Patient with no overt bleeding.  Hemoglobin stable at 13.6.  TSH within normal limits at 3.824.  Random cortisol 11.7.  Lactic acid is 0.6.  Urinalysis was nitrite negative, leukocytes negative.  Repeat orthostasis with some improvement however patient still symptomatic with some dizziness and lightheadedness on ambulation.  Patient was maintained on IV fluids and started on midodrine 2.5 mg 3 times daily which was uptitrated to 5 mg 3 times daily.  Patient improved clinically.  Patient was ambulating in the hallways asymptomatic with physical therapy on day of discharge.  Patient will be discharged home in stable and improved condition.   2.  Atrial flutter Noted on EKG done on admission.  Patient denied any cardiac history.  Transfer to telemetry.  Cardiac enzymes have been ordered and being cycled initial cardiac enzyme was negative.  TSH within normal limits.  2D echo was ordered with a EF of 55 to 60%.  No wall motion abnormalities.  Trivial aortic valvular regurgitation and mild mitral valvular regurgitation.  No PFO.  Case discussed with cardiology and patient with a CHA2DS2VASC score > 2.  Patient started on Eliquis 5 mg twice daily.  Patient will follow-up with cardiology in the A. fib clinic for further evaluation and management in the outpatient setting.  3.  Anxiety Ativan discontinued per wife's insistence as she felt Ativan that patient received early on in the day lead to his combativeness and agitation overnight.   4.  Chronic constipation Patient placed back on  home regimen of MiraLAX and Linzess.  5.  Parkinson's disease Patient noted to have some sundowning during the hospitalization, however per wife feels agitation was secondary to Ativan that was given to patient early on in the day.  Patient was maintained on home  regimen of Sinemet and placed on Risperdal nightly.  Outpatient follow-up with PCP.    Procedures:  2D echo 01/23/2018  Chest x-ray 01/22/2018  Consultations:  Curbside cardiology: Dr. Mayford Knife   Discharge Exam: Vitals:   01/25/18 0816 01/25/18 1243  BP:  (!) 143/76  Pulse:  64  Resp:  18  Temp: 97.6 F (36.4 C) 97.7 F (36.5 C)  SpO2: 98% 100%    General: NAD Cardiovascular: RRR Respiratory: CTAB  Discharge Instructions   Discharge Instructions    Diet - low sodium heart healthy   Complete by:  As directed    Increase activity slowly   Complete by:  As directed      Allergies as of 01/25/2018      Reactions   Tetracyclines & Related Anaphylaxis   Amitiza [lubiprostone] Nausea Only      Medication List    TAKE these medications   apixaban 5 MG Tabs tablet Commonly known as:  ELIQUIS Take 1 tablet (5 mg total) by mouth 2 (two) times daily.   Carbidopa-Levodopa ER 25-100 MG tablet controlled release Commonly known as:  SINEMET CR 2 tabs tid, 1 at bed, 1 at 2 am What changed:    how much to take  how to take this  when to take this  additional instructions   cholecalciferol 10 MCG (400 UNIT) Tabs tablet Commonly known as:  VITAMIN D3 Take 400 Units by mouth.   cyanocobalamin 1000 MCG/ML injection Commonly known as:  (VITAMIN B-12) Inject 1,000 mcg into the muscle every 30 (thirty) days.   escitalopram 20 MG tablet Commonly known as:  LEXAPRO TAKE 1 TABLET BY MOUTH EVERY DAY   FLEET ENEMA RE Place rectally.   linaclotide 290 MCG Caps capsule Commonly known as:  LINZESS Take 1 tablet by mouth once daily.   midodrine 5 MG tablet Commonly known as:  PROAMATINE Take 1 tablet (5 mg total) by mouth 3 (three) times daily with meals.   pantoprazole 40 MG tablet Commonly known as:  PROTONIX Take 1 tablet (40 mg total) by mouth daily at 6 (six) AM. Start taking on:  01/26/2018   polyethylene glycol packet Commonly known as:  MIRALAX /  GLYCOLAX Take 17 g by mouth daily.   simethicone 80 MG chewable tablet Commonly known as:  MYLICON Chew 2 tablets (160 mg total) by mouth 4 (four) times daily as needed for flatulence.      Allergies  Allergen Reactions  . Tetracyclines & Related Anaphylaxis  . Amitiza [Lubiprostone] Nausea Only   Follow-up Information    Newman Nip, NP Follow up.   Specialties:  Nurse Practitioner, Cardiology Why:  our offfice will call you on Monday with an appointment in the atrial fibrillation clinic  Contact information: [address] [place] [address] [phone]        [name], MD. Schedule an appointment as soon as possible for a visit in 2 week(s).   Specialty:  Internal Medicine Contact information: [address] [place] [address] [phone]            The results of significant diagnostics from this hospitalization (including imaging, microbiology, ancillary and laboratory) are listed below for reference.  Significant Diagnostic Studies: Dg Chest 2 View  Result Date: 01/22/2018 CLINICAL DATA:  Weakness and hypotension EXAM: CHEST - 2 VIEW COMPARISON:  None. FINDINGS: Cardiac shadows within normal limits. The lungs are well aerated bilaterally. No focal infiltrate or sizable effusion is seen. Degenerative changes of the thoracic spine are noted. IMPRESSION: No active cardiopulmonary disease. Electronically Signed   By: Alcide CleverMark  Lukens M.D.   On: 01/22/2018 12:07    Microbiology: Recent Results (from the past 240 hour(s))  Urine culture     Status: None   Collection Time: 01/22/18  9:45 AM  Result Value Ref Range Status   Specimen Description   Final    URINE, CLEAN CATCH Performed at [facility], [address] [place], [place] [address]    Special Requests   Final    NONE Performed at [facility], [address] [place], [place] [address]    Culture   Final    NO GROWTH Performed at [facility], [address] [place], [place] [address]    Report Status 01/23/2018 FINAL  Final  Blood culture (routine x 2)     Status: None (Preliminary result)   Collection Time: 01/22/18 10:01 AM  Result Value Ref Range Status   Specimen Description   Final    RIGHT ANTECUBITAL Performed at [facility], [address] [place], [place] [address]    Special Requests   Final    BOTTLES DRAWN AEROBIC AND ANAEROBIC Blood Culture adequate volume Performed at [facility], [address] [place], [place] [address]    Culture   Final    NO GROWTH 3 DAYS Performed at [facility], [address] [place], [place] [address]    Report Status PENDING  Incomplete  Blood culture (routine x 2)     Status: None (Preliminary result)   Collection Time: 01/22/18 10:02 AM  Result Value Ref Range Status   Specimen Description   Final    LEFT ANTECUBITAL Performed at [facility], [address] [place], [place] [address]    Special Requests   Final    BOTTLES DRAWN AEROBIC AND ANAEROBIC Blood Culture adequate volume Performed at [facility], [address] [place], [place] [address]    Culture   Final    NO GROWTH 3 DAYS Performed at [facility], [address] [place], [place] [address]    Report Status PENDING  Incomplete     Labs: Basic Metabolic Panel: Recent Labs  Lab 01/22/18 1001 01/23/18 0452 01/24/18 0922 01/25/18 0440  NA 140 140 139 141  K 3.9 3.9 3.4* 3.7  CL 104 109 106 108  CO2 29 25 25 27   GLUCOSE 85 92 122* 101*  BUN 23 16 15 14   CREATININE 0.81 0.68 0.71 0.76  CALCIUM 9.1 9.0 8.8* 8.6*   Liver Function Tests: Recent Labs  Lab 01/22/18 1001  AST 16  ALT 9  ALKPHOS 59  BILITOT 0.9  PROT 6.0*  ALBUMIN 3.8   No results for input(s): LIPASE, AMYLASE in the last 168 hours. No results for input(s): AMMONIA in the last 168 hours. CBC: Recent Labs  Lab  01/22/18 1001 01/23/18 0452  WBC 3.2* 4.0  NEUTROABS 1.9  --   HGB 13.8 13.6  HCT 42.5 42.0  MCV 94.2 93.8  PLT 122* 121*   Cardiac Enzymes: Recent Labs  Lab 01/23/18 0934 01/23/18 1604  01/23/18 2130  TROPONINI <0.03 <0.03 <0.03   BNP: BNP (last 3 results) No results for input(s): BNP in the last 8760 hours.  ProBNP (last 3 results) No results for input(s): PROBNP in the last 8760 hours.  CBG: No results for input(s): GLUCAP in the last 168 hours.     Signed:  Ramiro Harvest MD.  [facility] 01/25/2018, 2:58 PM

## 2018-01-25 NOTE — Progress Notes (Signed)
Physical Therapy Treatment Patient Details Name: Stephen Gentry MRN: [id number] DOB: 03/21/1934 Today's Date: 01/25/2018    History of Present Illness Pt admitted with orthostatic hypotension and with hx of Parkinsons    PT Comments    Patient progressing with ambulation able to perform appropriate head turns and no LOB noted.  Still lower BP in standing, but without symptoms this session.  Continue to feel patient may return home with spouse/family assist without current follow up PT needs.  Will continue skilled acute PT until d/c.   Follow Up Recommendations  No PT follow up     Equipment Recommendations  None recommended by PT    Recommendations for Other Services       Precautions / Restrictions Precautions Precautions: Fall Precaution Comments: watch bp    Mobility  Bed Mobility Overal bed mobility: Modified Independent                Transfers Overall transfer level: Needs assistance Equipment used: None Transfers: Sit to/from Stand Sit to Stand: Min guard         General transfer comment: steady assist only  Ambulation/Gait Ambulation/Gait assistance: Min guard;Supervision Gait Distance (Feet): 300 Feet Assistive device: None Gait Pattern/deviations: Step-through pattern;Decreased stride length;Trunk flexed;Narrow base of support;Shuffle     General Gait Details: turning head occasionally for environmental scanning without LOB   Stairs             Wheelchair Mobility    Modified Rankin (Stroke Patients Only)       Balance Overall balance assessment: Needs assistance   Sitting balance-Leahy Scale: Good     Standing balance support: No upper extremity supported Standing balance-Leahy Scale: Fair Standing balance comment: able to wash hands at sink, able to stand for BP measurement                            Cognition Arousal/Alertness: Awake/alert Behavior During Therapy: WFL for tasks assessed/performed Overall  Cognitive Status: Within Functional Limits for tasks assessed                                        Exercises      General Comments General comments (skin integrity, edema, etc.): BP supine 131/65; seated 128/68, standing 111/63; after ambulation 114/61 (pt denies orthostatic symptoms today)      Pertinent Vitals/Pain Pain Assessment: No/denies pain    Home Living                      Prior Function            PT Goals (current goals can now be found in the care plan section) Progress towards PT goals: Progressing toward goals    Frequency    Min 3X/week      PT Plan Current plan remains appropriate    Co-evaluation              AM-PAC PT "6 Clicks" Daily Activity  Outcome Measure  Difficulty turning over in bed (including adjusting bedclothes, sheets and blankets)?: A Little Difficulty moving from lying on back to sitting on the side of the bed? : A Little Difficulty sitting down on and standing up from a chair with arms (e.g., wheelchair, bedside commode, etc,.)?: Unable Help needed moving to and from a bed to chair (including a wheelchair)?: A  Little Help needed walking in hospital room?: A Little Help needed climbing 3-5 steps with a railing? : A Little 6 Click Score: 16    End of Session Equipment Utilized During Treatment: Gait belt Activity Tolerance: Patient tolerated treatment well Patient left: in bed;with call bell/phone within reach;with family/visitor present;with bed alarm set   PT Visit Diagnosis: Muscle weakness (generalized) (M62.81)     Time: 1610-9604 PT Time Calculation (min) (ACUTE ONLY): 24 min  Charges:  $Gait Training: 8-22 mins $Therapeutic Activity: 8-22 mins                     Sheran Lawless, PT Acute Rehabilitation Services [phone] 01/25/2018    Stephen Gentry 01/25/2018, 1:12 PM

## 2018-01-27 LAB — CULTURE, BLOOD (ROUTINE X 2)
CULTURE: NO GROWTH
Culture: NO GROWTH
SPECIAL REQUESTS: ADEQUATE
Special Requests: ADEQUATE

## 2018-02-01 ENCOUNTER — Encounter (HOSPITAL_COMMUNITY): Payer: Self-pay | Admitting: Nurse Practitioner

## 2018-02-01 ENCOUNTER — Ambulatory Visit (HOSPITAL_COMMUNITY)
Admission: RE | Admit: 2018-02-01 | Discharge: 2018-02-01 | Disposition: A | Discharge status: Home / Self Care | Payer: Medicare Other | Source: Ambulatory Visit | Attending: Nurse Practitioner | Admitting: Nurse Practitioner

## 2018-02-01 VITALS — BP 122/66 | HR 81 | Ht 73.0 in | Wt 169.0 lb

## 2018-02-01 DIAGNOSIS — G20 Parkinson's disease: Secondary | ICD-10-CM | POA: Diagnosis not present

## 2018-02-01 DIAGNOSIS — K648 Other hemorrhoids: Secondary | ICD-10-CM | POA: Insufficient documentation

## 2018-02-01 DIAGNOSIS — Z79899 Other long term (current) drug therapy: Secondary | ICD-10-CM | POA: Insufficient documentation

## 2018-02-01 DIAGNOSIS — I4891 Unspecified atrial fibrillation: Secondary | ICD-10-CM | POA: Insufficient documentation

## 2018-02-01 DIAGNOSIS — K5909 Other constipation: Secondary | ICD-10-CM | POA: Diagnosis not present

## 2018-02-01 DIAGNOSIS — Z7901 Long term (current) use of anticoagulants: Secondary | ICD-10-CM | POA: Insufficient documentation

## 2018-02-01 DIAGNOSIS — Z87891 Personal history of nicotine dependence: Secondary | ICD-10-CM | POA: Insufficient documentation

## 2018-02-01 DIAGNOSIS — I4892 Unspecified atrial flutter: Secondary | ICD-10-CM

## 2018-02-01 DIAGNOSIS — I959 Hypotension, unspecified: Secondary | ICD-10-CM | POA: Insufficient documentation

## 2018-02-01 DIAGNOSIS — K5790 Diverticulosis of intestine, part unspecified, without perforation or abscess without bleeding: Secondary | ICD-10-CM | POA: Insufficient documentation

## 2018-02-01 NOTE — Patient Instructions (Addendum)
Take off monitor and mail in on 02/15/18
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
## 2018-02-18 ENCOUNTER — Other Ambulatory Visit: Payer: Self-pay | Admitting: Neurology

## 2018-02-18 MED ORDER — CARBIDOPA-LEVODOPA ER 25-100 MG PO TBCR: EXTENDED_RELEASE_TABLET | ORAL | 1 refills | Status: DC

## 2018-02-19 ENCOUNTER — Other Ambulatory Visit (HOSPITAL_COMMUNITY): Payer: Self-pay | Admitting: *Deleted

## 2018-02-19 ENCOUNTER — Encounter: Payer: Self-pay | Admitting: Neurology

## 2018-02-19 ENCOUNTER — Ambulatory Visit (HOSPITAL_COMMUNITY)
Admission: RE | Admit: 2018-02-19 | Discharge: 2018-02-19 | Disposition: A | Discharge status: Home / Self Care | Payer: Medicare Other | Source: Ambulatory Visit | Attending: Nurse Practitioner | Admitting: Nurse Practitioner

## 2018-02-19 ENCOUNTER — Ambulatory Visit (INDEPENDENT_AMBULATORY_CARE_PROVIDER_SITE_OTHER): Payer: Medicare Other | Admitting: Neurology

## 2018-02-19 VITALS — BP 110/60 | HR 86 | Ht 73.0 in | Wt 165.2 lb

## 2018-02-19 DIAGNOSIS — G20 Parkinson's disease: Secondary | ICD-10-CM

## 2018-02-19 DIAGNOSIS — I959 Hypotension, unspecified: Secondary | ICD-10-CM | POA: Diagnosis not present

## 2018-02-19 DIAGNOSIS — F0280 Dementia in other diseases classified elsewhere without behavioral disturbance: Secondary | ICD-10-CM

## 2018-02-19 DIAGNOSIS — G301 Alzheimer's disease with late onset: Secondary | ICD-10-CM

## 2018-02-19 DIAGNOSIS — I4892 Unspecified atrial flutter: Secondary | ICD-10-CM

## 2018-02-19 DIAGNOSIS — I480 Paroxysmal atrial fibrillation: Secondary | ICD-10-CM

## 2018-02-25 ENCOUNTER — Encounter (HOSPITAL_COMMUNITY): Payer: Self-pay | Admitting: Nurse Practitioner

## 2018-02-25 ENCOUNTER — Ambulatory Visit (HOSPITAL_COMMUNITY)
Admission: RE | Admit: 2018-02-25 | Discharge: 2018-02-25 | Disposition: A | Discharge status: Home / Self Care | Payer: Medicare Other | Source: Ambulatory Visit | Attending: Nurse Practitioner | Admitting: Nurse Practitioner

## 2018-02-25 VITALS — BP 98/58 | HR 77 | Ht 73.0 in | Wt 166.8 lb

## 2018-02-25 DIAGNOSIS — Z888 Allergy status to other drugs, medicaments and biological substances status: Secondary | ICD-10-CM | POA: Insufficient documentation

## 2018-02-25 DIAGNOSIS — G20 Parkinson's disease: Secondary | ICD-10-CM | POA: Insufficient documentation

## 2018-02-25 DIAGNOSIS — I498 Other specified cardiac arrhythmias: Secondary | ICD-10-CM | POA: Insufficient documentation

## 2018-02-25 DIAGNOSIS — K5900 Constipation, unspecified: Secondary | ICD-10-CM | POA: Diagnosis not present

## 2018-02-25 DIAGNOSIS — I4892 Unspecified atrial flutter: Secondary | ICD-10-CM | POA: Insufficient documentation

## 2018-02-25 DIAGNOSIS — K5790 Diverticulosis of intestine, part unspecified, without perforation or abscess without bleeding: Secondary | ICD-10-CM | POA: Insufficient documentation

## 2018-02-25 DIAGNOSIS — I951 Orthostatic hypotension: Secondary | ICD-10-CM | POA: Diagnosis present

## 2018-02-25 DIAGNOSIS — Z7901 Long term (current) use of anticoagulants: Secondary | ICD-10-CM | POA: Diagnosis not present

## 2018-02-25 DIAGNOSIS — Z87891 Personal history of nicotine dependence: Secondary | ICD-10-CM | POA: Insufficient documentation

## 2018-02-25 DIAGNOSIS — I471 Supraventricular tachycardia: Secondary | ICD-10-CM | POA: Insufficient documentation

## 2018-02-25 DIAGNOSIS — Z79899 Other long term (current) drug therapy: Secondary | ICD-10-CM | POA: Diagnosis not present

## 2018-02-25 MED ORDER — APIXABAN 5 MG PO TABS: 5.0000 mg | ORAL_TABLET | Freq: Two times a day (BID) | ORAL | 6 refills | Status: DC

## 2018-02-25 MED ORDER — MIDODRINE HCL 5 MG PO TABS: 5.0000 mg | ORAL_TABLET | Freq: Three times a day (TID) | ORAL | 6 refills | Status: DC
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
## 2018-03-23 ENCOUNTER — Encounter: Payer: Self-pay | Admitting: Internal Medicine

## 2018-03-23 ENCOUNTER — Ambulatory Visit (INDEPENDENT_AMBULATORY_CARE_PROVIDER_SITE_OTHER): Payer: Medicare Other | Admitting: Internal Medicine

## 2018-03-23 ENCOUNTER — Ambulatory Visit: Payer: Medicare Other | Admitting: Neurology

## 2018-03-23 VITALS — BP 126/76 | HR 70 | Ht 73.0 in | Wt 165.6 lb

## 2018-03-23 DIAGNOSIS — I951 Orthostatic hypotension: Secondary | ICD-10-CM

## 2018-03-23 DIAGNOSIS — I4892 Unspecified atrial flutter: Secondary | ICD-10-CM | POA: Diagnosis not present

## 2018-03-23 DIAGNOSIS — G20 Parkinson's disease: Secondary | ICD-10-CM | POA: Diagnosis not present

## 2018-03-23 DIAGNOSIS — I480 Paroxysmal atrial fibrillation: Secondary | ICD-10-CM

## 2018-03-23 MED ORDER — MIDODRINE HCL 10 MG PO TABS: 10.0000 mg | ORAL_TABLET | Freq: Three times a day (TID) | ORAL | 3 refills | Status: DC

## 2018-03-23 NOTE — Patient Instructions (Addendum)
Medication Instructions:   If you need a refill on your cardiac medications before your next appointment, please call your pharmacy.   Lab work:  If you have labs (blood work) drawn today and your tests are completely normal, you will receive your results only by: [name] MyChart Message (if you have MyChart) OR . A paper copy in the mail If you have any lab test that is abnormal or we need to change your treatment, we will call you to review the results.  Testing/Procedures: None  Follow-Up: At [facility], you and your health needs are our priority.  As part of our continuing mission to provide you with exceptional heart care, we have created designated Provider Care Teams.  These Care Teams include your primary Cardiologist (physician) and Advanced Practice Providers (APPs -  Physician Assistants and Nurse Practitioners) who all work together to provide you with the care you need, when you need it. . Dr Graciela Husbands in 6-8 weeks  Any Other Special Instructions Will Be Listed Below (If Applicable).
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
## 2018-03-26 ENCOUNTER — Telehealth: Payer: Self-pay | Admitting: Neurology

## 2018-03-26 ENCOUNTER — Ambulatory Visit: Payer: Medicare Other | Admitting: Neurology

## 2018-03-26 DIAGNOSIS — F0280 Dementia in other diseases classified elsewhere without behavioral disturbance: Secondary | ICD-10-CM

## 2018-03-26 DIAGNOSIS — G301 Alzheimer's disease with late onset: Secondary | ICD-10-CM

## 2018-03-26 DIAGNOSIS — G20 Parkinson's disease: Secondary | ICD-10-CM

## 2018-03-26 NOTE — Telephone Encounter (Signed)
Received call from Dr. Nadara Mode.  She is seeing him for research for [facility].  She is to call dr if they score high on depression scores.  Pt on lexapro.  Wife also asked about other tx's for [facility] and I just told her that cardiology is managing his [place] tx.

## 2018-03-26 NOTE — Telephone Encounter (Signed)
Referral faxed to [facility] at [phone] and they will contact patient to schedule evaluation  

## 2018-03-26 NOTE — Telephone Encounter (Signed)
-----   Message from Octaviano Batty Tat, DO sent at 03/26/2018 11:42 AM EST ----- Schedule [facility]

## 2018-04-06 ENCOUNTER — Telehealth: Payer: Self-pay | Admitting: Internal Medicine

## 2018-04-06 NOTE — Telephone Encounter (Signed)
Yes would recommend a MiraLAX bowel purge  MiraLAX 255 g dissolved in 64 ounces of Gatorade -this can be consumed slowly over 2 to 4 hours until intended effect/bowel purge

## 2018-04-06 NOTE — Telephone Encounter (Signed)
Pts wife called and states pt was seen in the er last night at [facility] for abdominal pain. Xray was done and there is not an obstuction, he had lots of stool. ER gave him an enema but only some liquid came out. This am he did a bottle of mag citrate and again just some liquid. He took linzess this am and he has had stomach cramping. She states she thinks there is more stool high up and wonders if he needs a bowel prep. Please advise.

## 2018-04-06 NOTE — Telephone Encounter (Signed)
Pt's wife called to report pt has chronic constipation.  Please advise.

## 2018-04-07 ENCOUNTER — Telehealth: Payer: Self-pay | Admitting: Internal Medicine

## 2018-04-07 ENCOUNTER — Telehealth: Payer: Self-pay

## 2018-04-07 NOTE — Telephone Encounter (Signed)
Wife called back this morning and states pt was in the ER at [facility] again last night. 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 He was given golytely in the ER and had a BM so the cramping is better. Requested CT and xray from [facility]. Please advise.

## 2018-04-07 NOTE — Telephone Encounter (Signed)
Pt's wife called requesting to speak with you, she stated that pt was at the ED last night again.

## 2018-04-07 NOTE — Telephone Encounter (Signed)
We will need permission to hold Eliquis  Regarding prep, would use a high-volume prep.  With higher volume prep 1 day, split dose, standard prep should be sufficient.  Would use GoLYTELY

## 2018-04-07 NOTE — Telephone Encounter (Signed)
Appears Selinda Michaels, RN has already requested clearance and is awaiting response.Marland KitchenMarland Kitchen

## 2018-04-07 NOTE — Telephone Encounter (Signed)
[facility] Pre-operative Risk Assessment     Request for surgical clearance:     Endoscopy Procedure  What type of surgery is being performed?     Colonoscopy  When is this surgery scheduled?     04/15/18  What type of clearance is required ?   Pharmacy  Are there any medications that need to be held prior to surgery and how long? Eliquis for 2 days  Practice name and name of physician performing surgery?      [facility] Dr. Hilarie Fredrickson  What is your office phone and fax number?      Phone- [phone]  Fax847 [phone]  Anesthesia type (None, local, MAC, general) ?       MAC

## 2018-04-07 NOTE — Telephone Encounter (Signed)
See additional phone note from 04/07/18.

## 2018-04-07 NOTE — Telephone Encounter (Signed)
Pts wife aware. Pt scheduled for previsit 04/08/18@2 :30pm, Colon scheduled in the [facility] 04/15/18@2 :30pm. Pts wife aware of appt. Referral faxed to [facility].

## 2018-04-07 NOTE — Telephone Encounter (Signed)
Dr. Rhea BeltonPyrtle Stephen Gentry is coming in tomorrow for a previsit. Do to the hx of constipation should we given them a 2 day prep. Stephen Gentry taking miralax and linzess?Please advise.

## 2018-04-07 NOTE — Telephone Encounter (Signed)
I reviewed the chart including the CT scan abdomen pelvis and abdominal plain film performed earlier today in the [facility] ER On presentation CT showed swirling of the mesentery in the sigmoid concerning for volvulus.  Apparently he had a large bowel movement and follow-up x-ray showed colonic distention but improvement compared to CT. His abdominal pain improved per wife.  In general sigmoid volvulus is treated acutely with decompression flexible sigmoidoscopy and then surgical consultation.  However he improved, had a large bowel movement and was sent home.  My suspicion is that GI was not available at his local hospital.  My recommendation is that he have surgical consultation as sigmoid resection versus pexy is indicated.  Volvulus is a surgical matter. Medical treatment includes laxatives to help guard against constipation.  Also he should be as active as possible, this is limited by his Parkinson's. If symptoms including abdominal pain and acute abdominal distention have resolved and he can resume laxatives.  Would resume Linzess 290 mcg daily and he needs to be more regular with MiraLAX 17 g twice daily.  In the event of recurrent volvulus, which is likely if not treated surgically, he would need to come to a [facility] emergency department for management.  I would recommend urgent [facility] referral.  Would also please arrange colonoscopy in the [facility], assuming no acute issues in the coming days to weeks.  He had a normal colonoscopy with the exception of diverticulosis 2 years ago in late 2017 but I expect surgery would want this repeated prior to operative management.

## 2018-04-08 ENCOUNTER — Ambulatory Visit (AMBULATORY_SURGERY_CENTER): Payer: Self-pay

## 2018-04-08 ENCOUNTER — Telehealth: Payer: Self-pay

## 2018-04-08 VITALS — Ht 73.0 in | Wt 168.0 lb

## 2018-04-08 DIAGNOSIS — R933 Abnormal findings on diagnostic imaging of other parts of digestive tract: Secondary | ICD-10-CM

## 2018-04-08 MED ORDER — PEG 3350-KCL-NA BICARB-NACL 420 G PO SOLR: 4000.0000 mL | Freq: Once | ORAL | 0 refills | Status: AC

## 2018-04-08 NOTE — Progress Notes (Signed)
Denies allergies to eggs or soy products. Denies complication of anesthesia or sedation. Denies use of weight loss medication. Denies use of O2.   Emmi instructions declined.   Patient is on Eliquis and Selinda Michaels sent a note to Cardiology to get clearance to hold Eliquis prior to colonoscopy. We have not heard back yet but the patients wife was instructed that if she has not heard anything by Monday to call our office and speak to Dr. Lauro Franklin nurse regarding when to stop Eliquis. Note was sent on 04/07/18. Patient verbalizes understanding.

## 2018-04-08 NOTE — Telephone Encounter (Signed)
-----   Message from Ethel Rana sent at 04/08/2018  3:48 PM EST ----- Regarding: RE: Urgent referral Appt 04/16/18 at 9:50 Dr.blackman ----- Message ----- From: Chrystie Nose, RN Sent: 04/08/2018  10:02 AM EST To: Ethel Rana Subject: Urgent referral                                Urgent Referral faxed yesterday for sigmoid volvulus, just checking on appt status.  Thanks, [employer]

## 2018-04-09 ENCOUNTER — Telehealth: Payer: Self-pay | Admitting: Internal Medicine

## 2018-04-09 NOTE — Telephone Encounter (Signed)
Routing to pharmacy to address  anticoagulation 

## 2018-04-09 NOTE — Telephone Encounter (Signed)
Left message for pts wife to call back. Received ok for pt to hold Eliquis for 2 days prior to procedure.

## 2018-04-09 NOTE — Telephone Encounter (Signed)
PT wife states that you called... she would like to know when her husband has to stop taking his BT... Has a procedure on 04-15-18.JG

## 2018-04-09 NOTE — Telephone Encounter (Signed)
Patient with diagnosis of atrial fibrillation on Eliquis for anticoagulation.    Procedure: colonoscopy Date of procedure: 04/15/18  CHADS2-VASc score of  2 ( AGE,  AGE,)  CrCl 81 Platelet count 121  Per office protocol, patient can hold Eliquis for 2 days prior to procedure.    Patient should restart Eliquis on the evening of procedure or day after, at discretion of procedure MD

## 2018-04-09 NOTE — Telephone Encounter (Signed)
See additional phone note. 

## 2018-04-09 NOTE — Telephone Encounter (Signed)
Spoke with pts wife and she is aware. 

## 2018-04-15 ENCOUNTER — Ambulatory Visit (AMBULATORY_SURGERY_CENTER): Payer: Medicare Other | Admitting: Internal Medicine

## 2018-04-15 ENCOUNTER — Encounter: Payer: Self-pay | Admitting: Internal Medicine

## 2018-04-15 VITALS — BP 126/65 | HR 68 | Temp 97.3°F | Resp 19

## 2018-04-15 DIAGNOSIS — K5909 Other constipation: Secondary | ICD-10-CM | POA: Diagnosis not present

## 2018-04-15 DIAGNOSIS — R933 Abnormal findings on diagnostic imaging of other parts of digestive tract: Secondary | ICD-10-CM | POA: Diagnosis present

## 2018-04-15 MED ORDER — SODIUM CHLORIDE 0.9 % IV SOLN: 500.0000 mL | Freq: Once | INTRAVENOUS | Status: DC

## 2018-04-15 NOTE — Patient Instructions (Signed)
Continue present medications, including laxative regimen Please read handouts provided.       YOU HAD AN ENDOSCOPIC PROCEDURE TODAY AT THE [facility]:   Refer to the procedure report that was given to you for any specific questions about what was found during the examination.  If the procedure report does not answer your questions, please call your gastroenterologist to clarify.  If you requested that your care partner not be given the details of your procedure findings, then the procedure report has been included in a sealed envelope for you to review at your convenience later.  YOU SHOULD EXPECT: Some feelings of bloating in the abdomen. Passage of more gas than usual.  Walking can help get rid of the air that was put into your GI tract during the procedure and reduce the bloating. If you had a lower endoscopy (such as a colonoscopy or flexible sigmoidoscopy) you may notice spotting of blood in your stool or on the toilet paper. If you underwent a bowel prep for your procedure, you may not have a normal bowel movement for a few days.  Please Note:  You might notice some irritation and congestion in your nose or some drainage.  This is from the oxygen used during your procedure.  There is no need for concern and it should clear up in a day or so.  SYMPTOMS TO REPORT IMMEDIATELY:   Following lower endoscopy (colonoscopy or flexible sigmoidoscopy):  Excessive amounts of blood in the stool  Significant tenderness or worsening of abdominal pains  Swelling of the abdomen that is new, acute  Fever of 100F or higher    For urgent or emergent issues, a gastroenterologist can be reached at any hour by calling [phone].   DIET:  We do recommend a small meal at first, but then you may proceed to your regular diet.  Drink plenty of fluids but you should avoid alcoholic beverages for 24 hours.  ACTIVITY:  You should plan to take it easy for the rest of today and you should NOT  DRIVE or use heavy machinery until tomorrow (because of the sedation medicines used during the test).    FOLLOW UP: Our staff will call the number listed on your records the next business day following your procedure to check on you and address any questions or concerns that you may have regarding the information given to you following your procedure. If we do not reach you, we will leave a message.  However, if you are feeling well and you are not experiencing any problems, there is no need to return our call.  We will assume that you have returned to your regular daily activities without incident.  If any biopsies were taken you will be contacted by phone or by letter within the next 1-3 weeks.  Please call us at [phone] if you have not heard about the biopsies in 3 weeks.    SIGNATURES/CONFIDENTIALITY: You and/or your care partner have signed paperwork which will be entered into your electronic medical record.  These signatures attest to the fact that that the information above on your After Visit Summary has been reviewed and is understood.  Full responsibility of the confidentiality of this discharge information lies with you and/or your care-partner.

## 2018-04-15 NOTE — Progress Notes (Signed)
PT taken to PACU. Monitors in place. VSS. Report given to RN. 

## 2018-04-15 NOTE — Progress Notes (Signed)
Whole anterior forearm has bruising, noted at admission when looking for IV placement.

## 2018-04-15 NOTE — Progress Notes (Signed)
Pt's states no medical or surgical changes since previsit or office visit. 

## 2018-04-15 NOTE — Op Note (Signed)
[facility] Patient Name: Stephen Gentry Procedure Date: 04/15/2018 2:50 PM MRN: [id number] Endoscopist: Beverley Fiedler , MD Age: 84 Referring MD:  Date of Birth: 20-Sep-1934 Gender: Male Account #: [id number] Procedure:                Colonoscopy Indications:              Abnormal CT of the GI tract, Follow-up of volvulus                            seen recently by CT scan, chronic constipation Medicines:                Monitored Anesthesia Care Procedure:                Pre-Anesthesia Assessment:                           - Prior to the procedure, a History and Physical                            was performed, and patient medications and                            allergies were reviewed. The patient's tolerance of                            previous anesthesia was also reviewed. The risks                            and benefits of the procedure and the sedation                            options and risks were discussed with the patient.                            All questions were answered, and informed consent                            was obtained. Prior Anticoagulants: The patient has                            taken no previous anticoagulant or antiplatelet                            agents. ASA Grade Assessment: III - A patient with                            severe systemic disease. After reviewing the risks                            and benefits, the patient was deemed in                            satisfactory condition to undergo the procedure.  After obtaining informed consent, the colonoscope                            was passed under direct vision. Throughout the                            procedure, the patient's blood pressure, pulse, and                            oxygen saturations were monitored continuously. The                            Colonoscope was introduced through the anus and                            advanced to the  cecum, identified by appendiceal                            orifice and ileocecal valve. The colonoscopy was                            performed without difficulty. The patient tolerated                            the procedure well. The quality of the bowel                            preparation was adequate and fair. The ileocecal                            valve, appendiceal orifice, and rectum were                            photographed. Scope In: 3:01:15 PM Scope Out: 3:24:08 PM Scope Withdrawal Time: 0 hours 12 minutes 28 seconds  Total Procedure Duration: 0 hours 22 minutes 53 seconds  Findings:                 The digital rectal exam was normal.                           The colon (entire examined portion) was moderately                            redundant.                           Internal hemorrhoids were found during                            retroflexion. The hemorrhoids were small.                           The exam was otherwise without abnormality. Complications:            No immediate complications. Estimated Blood Loss:  Estimated blood loss: none. Impression:               - Preparation of the colon was fair but adequate                            for this examination.                           - Redundant colon.                           - Internal hemorrhoids.                           - The examination was otherwise normal (no polyps                            or evidence of inflammation/ischemia).                           - No specimens collected. Recommendation:           - Patient has a contact number available for                            emergencies. The signs and symptoms of potential                            delayed complications were discussed with the                            patient. Return to normal activities tomorrow.                            Written discharge instructions were provided to the                            patient.                            - Resume previous diet.                           - Continue present medications including laxative                            regimen.                           - No repeat colonoscopy due to age.                           - Refer to a colo-rectal surgeon at appointment to                            be scheduled to discuss surgical management of  volvulus (given the very high recurrence rate). Beverley Fiedler, MD 04/15/2018 3:30:55 PM This report has been signed electronically.

## 2018-04-16 ENCOUNTER — Telehealth: Payer: Self-pay | Admitting: *Deleted

## 2018-04-16 ENCOUNTER — Other Ambulatory Visit: Payer: Self-pay | Admitting: Surgery

## 2018-04-16 NOTE — Telephone Encounter (Signed)
   [facility] Pre-operative Risk Assessment    Request for surgical clearance:  1. What type of surgery is being performed? LAPRASCOPIC ASSISTED PARTIAL COLECTOMY   2. When is this surgery scheduled?  TBD (awaiting on clearance)   3. What type of clearance is required (medical clearance vs. Pharmacy clearance to hold med vs. Both)?  BOTH   4. Are there any medications that need to be held prior to surgery and how long? ELIQUIS upon Dr's recommendation as to how long pt can be off   5. Practice name and name of physician performing surgery?  [facility] DR. BLACKMAN   6. What is your office phone number [phone]    7.   What is your office fax number [phone]  8.   Anesthesia type (None, local, MAC, general) ?  GENERAL   Jeanann Lewandowsky 04/16/2018, 3:17 PM  _________________________________________________________________   (provider comments below)

## 2018-04-16 NOTE — Telephone Encounter (Signed)
Patient takes Eliquis for afib with CHADS2VASc score of 2 (age x2). Renal function is normal. Recommend holding Eliquis for 2 days prior to procedure.

## 2018-04-16 NOTE — Telephone Encounter (Signed)
  Follow up Call-  Call back number 04/15/2018  Post procedure Call Back phone  # [phone]  Permission to leave phone message Yes  Some recent data might be hidden     Patient questions:  Do you have a fever, pain , or abdominal swelling? No. Pain Score  0 *  Have you tolerated food without any problems? Yes.    Have you been able to return to your normal activities? Yes.    Do you have any questions about your discharge instructions: Diet   No. Medications  No. Follow up visit  No.  Do you have questions or concerns about your Care? No.  Actions: * If pain score is 4 or above: No action needed, pain <4.

## 2018-04-20 NOTE — Telephone Encounter (Signed)
   Primary Cardiologist: Rudi Coco of Afib clinic  Chart reviewed as part of pre-operative protocol coverage. Patient was contacted 04/20/2018 in reference to pre-operative risk assessment for pending surgery as outlined below.  Stephen Gentry was last seen on 03/23/2018 by Dr. Graciela Husbands.  Since that day, Stephen Gentry has done well without chest pain or shortness of breath. He is able to complete 4 METS of activity without any issue  Therefore, based on ACC/AHA guidelines, the patient would be at acceptable risk for the planned procedure without further cardiovascular testing.   I will route this recommendation to the requesting party via Epic fax function and remove from pre-op pool.  Please call with questions. Per our clinical pharmacist: Recommend holding Eliquis for 2 days prior to procedure.  Marty, [place] 04/20/2018, 3:46 PM

## 2018-04-21 ENCOUNTER — Telehealth: Payer: Self-pay | Admitting: Internal Medicine

## 2018-04-21 ENCOUNTER — Ambulatory Visit (INDEPENDENT_AMBULATORY_CARE_PROVIDER_SITE_OTHER)
Admission: RE | Admit: 2018-04-21 | Discharge: 2018-04-21 | Disposition: A | Discharge status: Home / Self Care | Payer: Medicare Other | Source: Ambulatory Visit | Attending: Internal Medicine | Admitting: Internal Medicine

## 2018-04-21 DIAGNOSIS — K5909 Other constipation: Secondary | ICD-10-CM

## 2018-04-21 DIAGNOSIS — R933 Abnormal findings on diagnostic imaging of other parts of digestive tract: Secondary | ICD-10-CM | POA: Diagnosis not present

## 2018-04-21 NOTE — Telephone Encounter (Signed)
Xray has still not been read. I left a message

## 2018-04-21 NOTE — Telephone Encounter (Signed)
Results not available.  Will call back when they are available.

## 2018-04-21 NOTE — Telephone Encounter (Signed)
Wife will bring the patient for x-ray.  She verbalized understanding of the instructions for bowel purge if x ray is negative.  They are hoping for surgery next week with Dr. Magnus Ivan- waiting on insurance approval.

## 2018-04-21 NOTE — Telephone Encounter (Signed)
2 view abdomen results still not back. I had Dr. Leone Payor review images in [facility] system this pm.  Okay to begin Miralax purge.  Wife notified.  She is asked to go to ED if he has pain or has vomiting after starting the prep.  She verbalized understanding.  Patient is scheduled for resection on 04/26/18 with Dr. Magnus Ivan.

## 2018-04-21 NOTE — Telephone Encounter (Signed)
Pt had colonoscopy Apr 15 2018.  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  Please CB.

## 2018-04-21 NOTE — Telephone Encounter (Signed)
Pt's wife requested a call back.  She is very concerned.

## 2018-04-21 NOTE — Telephone Encounter (Signed)
Agree with 2 v abd xray Would recommend bowel purge, 1/2 miralax prep, repeat if no result If xray shows volvulus then will need to go to the ER Saw gen surg this week.  Surg recommended.  I think he needs to have this operation given high likelihood for recurrent volvulus

## 2018-04-21 NOTE — Telephone Encounter (Signed)
Patient's wife reports no BM since procedure on 2/6/230. Wife is very concerned due to hx of sigmoid volvulus. Patient has his "usual gas pains, similar to his normal habits".  He specifically denies any constant abdominal pain, fever, rectal bleeding, nausea or vomiting.  He is tolerating a normal diet for him.  He tried a fleet enema with only "liquid and excess gas".  Wife is very concerned about a repeat volvulus episode.  She is asking if he needs to go to the ED.  I reassured her that at this point there was no reason for the ED and there were no worrisome symptoms.  I discussed it may take a while to reestablish his normal bowel habits after a colonoscopy.  [name] they would feel better if they could come for at least an x-ray if you don't feel the ED is necessary.  Please advise

## 2018-04-21 NOTE — Telephone Encounter (Signed)
Pt wife called wanted to know is the lab results aval.

## 2018-04-22 ENCOUNTER — Encounter (HOSPITAL_COMMUNITY): Payer: Self-pay

## 2018-04-22 ENCOUNTER — Other Ambulatory Visit: Payer: Self-pay

## 2018-04-22 ENCOUNTER — Encounter (HOSPITAL_COMMUNITY)
Admission: RE | Admit: 2018-04-22 | Discharge: 2018-04-22 | Disposition: A | Discharge status: Home / Self Care | Payer: Medicare Other | Source: Ambulatory Visit | Attending: Surgery | Admitting: Surgery

## 2018-04-22 DIAGNOSIS — Z01812 Encounter for preprocedural laboratory examination: Secondary | ICD-10-CM | POA: Diagnosis not present

## 2018-04-22 DIAGNOSIS — K562 Volvulus: Secondary | ICD-10-CM | POA: Insufficient documentation

## 2018-04-22 HISTORY — DX: Paresthesia of skin: R20.0

## 2018-04-22 HISTORY — DX: Alzheimer's disease, unspecified: G30.9

## 2018-04-22 HISTORY — DX: Paresthesia of skin: R20.2

## 2018-04-22 HISTORY — DX: Disorder of the autonomic nervous system, unspecified: G90.9

## 2018-04-22 HISTORY — DX: Essential tremor: G25.0

## 2018-04-22 HISTORY — DX: Presence of spectacles and contact lenses: Z97.3

## 2018-04-22 HISTORY — DX: Paroxysmal atrial fibrillation: I48.0

## 2018-04-22 HISTORY — DX: Unspecified osteoarthritis, unspecified site: M19.90

## 2018-04-22 HISTORY — DX: Orthostatic hypotension: I95.1

## 2018-04-22 HISTORY — DX: Dementia in other diseases classified elsewhere, unspecified severity, without behavioral disturbance, psychotic disturbance, mood disturbance, and anxiety: F02.80

## 2018-04-22 HISTORY — DX: Nocturia: R35.1

## 2018-04-22 HISTORY — DX: Long term (current) use of anticoagulants: Z79.01

## 2018-04-22 LAB — CBC
HCT: 42.2 % (ref 39.0–52.0)
Hemoglobin: 13.7 g/dL (ref 13.0–17.0)
MCH: 30.8 pg (ref 26.0–34.0)
MCHC: 32.5 g/dL (ref 30.0–36.0)
MCV: 94.8 fL (ref 80.0–100.0)
Platelets: 149 10*3/uL — ABNORMAL LOW (ref 150–400)
RBC: 4.45 MIL/uL (ref 4.22–5.81)
RDW: 13.2 % (ref 11.5–15.5)
WBC: 3.5 10*3/uL — ABNORMAL LOW (ref 4.0–10.5)
nRBC: 0 % (ref 0.0–0.2)

## 2018-04-22 LAB — BASIC METABOLIC PANEL
Anion gap: 5 (ref 5–15)
BUN: 17 mg/dL (ref 8–23)
CO2: 30 mmol/L (ref 22–32)
Calcium: 9.3 mg/dL (ref 8.9–10.3)
Chloride: 104 mmol/L (ref 98–111)
Creatinine, Ser: 0.8 mg/dL (ref 0.61–1.24)
GFR calc Af Amer: >60 mL/min (ref >60)
GFR calc non Af Amer: >60 mL/min (ref >60)
GLUCOSE: 94 mg/dL (ref 70–99)
Potassium: 4.2 mmol/L (ref 3.5–5.1)
Sodium: 139 mmol/L (ref 135–145)

## 2018-04-22 MED ORDER — CHLORHEXIDINE GLUCONATE CLOTH 2 % EX PADS
6.0000 | MEDICATED_PAD | Freq: Once | CUTANEOUS | Status: DC
  Filled 2018-04-22: qty 6

## 2018-04-22 NOTE — Patient Instructions (Signed)
Stephen Gentry  04/22/2018   Your procedure is scheduled on:   04-26-2018    Report to [facility] Main  Entrance,  Report to admitting at 5:30 AM    Call this number if you have problems the morning of surgery [phone]               Special Instructions:   Follow dr Magnus Ivan bowl instructions given to you by office.     Remember: DRINK 2 PRESURGERY ENSURE DRINKS THE NIGHT BEFORE SURGERY AT  1000 PM .   NO SOLIDS AFTER MIDNIGHT THE DAY PRIOR TO THE SURGERY.  NOTHING BY MOUTH EXCEPT CLEAR LIQUIDS UNTIL THREE HOURS PRIOR TO SCHEDULED SURGERY 4:30 AM. PLEASE FINISH PRESURGERY ENSURE DRINK PER SURGEON ORDER 3 HOURS PRIOR TO SCHEDULED SURGERY TIME WHICH NEEDS TO BE COMPLETED AT  4:30 AM.   NOTHING BY MOUTH AFTER 4:30 AM INCLUDING WATER, CANDY, GUM, MINTS.    BRUSH YOUR TEETH MORNING OF SURGERY AND RINSE YOUR MOUTH     Take these medicines the morning of surgery with A SIP OF WATER:   Cabidopa-levodopa (sinemet), Midodrine                                  You may not have any metal on your body including hair pins and              piercings  Do not wear jewelry, make-up, lotions, powders or perfumes, deodorant             Do not wear nail polish.  Do not shave  48 hours prior to surgery.   Men may shave face and neck.      Do not bring valuables to the hospital. [facility] IS NOT             RESPONSIBLE   FOR VALUABLES.  Contacts, dentures or bridgework may not be worn into surgery.  Leave suitcase in the car. After surgery it may be brought to your room.    _____________________________________________________________________     CLEAR LIQUID DIET   Foods Allowed                                                                     Foods Excluded  Coffee and tea, regular and decaf                             liquids that you cannot  Plain Jell-O in any flavor                                             see through such as: Fruit ices (not with  fruit pulp)                                     milk, soups, orange juice  Iced  Popsicles                                    All solid food Carbonated beverages, regular and diet                                    Cranberry, grape and apple juices Sports drinks like Gatorade Lightly seasoned clear broth or consume(fat free) Sugar, honey syrup   _____________________________________________________________________            [facility] Before surgery, you can play an important role.  Because skin is not sterile, your skin needs to be as free of germs as possible.  You can reduce the number of germs on your skin by washing with CHG (chlorahexidine gluconate) soap before surgery.  CHG is an antiseptic cleaner which kills germs and bonds with the skin to continue killing germs even after washing. Please DO NOT use if you have an allergy to CHG or antibacterial soaps.  If your skin becomes reddened/irritated stop using the CHG and inform your nurse when you arrive at Short Stay. Do not shave (including legs and underarms) for at least 48 hours prior to the first CHG shower.  You may shave your face/neck. Please follow these instructions carefully:  1.  Shower with CHG Soap the night before surgery and the  morning of Surgery.  2.  If you choose to wash your hair, wash your hair first as usual with your  normal  shampoo.  3.  After you shampoo, rinse your hair and body thoroughly to remove the  shampoo.                            4.  Use CHG as you would any other liquid soap.  You can apply chg directly  to the skin and wash                       Gently with a scrungie or clean washcloth.  5.  Apply the CHG Soap to your body ONLY FROM THE NECK DOWN.   Do not use on face/ open                           Wound or open sores. Avoid contact with eyes, ears mouth and genitals (private parts).                       Wash face,  Genitals (private parts) with your normal soap.              6.  Wash thoroughly, paying special attention to the area where your surgery  will be performed.  7.  Thoroughly rinse your body with warm water from the neck down.  8.  DO NOT shower/wash with your normal soap after using and rinsing off  the CHG Soap.             9.  Pat yourself dry with a clean towel.            10.  Wear clean pajamas.            11.  Place clean sheets on your bed the night of your  first shower and do not  sleep with pets. Day of Surgery : Do not apply any lotions/deodorants the morning of surgery.  Please wear clean clothes to the hospital/surgery center.  FAILURE TO FOLLOW THESE INSTRUCTIONS MAY RESULT IN THE CANCELLATION OF YOUR SURGERY PATIENT SIGNATURE_________________________________  NURSE SIGNATURE__________________________________  ________________________________________________________________________   [name]  An incentive spirometer is a tool that can help keep your lungs clear and active. This tool measures how well you are filling your lungs with each breath. Taking long deep breaths may help reverse or decrease the chance of developing breathing (pulmonary) problems (especially infection) following:  A long period of time when you are unable to move or be active. BEFORE THE PROCEDURE   If the spirometer includes an indicator to show your best effort, your nurse or respiratory therapist will set it to a desired goal.  If possible, sit up straight or lean slightly forward. Try not to slouch.  Hold the incentive spirometer in an upright position. INSTRUCTIONS FOR USE  1. Sit on the edge of your bed if possible, or sit up as far as you can in bed or on a chair. 2. Hold the incentive spirometer in an upright position. 3. Breathe out normally. 4. Place the mouthpiece in your mouth and seal your lips tightly around it. 5. Breathe in slowly and as deeply as possible, raising the piston or the ball toward the top of the  column. 6. Hold your breath for 3-5 seconds or for as long as possible. Allow the piston or ball to fall to the bottom of the column. 7. Remove the mouthpiece from your mouth and breathe out normally. 8. Rest for a few seconds and repeat Steps 1 through 7 at least 10 times every 1-2 hours when you are awake. Take your time and take a few normal breaths between deep breaths. 9. The spirometer may include an indicator to show your best effort. Use the indicator as a goal to work toward during each repetition. 10. After each set of 10 deep breaths, practice coughing to be sure your lungs are clear. If you have an incision (the cut made at the time of surgery), support your incision when coughing by placing a pillow or rolled up towels firmly against it. Once you are able to get out of bed, walk around indoors and cough well. You may stop using the incentive spirometer when instructed by your caregiver.  RISKS AND COMPLICATIONS  Take your time so you do not get dizzy or light-headed.  If you are in pain, you may need to take or ask for pain medication before doing incentive spirometry. It is harder to take a deep breath if you are having pain. AFTER USE  Rest and breathe slowly and easily.  It can be helpful to keep track of a log of your progress. Your caregiver can provide you with a simple table to help with this. If you are using the spirometer at home, follow these instructions: SEEK MEDICAL CARE IF:   You are having difficultly using the spirometer.  You have trouble using the spirometer as often as instructed.  Your pain medication is not giving enough relief while using the spirometer.  You develop fever of 100.5 F (38.1 C) or higher. SEEK IMMEDIATE MEDICAL CARE IF:   You cough up bloody sputum that had not been present before.  You develop fever of 102 F (38.9 C) or greater.  You develop worsening pain at or near the incision site.  MAKE SURE YOU:   Understand these  instructions.  Will watch your condition.  Will get help right away if you are not doing well or get worse. Document Released: 07/07/2006 Document Revised: 05/19/2011 Document Reviewed: 09/07/2006 [facility] Patient Information 2014 [place], [place].   ________________________________________________________________________

## 2018-04-22 NOTE — Progress Notes (Signed)
EKG dated 03-23-2018 in epic. CXR datted 01-22-2018 in epic. ECHO dated 01-23-2018 in epic.  Cardiology, dr Graciela Husbands, lov note dated 03-23-2018 in epic. Cardiac telephone clearance dated 04-20-2018 in epic.

## 2018-04-23 LAB — HEMOGLOBIN A1C
Hgb A1c MFr Bld: 5.3 % (ref 4.8–5.6)
Mean Plasma Glucose: 105 mg/dL

## 2018-04-23 NOTE — Anesthesia Preprocedure Evaluation (Addendum)
Anesthesia Evaluation  Patient identified by MRN, date of birth, ID band Patient awake    Reviewed: Allergy & Precautions, H&P , NPO status , Patient's Chart, lab work & pertinent test results, reviewed documented beta blocker date and time   Airway Mallampati: II  TM Distance: >3 FB Neck ROM: full    Dental no notable dental hx.    Pulmonary neg pulmonary ROS, former smoker,    Pulmonary exam normal breath sounds clear to auscultation       Cardiovascular Exercise Tolerance: Good hypertension,  Rhythm:regular Rate:Normal  03/23/2018 Rate 70 bpm Sinus rhythm with premature atrial complexes Otherwise normal ECG  CV: Echo 01/23/18 Study Conclusions Left ventricle: The cavity size was normal. Systolic function was normal. The estimated ejection fraction was in the range of 55% to 60%. Wall motion was normal; there were no regional wall motion abnormalities. - Aortic valve: There was trivial regurgitation. - Mitral valve: There was mild regurgitation. - Left atrium: The atrium was mildly dilated.   Neuro/Psych Anxiety Depression Parkinson's disease Alzheimer's Dementia  Neuromuscular disease negative psych ROS   GI/Hepatic negative GI ROS, Neg liver ROS,   Endo/Other  negative endocrine ROS  Renal/GU negative Renal ROS  negative genitourinary   Musculoskeletal   Abdominal   Peds  Hematology  (+) Blood dyscrasia, anemia ,   Anesthesia Other Findings  Cardiac clearance received 04/20/18.  Per [place], [place], "Stephen Gentry last seen on 1/14/2020by Dr. Graciela Husbands. Since that day, Stephen Gentry done well without chest pain or shortness of breath.He is able to complete 4 METS of activity without any issue.  Therefore, based on ACC/AHA guidelines, the patient would be at acceptable risk for the planned procedure without further cardiovascular testing.  Reproductive/Obstetrics negative OB ROS                            Anesthesia Physical Anesthesia Plan  ASA: IV  Anesthesia Plan: General   Post-op Pain Management:    Induction: Intravenous  PONV Risk Score and Plan: 2 and Treatment may vary due to age or medical condition and Ondansetron  Airway Management Planned: Oral ETT  Additional Equipment:   Intra-op Plan:   Post-operative Plan: Extubation in OR  Informed Consent: I have reviewed the patients History and Physical, chart, labs and discussed the procedure including the risks, benefits and alternatives for the proposed anesthesia with the patient or authorized representative who has indicated his/her understanding and acceptance.     Dental Advisory Given  Plan Discussed with: CRNA, Anesthesiologist and Surgeon  Anesthesia Plan Comments: (See PST note 04/22/18, Jodell Cipro, PA-C)      Anesthesia Quick Evaluation
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
## 2018-04-25 NOTE — H&P (Signed)
Stephen Gentry Documented: 04/16/2018 9:56 AM Location: [facility] Patient #: [id number] DOB: December 02, 1934 Married / Language: [place] / Race: White Male   History of Present Illness Stephen Lam A. Stephen Ivan MD; 04/16/2018 10:24 AM) The patient is a 84 year old male who presents with colonic obstruction. This gentleman is referred by Dr. Erick Gentry for the recent diagnosis of a possible sigmoid volvulus. He has multiple medical issues including Parkinson's disease. He's been having worsening constipation and abdominal pain over the past year. Most recently, he went to the emergency department in [place]. His belly pain resolved after a large bowel movement. His CAT scan suggested a sigmoid volvulus with swirling in the mesentery. He has since had a colonoscopy which shows a large redundant colon. He is currently on blood thinning medication and sees a cardiologist.   Past Surgical History (Stephen Gentry, RMA; 04/16/2018 9:56 AM) Cataract Surgery  Right. Hemorrhoidectomy  Tonsillectomy  Vasectomy   Diagnostic Studies History (Stephen Gentry, RMA; 04/16/2018 9:56 AM) Colonoscopy  within last year  Allergies (Stephen Gentry, RMA; 04/16/2018 9:59 AM) Tetracycline *CHEMICALS*  Anaphylaxis. Ativan *ANTIANXIETY AGENTS*  hallucinations Allergies Reconciled   Medication History (Stephen Gentry, RMA; 04/16/2018 10:00 AM) Carbidopa-Levodopa (25-100MG  Tablet, Oral) Active. Eliquis (5MG  Tablet, Oral) Active. Linzess ( Capsule, Oral) Active. Midodrine HCl (10MG  Tablet, Oral) Active. Vitamin B Complex (Oral) Active. Vitamin D3 (Oral) Specific strength unknown - Active. Medications Reconciled  Social History (Stephen Gentry, RMA; 04/16/2018 9:56 AM) Alcohol use  Remotely quit alcohol use. Caffeine use  Tea. No drug use  Tobacco use  Former smoker.  Family History (Stephen Gentry, RMA; 04/16/2018 9:56 AM) Respiratory Condition  Father, Mother.  Other  Problems (Stephen Gentry, RMA; 04/16/2018 9:56 AM) Anxiety Disorder  Arthritis  Atrial Fibrillation  Hemorrhoids     Review of Systems (Stephen Gentry RMA; 04/16/2018 9:56 AM) General Present- Appetite Loss, Fatigue and Weight Loss. Not Present- Chills, Fever, Night Sweats and Weight Gain. Skin Present- Dryness. Not Present- Change in Wart/Mole, Hives, Jaundice, New Lesions, Non-Healing Wounds, Rash and Ulcer. HEENT Present- Wears glasses/contact lenses. Not Present- Earache, Hearing Loss, Hoarseness, Nose Bleed, Oral Ulcers, Ringing in the Ears, Seasonal Allergies, Sinus Pain, Sore Throat, Visual Disturbances and Yellow Eyes. Respiratory Not Present- Bloody sputum, Chronic Cough, Difficulty Breathing, Snoring and Wheezing. Breast Not Present- Breast Mass, Breast Pain, Nipple Discharge and Skin Changes. Cardiovascular Present- Shortness of Breath. Not Present- Chest Pain, Difficulty Breathing Lying Down, Leg Cramps, Palpitations, Rapid Heart Rate and Swelling of Extremities. Gastrointestinal Present- Abdominal Pain, Bloating, Constipation, Excessive gas, Hemorrhoids and Nausea. Not Present- Bloody Stool, Change in Bowel Habits, Chronic diarrhea, Difficulty Swallowing, Gets full quickly at meals, Indigestion, Rectal Pain and Vomiting. Male Genitourinary Present- Frequency and Nocturia. Not Present- Blood in Urine, Change in Urinary Stream, Impotence, Painful Urination, Urgency and Urine Leakage. Neurological Present- Decreased Memory, Numbness, Tremor, Trouble walking and Weakness. Not Present- Fainting, Headaches, Seizures and Tingling. Psychiatric Present- Anxiety. Not Present- Bipolar, Change in Sleep Pattern, Depression, Fearful and Frequent crying. Endocrine Not Present- Cold Intolerance, Excessive Hunger, Hair Changes, Heat Intolerance, Hot flashes and New Diabetes. Hematology Present- Blood Thinners and Easy Bruising. Not Present- Excessive bleeding, Gland problems, HIV and Persistent  Infections.  Vitals (Stephen Gentry RMA; 04/16/2018 9:57 AM) 04/16/2018 9:56 AM Weight: 163.6 lb Height: 73in Body Surface Area: 1.98 m Body Mass Index: 21.58 kg/m  Temp.: 98.68F  Pulse: 70 (Regular)  BP: 128/84 (Sitting, Left Arm, Standard)  Physical Exam (Stephen Dry A. Stephen Ivan MD; 04/16/2018 10:24 AM) General Mental Status-Alert. General Appearance-Consistent with stated age. Hydration-Well hydrated. Voice-Normal.  Head and Neck Head-normocephalic, atraumatic with no lesions or palpable masses. Trachea-midline. Thyroid Gland Characteristics - normal size and consistency.  Eye Eyeball - Bilateral-Extraocular movements intact. Sclera/Conjunctiva - Bilateral-No scleral icterus.  Chest and Lung Exam Chest and lung exam reveals -quiet, even and easy respiratory effort with no use of accessory muscles and on auscultation, normal breath sounds, no adventitious sounds and normal vocal resonance. Inspection Chest Wall - Normal. Back - normal.  Breast - Did not examine.  Cardiovascular Cardiovascular examination reveals -normal heart sounds, regular rate and rhythm with no murmurs and normal pedal pulses bilaterally.  Abdomen Inspection Inspection of the abdomen reveals - No Hernias. Skin - Scar - no surgical scars. Palpation/Percussion Palpation and Percussion of the abdomen reveal - Soft, Non Tender, No Rebound tenderness, No Rigidity (guarding) and No hepatosplenomegaly. Auscultation Auscultation of the abdomen reveals - Bowel sounds normal.  Neurologic - Did not examine.  Musculoskeletal - Did not examine.  Lymphatic Head & Neck  General Head & Neck Lymphatics: Bilateral - Description - Normal. Axillary  General Axillary Region: Bilateral - Description - Normal. Tenderness - Non Tender. Femoral & Inguinal  Generalized Femoral & Inguinal Lymphatics: Bilateral - Description - Normal. Tenderness - Non Tender.    Assessment &  Plan (Stephen Gentry A. Stephen Ivan MD; 04/16/2018 10:25 AM) SIGMOID VOLVULUS (K56.2) Impression: This is a patient with probable sigmoid volvulus. I have discussed this with him and his wife in detail. I would recommend a laparoscopic assisted partial colectomy. He would need to come off his anticoagulation preoperatively. I discussed the surgical procedure in detail and gave him literature regarding surgery. I discussed the risk of surgery which include but not limited to bleeding, infection, injury to surrounding structures, anastomotic leak, cardiopulmonary issues, postoperative recovery, etc. They understand and wish to proceed with surgery which will be scheduled urgently

## 2018-04-26 ENCOUNTER — Other Ambulatory Visit: Payer: Self-pay

## 2018-04-26 ENCOUNTER — Inpatient Hospital Stay (HOSPITAL_COMMUNITY): Payer: Medicare Other | Admitting: Certified Registered"

## 2018-04-26 ENCOUNTER — Inpatient Hospital Stay (HOSPITAL_COMMUNITY): Payer: Medicare Other | Admitting: Physician Assistant

## 2018-04-26 ENCOUNTER — Inpatient Hospital Stay (HOSPITAL_COMMUNITY)
Admission: RE | Admit: 2018-04-26 | Discharge: 2018-04-29 | DRG: 330 | Disposition: A | Discharge status: Home / Self Care | Payer: Medicare Other | Attending: Surgery | Admitting: Surgery

## 2018-04-26 ENCOUNTER — Encounter (HOSPITAL_COMMUNITY)
Admission: RE | Disposition: A | Discharge status: Home / Self Care | Payer: Self-pay | Source: Home / Self Care | Attending: Surgery

## 2018-04-26 ENCOUNTER — Encounter (HOSPITAL_COMMUNITY): Payer: Self-pay | Admitting: *Deleted

## 2018-04-26 DIAGNOSIS — Z5331 Laparoscopic surgical procedure converted to open procedure: Secondary | ICD-10-CM

## 2018-04-26 DIAGNOSIS — Z87891 Personal history of nicotine dependence: Secondary | ICD-10-CM

## 2018-04-26 DIAGNOSIS — I080 Rheumatic disorders of both mitral and aortic valves: Secondary | ICD-10-CM | POA: Diagnosis present

## 2018-04-26 DIAGNOSIS — Q438 Other specified congenital malformations of intestine: Secondary | ICD-10-CM

## 2018-04-26 DIAGNOSIS — I10 Essential (primary) hypertension: Secondary | ICD-10-CM | POA: Diagnosis present

## 2018-04-26 DIAGNOSIS — G20 Parkinson's disease: Secondary | ICD-10-CM | POA: Diagnosis present

## 2018-04-26 DIAGNOSIS — Z7901 Long term (current) use of anticoagulants: Secondary | ICD-10-CM

## 2018-04-26 DIAGNOSIS — Z888 Allergy status to other drugs, medicaments and biological substances status: Secondary | ICD-10-CM | POA: Diagnosis not present

## 2018-04-26 DIAGNOSIS — Z79899 Other long term (current) drug therapy: Secondary | ICD-10-CM

## 2018-04-26 DIAGNOSIS — K562 Volvulus: Principal | ICD-10-CM | POA: Diagnosis present

## 2018-04-26 HISTORY — PX: LAPAROSCOPIC PARTIAL COLECTOMY: SHX5907

## 2018-04-26 SURGERY — LAPAROSCOPIC PARTIAL COLECTOMY
Anesthesia: General | Site: Abdomen

## 2018-04-26 MED ORDER — LINACLOTIDE 145 MCG PO CAPS: 290.0000 ug | ORAL_CAPSULE | Freq: Every day | ORAL | Status: DC

## 2018-04-26 MED ORDER — DIPHENHYDRAMINE HCL 12.5 MG/5ML PO ELIX: 12.5000 mg | ORAL_SOLUTION | Freq: Four times a day (QID) | ORAL | Status: DC | PRN

## 2018-04-26 MED ORDER — FENTANYL CITRATE (PF) 100 MCG/2ML IJ SOLN
INTRAMUSCULAR | Status: AC
  Filled 2018-04-26: qty 2

## 2018-04-26 MED ORDER — EPHEDRINE SULFATE-NACL 50-0.9 MG/10ML-% IV SOSY
PREFILLED_SYRINGE | INTRAVENOUS | Status: DC | PRN
  Administered 2018-04-26 (×4): 5 mg via INTRAVENOUS

## 2018-04-26 MED ORDER — ONDANSETRON HCL 4 MG/2ML IJ SOLN: 4.0000 mg | Freq: Once | INTRAMUSCULAR | Status: DC | PRN

## 2018-04-26 MED ORDER — OXYCODONE HCL 5 MG PO TABS
5.0000 mg | ORAL_TABLET | ORAL | Status: DC | PRN
  Administered 2018-04-27 (×2): 5 mg via ORAL
  Filled 2018-04-26 (×2): qty 1

## 2018-04-26 MED ORDER — SODIUM CHLORIDE 0.9 % IV SOLN
2.0000 g | INTRAVENOUS | Status: AC
  Administered 2018-04-26: 2 g via INTRAVENOUS
  Filled 2018-04-26: qty 2

## 2018-04-26 MED ORDER — OXYCODONE HCL 5 MG/5ML PO SOLN: 5.0000 mg | Freq: Once | ORAL | Status: DC | PRN

## 2018-04-26 MED ORDER — DIPHENHYDRAMINE HCL 50 MG/ML IJ SOLN: 12.5000 mg | Freq: Four times a day (QID) | INTRAMUSCULAR | Status: DC | PRN

## 2018-04-26 MED ORDER — LIDOCAINE 2% (20 MG/ML) 5 ML SYRINGE
INTRAMUSCULAR | Status: AC
  Filled 2018-04-26: qty 5

## 2018-04-26 MED ORDER — ACETAMINOPHEN 160 MG/5ML PO SOLN: 325.0000 mg | ORAL | Status: DC | PRN

## 2018-04-26 MED ORDER — ONDANSETRON HCL 4 MG/2ML IJ SOLN: 4.0000 mg | Freq: Four times a day (QID) | INTRAMUSCULAR | Status: DC | PRN

## 2018-04-26 MED ORDER — ACETAMINOPHEN 325 MG PO TABS: 325.0000 mg | ORAL_TABLET | ORAL | Status: DC | PRN

## 2018-04-26 MED ORDER — MEPERIDINE HCL 50 MG/ML IJ SOLN: 6.2500 mg | INTRAMUSCULAR | Status: DC | PRN

## 2018-04-26 MED ORDER — ONDANSETRON HCL 4 MG PO TABS: 4.0000 mg | ORAL_TABLET | Freq: Four times a day (QID) | ORAL | Status: DC | PRN

## 2018-04-26 MED ORDER — BUPIVACAINE HCL 0.25 % IJ SOLN
INTRAMUSCULAR | Status: DC | PRN
  Administered 2018-04-26: 30 mL

## 2018-04-26 MED ORDER — FENTANYL CITRATE (PF) 100 MCG/2ML IJ SOLN
25.0000 ug | INTRAMUSCULAR | Status: DC | PRN
  Administered 2018-04-26 (×3): 50 ug via INTRAVENOUS

## 2018-04-26 MED ORDER — LIDOCAINE 2% (20 MG/ML) 5 ML SYRINGE
INTRAMUSCULAR | Status: DC | PRN
  Administered 2018-04-26: 80 mg via INTRAVENOUS
  Administered 2018-04-26: 70 mg via INTRAVENOUS

## 2018-04-26 MED ORDER — TRAMADOL HCL 50 MG PO TABS
50.0000 mg | ORAL_TABLET | Freq: Four times a day (QID) | ORAL | Status: DC | PRN
  Administered 2018-04-26: 50 mg via ORAL
  Filled 2018-04-26: qty 1

## 2018-04-26 MED ORDER — GABAPENTIN 300 MG PO CAPS
300.0000 mg | ORAL_CAPSULE | ORAL | Status: AC
  Administered 2018-04-26: 300 mg via ORAL
  Filled 2018-04-26: qty 1

## 2018-04-26 MED ORDER — FENTANYL CITRATE (PF) 250 MCG/5ML IJ SOLN
INTRAMUSCULAR | Status: AC
  Filled 2018-04-26: qty 5

## 2018-04-26 MED ORDER — POTASSIUM CHLORIDE IN NACL 20-0.9 MEQ/L-% IV SOLN
INTRAVENOUS | Status: DC
  Administered 2018-04-26 – 2018-04-28 (×6): via INTRAVENOUS
  Filled 2018-04-26 (×5): qty 1000

## 2018-04-26 MED ORDER — ONDANSETRON HCL 4 MG/2ML IJ SOLN
INTRAMUSCULAR | Status: AC
  Filled 2018-04-26: qty 2

## 2018-04-26 MED ORDER — PROPOFOL 10 MG/ML IV BOLUS
INTRAVENOUS | Status: AC
  Filled 2018-04-26: qty 40

## 2018-04-26 MED ORDER — GABAPENTIN 300 MG PO CAPS
300.0000 mg | ORAL_CAPSULE | Freq: Two times a day (BID) | ORAL | Status: DC
  Administered 2018-04-26 – 2018-04-29 (×6): 300 mg via ORAL
  Filled 2018-04-26 (×6): qty 1

## 2018-04-26 MED ORDER — ENSURE SURGERY PO LIQD
237.0000 mL | Freq: Two times a day (BID) | ORAL | Status: DC
  Administered 2018-04-27 – 2018-04-28 (×2): 237 mL via ORAL
  Filled 2018-04-26 (×7): qty 237

## 2018-04-26 MED ORDER — SUGAMMADEX SODIUM 200 MG/2ML IV SOLN
INTRAVENOUS | Status: AC
  Filled 2018-04-26: qty 2

## 2018-04-26 MED ORDER — ALVIMOPAN 12 MG PO CAPS
12.0000 mg | ORAL_CAPSULE | Freq: Two times a day (BID) | ORAL | Status: DC
  Administered 2018-04-27 – 2018-04-29 (×5): 12 mg via ORAL
  Filled 2018-04-26 (×5): qty 1

## 2018-04-26 MED ORDER — ALVIMOPAN 12 MG PO CAPS
12.0000 mg | ORAL_CAPSULE | ORAL | Status: AC
  Administered 2018-04-26: 12 mg via ORAL
  Filled 2018-04-26: qty 1

## 2018-04-26 MED ORDER — ONDANSETRON HCL 4 MG/2ML IJ SOLN
INTRAMUSCULAR | Status: DC | PRN
  Administered 2018-04-26: 4 mg via INTRAVENOUS

## 2018-04-26 MED ORDER — ENOXAPARIN SODIUM 40 MG/0.4ML ~~LOC~~ SOLN
40.0000 mg | SUBCUTANEOUS | Status: DC
  Administered 2018-04-27: 40 mg via SUBCUTANEOUS
  Filled 2018-04-26: qty 0.4

## 2018-04-26 MED ORDER — DEXAMETHASONE SODIUM PHOSPHATE 10 MG/ML IJ SOLN
INTRAMUSCULAR | Status: AC
  Filled 2018-04-26: qty 1

## 2018-04-26 MED ORDER — FENTANYL CITRATE (PF) 250 MCG/5ML IJ SOLN
INTRAMUSCULAR | Status: DC | PRN
  Administered 2018-04-26 (×2): 25 ug via INTRAVENOUS

## 2018-04-26 MED ORDER — SUGAMMADEX SODIUM 200 MG/2ML IV SOLN
INTRAVENOUS | Status: DC | PRN
  Administered 2018-04-26: 160 mg via INTRAVENOUS

## 2018-04-26 MED ORDER — KETOROLAC TROMETHAMINE 15 MG/ML IJ SOLN
15.0000 mg | Freq: Four times a day (QID) | INTRAMUSCULAR | Status: AC | PRN
  Administered 2018-04-26 – 2018-04-28 (×3): 15 mg via INTRAVENOUS
  Filled 2018-04-26 (×3): qty 1

## 2018-04-26 MED ORDER — ACETAMINOPHEN 500 MG PO TABS
1000.0000 mg | ORAL_TABLET | ORAL | Status: AC
  Administered 2018-04-26: 1000 mg via ORAL
  Filled 2018-04-26: qty 2

## 2018-04-26 MED ORDER — 0.9 % SODIUM CHLORIDE (POUR BTL) OPTIME
TOPICAL | Status: DC | PRN
  Administered 2018-04-26: 2000 mL

## 2018-04-26 MED ORDER — OXYCODONE HCL 5 MG PO TABS: 5.0000 mg | ORAL_TABLET | Freq: Once | ORAL | Status: DC | PRN

## 2018-04-26 MED ORDER — EPHEDRINE 5 MG/ML INJ
INTRAVENOUS | Status: AC
  Filled 2018-04-26: qty 10

## 2018-04-26 MED ORDER — MORPHINE SULFATE (PF) 4 MG/ML IV SOLN
INTRAVENOUS | Status: AC
  Filled 2018-04-26: qty 1

## 2018-04-26 MED ORDER — ESCITALOPRAM OXALATE 20 MG PO TABS
20.0000 mg | ORAL_TABLET | Freq: Every day | ORAL | Status: DC
  Administered 2018-04-26 – 2018-04-28 (×3): 20 mg via ORAL
  Filled 2018-04-26 (×3): qty 1

## 2018-04-26 MED ORDER — BUPIVACAINE HCL (PF) 0.25 % IJ SOLN
INTRAMUSCULAR | Status: AC
  Filled 2018-04-26: qty 30

## 2018-04-26 MED ORDER — MORPHINE SULFATE (PF) 4 MG/ML IV SOLN
1.0000 mg | INTRAVENOUS | Status: DC | PRN
  Administered 2018-04-26: 4 mg via INTRAVENOUS
  Filled 2018-04-26: qty 1

## 2018-04-26 MED ORDER — LACTATED RINGERS IV SOLN
INTRAVENOUS | Status: DC
  Administered 2018-04-26: 06:00:00 via INTRAVENOUS

## 2018-04-26 MED ORDER — POTASSIUM CHLORIDE IN NACL 20-0.9 MEQ/L-% IV SOLN
INTRAVENOUS | Status: AC
  Filled 2018-04-26: qty 1000

## 2018-04-26 MED ORDER — LIDOCAINE 2% (20 MG/ML) 5 ML SYRINGE
INTRAMUSCULAR | Status: DC | PRN
  Administered 2018-04-26: 1.5 mg/kg/h via INTRAVENOUS

## 2018-04-26 MED ORDER — DEXAMETHASONE SODIUM PHOSPHATE 10 MG/ML IJ SOLN
INTRAMUSCULAR | Status: DC | PRN
  Administered 2018-04-26: 8 mg via INTRAVENOUS

## 2018-04-26 MED ORDER — ROCURONIUM BROMIDE 100 MG/10ML IV SOLN
INTRAVENOUS | Status: AC
  Filled 2018-04-26: qty 1

## 2018-04-26 MED ORDER — ROCURONIUM BROMIDE 10 MG/ML (PF) SYRINGE
PREFILLED_SYRINGE | INTRAVENOUS | Status: DC | PRN
  Administered 2018-04-26: 10 mg via INTRAVENOUS
  Administered 2018-04-26: 60 mg via INTRAVENOUS
  Administered 2018-04-26: 10 mg via INTRAVENOUS

## 2018-04-26 SURGICAL SUPPLY — 66 items
APPLIER CLIP 5 13 M/L LIGAMAX5 (MISCELLANEOUS)
APPLIER CLIP ROT 10 11.4 M/L (STAPLE)
BLADE EXTENDED COATED 6.5IN (ELECTRODE) ×3 IMPLANT
CABLE HIGH FREQUENCY MONO STRZ (ELECTRODE) IMPLANT
CELLS DAT CNTRL 66122 CELL SVR (MISCELLANEOUS) ×1 IMPLANT
CLIP APPLIE 5 13 M/L LIGAMAX5 (MISCELLANEOUS) IMPLANT
CLIP APPLIE ROT 10 11.4 M/L (STAPLE) IMPLANT
COVER WAND RF STERILE (DRAPES) IMPLANT
DECANTER SPIKE VIAL GLASS SM (MISCELLANEOUS) ×3 IMPLANT
DERMABOND ADVANCED (GAUZE/BANDAGES/DRESSINGS) ×2
DERMABOND ADVANCED .7 DNX12 (GAUZE/BANDAGES/DRESSINGS) ×1 IMPLANT
DRAIN CHANNEL 19F RND (DRAIN) IMPLANT
DRAPE LAPAROSCOPIC ABDOMINAL (DRAPES) ×3 IMPLANT
DRAPE UTILITY XL STRL (DRAPES) ×6 IMPLANT
DRSG OPSITE POSTOP 3X4 (GAUZE/BANDAGES/DRESSINGS) ×3 IMPLANT
DRSG OPSITE POSTOP 4X10 (GAUZE/BANDAGES/DRESSINGS) IMPLANT
DRSG OPSITE POSTOP 4X6 (GAUZE/BANDAGES/DRESSINGS) IMPLANT
DRSG OPSITE POSTOP 4X8 (GAUZE/BANDAGES/DRESSINGS) IMPLANT
ELECT PENCIL ROCKER SW 15FT (MISCELLANEOUS) ×6 IMPLANT
ELECT REM PT RETURN 15FT ADLT (MISCELLANEOUS) ×3 IMPLANT
EVACUATOR DRAINAGE 10X20 100CC (DRAIN) IMPLANT
EVACUATOR SILICONE 100CC (DRAIN)
GAUZE SPONGE 4X4 12PLY STRL (GAUZE/BANDAGES/DRESSINGS) IMPLANT
GLOVE SURG SIGNA 7.5 PF LTX (GLOVE) ×6 IMPLANT
GOWN STRL REUS W/TWL XL LVL3 (GOWN DISPOSABLE) ×18 IMPLANT
HANDLE SUCTION POOLE (INSTRUMENTS) ×1 IMPLANT
LEGGING LITHOTOMY PAIR STRL (DRAPES) ×3 IMPLANT
LIGASURE IMPACT 36 18CM CVD LR (INSTRUMENTS) ×3 IMPLANT
PACK COLON (CUSTOM PROCEDURE TRAY) ×3 IMPLANT
PAD POSITIONING PINK XL (MISCELLANEOUS) ×3 IMPLANT
PROTECTOR NERVE ULNAR (MISCELLANEOUS) IMPLANT
RELOAD PROXIMATE 75MM BLUE (ENDOMECHANICALS) ×6 IMPLANT
RTRCTR WOUND ALEXIS 18CM MED (MISCELLANEOUS) ×3
SCISSORS LAP 5X35 DISP (ENDOMECHANICALS) ×3 IMPLANT
SET IRRIG TUBING LAPAROSCOPIC (IRRIGATION / IRRIGATOR) ×3 IMPLANT
SET TUBE SMOKE EVAC HIGH FLOW (TUBING) ×3 IMPLANT
SHEARS HARMONIC ACE PLUS 36CM (ENDOMECHANICALS) IMPLANT
SLEEVE XCEL OPT CAN 5 100 (ENDOMECHANICALS) ×6 IMPLANT
SPONGE LAP 18X18 RF (DISPOSABLE) IMPLANT
STAPLER GUN LINEAR PROX 60 (STAPLE) ×3 IMPLANT
STAPLER PROXIMATE 75MM BLUE (STAPLE) ×12 IMPLANT
STAPLER VISISTAT 35W (STAPLE) ×3 IMPLANT
SUCTION POOLE HANDLE (INSTRUMENTS) ×3
SURGILUBE 2OZ TUBE FLIPTOP (MISCELLANEOUS) IMPLANT
SUT ETHILON 2 0 PS N (SUTURE) IMPLANT
SUT PDS AB 1 CTX 36 (SUTURE) IMPLANT
SUT PDS AB 1 TP1 96 (SUTURE) IMPLANT
SUT PROLENE 2 0 KS (SUTURE) IMPLANT
SUT SILK 2 0 (SUTURE)
SUT SILK 2 0 SH CR/8 (SUTURE) ×3 IMPLANT
SUT SILK 2-0 18XBRD TIE 12 (SUTURE) IMPLANT
SUT SILK 3 0 (SUTURE)
SUT SILK 3 0 SH CR/8 (SUTURE) ×6 IMPLANT
SUT SILK 3-0 18XBRD TIE 12 (SUTURE) IMPLANT
SUT VIC AB 2-0 SH 18 (SUTURE) IMPLANT
SUT VICRYL 2 0 18  UND BR (SUTURE)
SUT VICRYL 2 0 18 UND BR (SUTURE) IMPLANT
SYS LAPSCP GELPORT 120MM (MISCELLANEOUS)
SYSTEM LAPSCP GELPORT 120MM (MISCELLANEOUS) IMPLANT
TAPE CLOTH 4X10 WHT NS (GAUZE/BANDAGES/DRESSINGS) IMPLANT
TOWEL OR NON WOVEN STRL DISP B (DISPOSABLE) ×6 IMPLANT
TRAY FOLEY MTR SLVR 16FR STAT (SET/KITS/TRAYS/PACK) ×3 IMPLANT
TROCAR XCEL BLUNT TIP 100MML (ENDOMECHANICALS) IMPLANT
TROCAR XCEL NON-BLD 11X100MML (ENDOMECHANICALS) IMPLANT
TUBING CONNECTING 10 (TUBING) ×4 IMPLANT
TUBING CONNECTING 10' (TUBING) ×2

## 2018-04-26 NOTE — Interval H&P Note (Signed)
History and Physical Interval Note: no change in H and P  04/26/2018 6:56 AM  Stephen Gentry  has presented today for surgery, with the diagnosis of SIGMOID VOLVULVUS  The various methods of treatment have been discussed with the patient and family. After consideration of risks, benefits and other options for treatment, the patient has consented to  Procedure(s): LAPAROSCOPIC ASSISTED PARTIAL COLECTOMY (N/A) as a surgical intervention .  The patient's history has been reviewed, patient examined, no change in status, stable for surgery.  I have reviewed the patient's chart and labs.  Questions were answered to the patient's satisfaction.     Abigail Miyamoto

## 2018-04-26 NOTE — Anesthesia Procedure Notes (Signed)
Procedure Name: Intubation Date/Time: 04/26/2018 7:44 AM Performed by: Eben Burow, CRNA Pre-anesthesia Checklist: Patient identified, Emergency Drugs available, Suction available, Patient being monitored and Timeout performed Patient Re-evaluated:Patient Re-evaluated prior to induction Oxygen Delivery Method: Circle system utilized Preoxygenation: Pre-oxygenation with 100% oxygen Induction Type: IV induction Ventilation: Mask ventilation without difficulty and Oral airway inserted - appropriate to patient size Laryngoscope Size: Mac and 4 Grade View: Grade II Tube type: Oral Tube size: 7.5 mm Number of attempts: 1 Airway Equipment and Method: Stylet Placement Confirmation: ETT inserted through vocal cords under direct vision,  positive ETCO2 and breath sounds checked- equal and bilateral Secured at: 23 cm Tube secured with: Tape Dental Injury: Teeth and Oropharynx as per pre-operative assessment

## 2018-04-26 NOTE — Transfer of Care (Signed)
Immediate Anesthesia Transfer of Care Note  Patient: Stephen Gentry  Procedure(s) Performed: LAPAROSCOPIC ASSISTED PARTIAL COLECTOMY sigmoid (N/A Abdomen)  Patient Location: PACU  Anesthesia Type:General  Level of Consciousness: awake, drowsy and patient cooperative  Airway & Oxygen Therapy: Patient Spontanous Breathing and Patient connected to face mask oxygen  Post-op Assessment: Report given to RN and Post -op Vital signs reviewed and stable  Post vital signs: Reviewed and stable  Last Vitals:  Vitals Value Taken Time  BP    Temp    Pulse    Resp    SpO2      Last Pain:  Vitals:   04/26/18 0535  TempSrc: Oral         Complications: No apparent anesthesia complications

## 2018-04-26 NOTE — Progress Notes (Signed)
Not able to transfer pt to room at this time, per 5W.

## 2018-04-26 NOTE — Anesthesia Postprocedure Evaluation (Signed)
Anesthesia Post Note  Patient: Stephen Gentry  Procedure(s) Performed: LAPAROSCOPIC ASSISTED PARTIAL COLECTOMY sigmoid (N/A Abdomen)     Patient location during evaluation: PACU Anesthesia Type: General Level of consciousness: awake and alert Pain management: pain level controlled Vital Signs Assessment: post-procedure vital signs reviewed and stable Respiratory status: spontaneous breathing, nonlabored ventilation, respiratory function stable and patient connected to nasal cannula oxygen Cardiovascular status: blood pressure returned to baseline and stable Postop Assessment: no apparent nausea or vomiting Anesthetic complications: no    Last Vitals:  Vitals:   04/26/18 0942 04/26/18 0945  BP: (!) 155/78 (!) 148/79  Pulse:  65  Resp: 18 14  Temp: (!) 36.4 C   SpO2: 100%     Last Pain:  Vitals:   04/26/18 0942  TempSrc:   PainSc: Asleep                 Clerence Gubser

## 2018-04-26 NOTE — Op Note (Signed)
LAPAROSCOPIC ASSISTED PARTIAL COLECTOMY sigmoid  Procedure Note  Stephen Gentry 04/26/2018   Pre-op Diagnosis: SIGMOID VOLVULVUS     Post-op Diagnosis: same  Procedure(s): LAPAROSCOPIC ASSISTED PARTIAL COLECTOMY sigmoid  Surgeon(s): Abigail Miyamoto, MD Hedda Slade, PA-C  Anesthesia: General  Staff:  Circulator: Priscille Loveless, RN Scrub Person: Raoul Pitch RN First Assistant: Kem Parkinson, RN  Estimated Blood Loss: Minimal               Specimens: sent to path  Indications: This is an 84 year old gentleman who presented initially with a sigmoid volvulus.  Volvulus spontaneously reduced.  He was then seen by gastroenterology who performed a colonoscopy confirming the redundant colon.  Because of his symptoms, the decision was made to proceed with a partial colectomy.  Findings: The patient had a moderately large redundant loop of sigmoid colon.  Procedure: The patient was brought to the operating room and identified as correct patient.  He was placed upon the operating table and general anesthesia was induced.  His abdomen is prepped and draped in the usual sterile fashion after he was placed in the lithotomy position.  I then made a small vertical incision just below the umbilicus with a scalpel.  I carried this down to the fascia which was then opened the scalpel as well.  Hemostat was then used to pass into the peritoneal cavity under direct vision.  A 0 Vicryl pursestring suture was placed around the fascial opening.  The [facility] port was placed the opening and insufflation of the abdomen was begun.  I then placed a 5 mm trocar in the patient's right lower quadrant under direct vision.  I evaluated the colon.  The patient had a large redundant loop of sigmoid colon.  It was tethered right on the pelvic sidewall and then looped significantly before going down to the pelvis.  No mobilization was required.  At this point the decision was made to convert to an open procedure.   I made a transverse incision in the patient's left lower quadrant with a scalpel.  I took this down through the fascia with the cautery.  I then separated the underlying muscle and opened the peritoneum.  I then was able to identify the sigmoid colon and pulled up through the incision.  Again, there was a large redundant loop.  I transected the colon proximally with a GIA-75 stapler and then distally near the rectum with a GIA-75 stapler as well.  I then took down the mesentery with the LigaSure.  I then reapproximated the 2 ends of colon in a side-to-side fashion with a silk suture.  I performed 2 colotomies and then created a side-to-side anastomosis with a GIA-75 stapler.  The opening was then closed with a TA 60 stapler.  I have reinforced the staple line with interrupted silk sutures.  The anastomosis appeared pink and well perfused and patent.  We then irrigated the abdomen with normal saline.  Hemostasis appeared to be achieved.  The wound protector was then removed.  We changed gowns and gloves as well as sterile drapes.  I then closed the incision in 2 layers with interrupted and figure-of-eight #1 Novafil sutures.  The fascia was anesthetized along with the other incisions with Marcaine.  All incisions were then closed with 3-0 Vicryl and 4-0 Monocryl sutures.  Dermabond was then applied.  The patient tolerated the procedure well.  All the counts were correct at the end of the procedure.  The patient was then  extubated in the operating room and taken in a stable condition to the recovery room.          Abigail Miyamoto   Date: 04/26/2018  Time: 9:16 AM

## 2018-04-27 ENCOUNTER — Encounter (HOSPITAL_COMMUNITY): Payer: Self-pay | Admitting: Surgery

## 2018-04-27 LAB — CBC
HCT: 39.8 % (ref 39.0–52.0)
HEMOGLOBIN: 12.9 g/dL — AB (ref 13.0–17.0)
MCH: 30.2 pg (ref 26.0–34.0)
MCHC: 32.4 g/dL (ref 30.0–36.0)
MCV: 93.2 fL (ref 80.0–100.0)
Platelets: 146 10*3/uL — ABNORMAL LOW (ref 150–400)
RBC: 4.27 MIL/uL (ref 4.22–5.81)
RDW: 13.3 % (ref 11.5–15.5)
WBC: 7.9 10*3/uL (ref 4.0–10.5)
nRBC: 0 % (ref 0.0–0.2)

## 2018-04-27 LAB — BASIC METABOLIC PANEL
Anion gap: 6 (ref 5–15)
BUN: 18 mg/dL (ref 8–23)
CO2: 28 mmol/L (ref 22–32)
Calcium: 9.3 mg/dL (ref 8.9–10.3)
Chloride: 102 mmol/L (ref 98–111)
Creatinine, Ser: 0.9 mg/dL (ref 0.61–1.24)
GFR calc Af Amer: >60 mL/min (ref >60)
Glucose, Bld: 105 mg/dL — ABNORMAL HIGH (ref 70–99)
POTASSIUM: 3.9 mmol/L (ref 3.5–5.1)
Sodium: 136 mmol/L (ref 135–145)

## 2018-04-27 MED ORDER — MIDODRINE HCL 5 MG PO TABS
10.0000 mg | ORAL_TABLET | Freq: Three times a day (TID) | ORAL | Status: DC
  Administered 2018-04-27 – 2018-04-28 (×4): 10 mg via ORAL
  Administered 2018-04-28: 5 mg via ORAL
  Filled 2018-04-27 (×7): qty 2

## 2018-04-27 MED ORDER — CARBIDOPA-LEVODOPA ER 50-200 MG PO TBCR
2.0000 | EXTENDED_RELEASE_TABLET | Freq: Two times a day (BID) | ORAL | Status: DC
  Administered 2018-04-27 – 2018-04-29 (×5): 2 via ORAL
  Filled 2018-04-27 (×5): qty 2

## 2018-04-27 NOTE — Progress Notes (Signed)
1 Day Post-Op   Subjective/Chief Complaint: Pt complains of some gas pain Denies nausea Feeling the urge to have a BM   Objective: Vital signs in last 24 hours: Temp:  [96.4 F (35.8 C)-98 F (36.7 C)] 97.6 F (36.4 C) (02/18 0503) Pulse Rate:  [65-75] 71 (02/18 0503) Resp:  [14-20] 20 (02/18 0503) BP: (103-155)/(60-81) 120/62 (02/18 0503) SpO2:  [90 %-100 %] 99 % (02/18 0503) Weight:  [75.3 kg] 75.3 kg (02/18 0503) Last BM Date: 04/26/18  Intake/Output from previous day: 02/17 0701 - 02/18 0700 In: 3359.9 [P.O.:540; I.V.:2819.9] Out: 625 [Urine:600; Blood:25] Intake/Output this shift: No intake/output data recorded.  Exam: Looks ok Abdomen soft, non-distended  Lab Results:  Recent Labs    04/27/18 0424  WBC 7.9  HGB 12.9*  HCT 39.8  PLT 146*   BMET Recent Labs    04/27/18 0424  NA 136  K 3.9  CL 102  CO2 28  GLUCOSE 105*  BUN 18  CREATININE 0.90  CALCIUM 9.3   PT/INR No results for input(s): LABPROT, INR in the last 72 hours. ABG No results for input(s): PHART, HCO3 in the last 72 hours.  Invalid input(s): PCO2, PO2  Studies/Results: No results found.  Anti-infectives: Anti-infectives (From admission, onward)   Start     Dose/Rate Route Frequency Ordered Stop   04/26/18 0600  cefoTEtan (CEFOTAN) 2 g in sodium chloride 0.9 % 100 mL IVPB     2 g 200 mL/hr over 30 Minutes Intravenous On call to O.R. 04/26/18 7169 04/26/18 0820      Assessment/Plan: s/p Procedure(s): LAPAROSCOPIC ASSISTED PARTIAL COLECTOMY sigmoid (N/A)  Continue to advance activity and diet Hold anticoag meds until tomorrow Resume other meds  LOS: 1 day    Abigail Miyamoto 04/27/2018

## 2018-04-28 LAB — CBC
HCT: 42.7 % (ref 39.0–52.0)
Hemoglobin: 13.5 g/dL (ref 13.0–17.0)
MCH: 30.8 pg (ref 26.0–34.0)
MCHC: 31.6 g/dL (ref 30.0–36.0)
MCV: 97.3 fL (ref 80.0–100.0)
Platelets: 136 10*3/uL — ABNORMAL LOW (ref 150–400)
RBC: 4.39 MIL/uL (ref 4.22–5.81)
RDW: 13.2 % (ref 11.5–15.5)
WBC: 7.4 10*3/uL (ref 4.0–10.5)
nRBC: 0 % (ref 0.0–0.2)

## 2018-04-28 MED ORDER — MIDODRINE HCL 5 MG PO TABS
5.0000 mg | ORAL_TABLET | Freq: Three times a day (TID) | ORAL | Status: DC
  Administered 2018-04-28 – 2018-04-29 (×2): 5 mg via ORAL
  Filled 2018-04-28 (×3): qty 1

## 2018-04-28 MED ORDER — APIXABAN 5 MG PO TABS
5.0000 mg | ORAL_TABLET | Freq: Two times a day (BID) | ORAL | Status: DC
  Administered 2018-04-28 – 2018-04-29 (×3): 5 mg via ORAL
  Filled 2018-04-28 (×3): qty 1

## 2018-04-28 NOTE — Progress Notes (Signed)
2 Days Post-Op   Subjective/Chief Complaint: Had some issues with his dementia and sundowning Passing flatus   Objective: Vital signs in last 24 hours: Temp:  [97.6 F (36.4 C)-98.2 F (36.8 C)] 97.6 F (36.4 C) (02/19 0653) Pulse Rate:  [66-73] 69 (02/19 0653) Resp:  [14-18] 18 (02/19 0653) BP: (111-135)/(62-72) 135/72 (02/19 0653) SpO2:  [100 %] 100 % (02/19 0653) Last BM Date: 04/26/18  Intake/Output from previous day: 02/18 0701 - 02/19 0700 In: 2550.5 [P.O.:1080; I.V.:1470.5] Out: 950 [Urine:950] Intake/Output this shift: No intake/output data recorded.  Exam: Abdomen soft, minimally tender, dressing dry Non-distended  Lab Results:  Recent Labs    04/27/18 0424 04/28/18 0432  WBC 7.9 7.4  HGB 12.9* 13.5  HCT 39.8 42.7  PLT 146* 136*   BMET Recent Labs    04/27/18 0424  NA 136  K 3.9  CL 102  CO2 28  GLUCOSE 105*  BUN 18  CREATININE 0.90  CALCIUM 9.3   PT/INR No results for input(s): LABPROT, INR in the last 72 hours. ABG No results for input(s): PHART, HCO3 in the last 72 hours.  Invalid input(s): PCO2, PO2  Studies/Results: No results found.  Anti-infectives: Anti-infectives (From admission, onward)   Start     Dose/Rate Route Frequency Ordered Stop   04/26/18 0600  cefoTEtan (CEFOTAN) 2 g in sodium chloride 0.9 % 100 mL IVPB     2 g 200 mL/hr over 30 Minutes Intravenous On call to O.R. 04/26/18 0528 04/26/18 0820      Assessment/Plan: s/p Procedure(s): LAPAROSCOPIC ASSISTED PARTIAL COLECTOMY sigmoid (N/A)  Advance activity and po Resume anticoag meds  LOS: 2 days    Abigail Miyamoto 04/28/2018

## 2018-04-29 MED ORDER — TRAMADOL HCL 50 MG PO TABS: 50.0000 mg | ORAL_TABLET | Freq: Four times a day (QID) | ORAL | 0 refills | Status: DC | PRN

## 2018-04-29 NOTE — Progress Notes (Addendum)
3 Days Post-Op   Subjective/Chief Complaint: Fairly comfortable Having BM's   Objective: Vital signs in last 24 hours: Temp:  [97.7 F (36.5 C)-98.1 F (36.7 C)] 98.1 F (36.7 C) (02/20 0616) Pulse Rate:  [66-70] 66 (02/20 0626) Resp:  [12-18] 15 (02/20 0616) BP: (133-153)/(72-89) 146/82 (02/20 0626) SpO2:  [95 %-100 %] 100 % (02/20 0616) Weight:  [79.9 kg] 79.9 kg (02/20 0616) Last BM Date: 04/26/18  Intake/Output from previous day: 02/19 0701 - 02/20 0700 In: 2553.9 [P.O.:680; I.V.:1873.9] Out: 2700 [Urine:2700] Intake/Output this shift: No intake/output data recorded.  Exam: Abdomen soft, non-distended, mildly tender  Lab Results:  Recent Labs    04/27/18 0424 04/28/18 0432  WBC 7.9 7.4  HGB 12.9* 13.5  HCT 39.8 42.7  PLT 146* 136*   BMET Recent Labs    04/27/18 0424  NA 136  K 3.9  CL 102  CO2 28  GLUCOSE 105*  BUN 18  CREATININE 0.90  CALCIUM 9.3   PT/INR No results for input(s): LABPROT, INR in the last 72 hours. ABG No results for input(s): PHART, HCO3 in the last 72 hours.  Invalid input(s): PCO2, PO2  Studies/Results: No results found.  Anti-infectives: Anti-infectives (From admission, onward)   Start     Dose/Rate Route Frequency Ordered Stop   04/26/18 0600  cefoTEtan (CEFOTAN) 2 g in sodium chloride 0.9 % 100 mL IVPB     2 g 200 mL/hr over 30 Minutes Intravenous On call to O.R. 04/26/18 [id number] 04/26/18 0820      Assessment/Plan: s/p Procedure(s): LAPAROSCOPIC ASSISTED PARTIAL COLECTOMY sigmoid (N/A)  Continues to improve Discussed with his wife. Discharge today   LOS: 3 days    Abigail Miyamoto 04/29/2018

## 2018-04-29 NOTE — Discharge Instructions (Signed)
[facility]      [facility], [place] [phone]  OPEN ABDOMINAL SURGERY: POST OP INSTRUCTIONS  Always review your discharge instruction sheet given to you by the facility where your surgery was performed.  IF YOU HAVE DISABILITY OR FAMILY LEAVE FORMS, YOU MUST BRING THEM TO THE OFFICE FOR PROCESSING.  PLEASE DO NOT GIVE THEM TO YOUR DOCTOR.  1. A prescription for pain medication may be given to you upon discharge.  Take your pain medication as prescribed, if needed.  If narcotic pain medicine is not needed, then you may take acetaminophen (Tylenol) or ibuprofen (Advil) as needed. 2. Take your usually prescribed medications unless otherwise directed. 3. If you need a refill on your pain medication, please contact your pharmacy. They will contact our office to request authorization.  Prescriptions will not be filled after 5pm or on week-ends. 4. You should follow a light diet the first few days after arrival home, such as soup and crackers, pudding, etc.unless your doctor has advised otherwise. A high-fiber, low fat diet can be resumed as tolerated.   Be sure to include lots of fluids daily. Most patients will experience some swelling and bruising on the chest and neck area.  Ice packs will help.  Swelling and bruising can take several days to resolve 5. Most patients will experience some swelling and bruising in the area of the incision. Ice pack will help. Swelling and bruising can take several days to resolve..  6. It is common to experience some constipation if taking pain medication after surgery.  Increasing fluid intake and taking a stool softener will usually help or prevent this problem from occurring.  A mild laxative (Milk of Magnesia or Miralax) should be taken according to package directions if there are no bowel movements after 48 hours. 7.  You may have steri-strips (small skin tapes) in place directly over the incision.  These strips should be left on the skin for 7-10 days.  If your  surgeon used skin glue on the incision, you may shower in 24 hours.  The glue will flake off over the next 2-3 weeks.  Any sutures or staples will be removed at the office during your follow-up visit. You may find that a light gauze bandage over your incision may keep your staples from being rubbed or pulled. You may shower and replace the bandage daily. 8. ACTIVITIES:  You may resume regular (light) daily activities beginning the next day--such as daily self-care, walking, climbing stairs--gradually increasing activities as tolerated.  You may have sexual intercourse when it is comfortable.  Refrain from any heavy lifting or straining until approved by your doctor. a. You may drive when you no longer are taking prescription pain medication, you can comfortably wear a seatbelt, and you can safely maneuver your car and apply brakes b. Return to Work: ___________________________________ 9. You should see your doctor in the office for a follow-up appointment approximately two weeks after your surgery.  Make sure that you call for this appointment within a day or two after you arrive home to insure a convenient appointment time. OTHER INSTRUCTIONS: OK TO SHOWER TYLENOL AND ICE PACK ALSO FOR PAIN NO LIFTING MORE THAN 15 TO 20 POUNDS FOR 6 WEEKS _____________________________________________________________ _____________________________________________________________  WHEN TO CALL YOUR DOCTOR: 1. Fever over 101.0 2. Inability to urinate 3. Nausea and/or vomiting 4. Extreme swelling or bruising 5. Continued bleeding from incision. 6. Increased pain, redness, or drainage from the incision. 7. Difficulty swallowing or breathing 8. Muscle cramping  or spasms. 9. Numbness or tingling in hands or feet or around lips.  The clinic staff is available to answer your questions during regular business hours.  Please dont hesitate to call and ask to speak to one of the nurses if you have concerns.  For further  questions, please visit [email or web address]

## 2018-04-29 NOTE — Discharge Summary (Signed)
Physician Discharge Summary  Patient ID: Stephen Gentry MRN: [id number] DOB/AGE: 1935/02/18 84 y.o.  Admit date: 04/26/2018 Discharge date: 04/29/2018  Admission Diagnoses:  Discharge Diagnoses:  Active Problems:   Sigmoid volvulus [facility])   Discharged Condition: good  Hospital Course: uneventful post op recovery.  Tolerated surgery, advancement of diet, and pain control.  By POD#3 patient was moving bowels and tolerating po so discharged  Consults: None  Significant Diagnostic Studies:   Treatments: surgery: laparoscopic assisted sigmoid colectomy  Discharge Exam: Blood pressure (!) 146/82, pulse 66, temperature 98.1 F (36.7 C), temperature source Oral, resp. rate 15, weight 79.9 kg, SpO2 100 %. General appearance: alert, cooperative and no distress Resp: clear to auscultation bilaterally Cardio: regular rate and rhythm, S1, S2 normal, no murmur, click, rub or gallop Incision/Wound:  Disposition: Discharge disposition: 01-Home or Self Care        Allergies as of 04/29/2018      Reactions   Lorazepam Other (See Comments)   Combative, foul language. Wife doesn't want patient to have again   Tetracyclines & Related Anaphylaxis   Amitiza [lubiprostone] Nausea Only      Medication List    TAKE these medications   apixaban 5 MG Tabs tablet Commonly known as:  ELIQUIS Take 1 tablet (5 mg total) by mouth 2 (two) times daily.   Carbidopa-Levodopa ER 25-100 MG tablet controlled release Commonly known as:  SINEMET CR 2 tabs tid, 1 at bed, 1 at 2 am What changed:    how much to take  how to take this  when to take this  additional instructions   Cholecalciferol 50 MCG (2000 UT) Caps Take 2,000 Units by mouth daily.   cyanocobalamin 1000 MCG/ML injection Commonly known as:  (VITAMIN B-12) Inject 1,000 mcg into the muscle every 30 (thirty) days.   escitalopram 20 MG tablet Commonly known as:  LEXAPRO TAKE 1 TABLET BY MOUTH EVERY DAY What changed:  when to  take this   FLEET ENEMA RE Place 1 application rectally daily as needed (constipation).   linaclotide 290 MCG Caps capsule Commonly known as:  LINZESS Take 1 tablet by mouth once daily. What changed:    how much to take  how to take this  when to take this  additional instructions   midodrine 10 MG tablet Commonly known as:  PROAMATINE Take 1 tablet (10 mg total) by mouth 3 (three) times daily with meals.   polyethylene glycol packet Commonly known as:  MIRALAX / GLYCOLAX Take 17 g by mouth 2 (two) times daily. During the off week of linzess therapy   simethicone 80 MG chewable tablet Commonly known as:  MYLICON Chew 2 tablets (160 mg total) by mouth 4 (four) times daily as needed for flatulence.   SUPER B COMPLEX PO Take 1 tablet by mouth daily.   traMADol 50 MG tablet Commonly known as:  ULTRAM Take 1 tablet (50 mg total) by mouth every 6 (six) hours as needed for moderate pain or severe pain.      Follow-up Information    Abigail Miyamoto, MD. Schedule an appointment as soon as possible for a visit in 3 week(s).   Specialty:  General Surgery Contact information: [address] [place] [address] [phone]           Signed: Abigail Miyamoto 04/29/2018, 7:51 AM

## 2018-04-30 ENCOUNTER — Other Ambulatory Visit: Payer: Self-pay | Admitting: Neurology

## 2018-05-04 ENCOUNTER — Ambulatory Visit (INDEPENDENT_AMBULATORY_CARE_PROVIDER_SITE_OTHER): Payer: Medicare Other | Admitting: Internal Medicine

## 2018-05-04 ENCOUNTER — Encounter: Payer: Self-pay | Admitting: Internal Medicine

## 2018-05-04 VITALS — BP 98/58 | HR 79 | Ht 73.0 in | Wt 160.8 lb

## 2018-05-04 DIAGNOSIS — I951 Orthostatic hypotension: Secondary | ICD-10-CM | POA: Diagnosis not present

## 2018-05-04 DIAGNOSIS — I480 Paroxysmal atrial fibrillation: Secondary | ICD-10-CM | POA: Diagnosis not present

## 2018-05-04 DIAGNOSIS — G20 Parkinson's disease: Secondary | ICD-10-CM | POA: Diagnosis not present

## 2018-05-04 MED ORDER — FLUDROCORTISONE ACETATE 0.1 MG PO TABS: 0.1000 mg | ORAL_TABLET | Freq: Every day | ORAL | 3 refills | Status: DC
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
## 2018-05-04 NOTE — Patient Instructions (Signed)
Medication Instructions:  Your physician has recommended you make the following change in your medication:   1. Begin Florinef, 0.1mg  tablet, two times per day.   Labwork: Your physician recommends that you return for lab work in:   Please have a BMP drawn in 2 weeks  Testing/Procedures: None ordered.  Follow-Up: Your physician recommends that you schedule a follow-up appointment in:   8 weeks with Dr Graciela Husbands  Any Other Special Instructions Will Be Listed Below (If Applicable).     If you need a refill on your cardiac medications before your next appointment, please call your pharmacy.

## 2018-05-15 ENCOUNTER — Observation Stay (HOSPITAL_COMMUNITY)
Admission: EM | Admit: 2018-05-15 | Discharge: 2018-05-16 | Disposition: A | Discharge status: Home / Self Care | Payer: Medicare Other | Attending: Internal Medicine | Admitting: Internal Medicine

## 2018-05-15 ENCOUNTER — Other Ambulatory Visit: Payer: Self-pay

## 2018-05-15 ENCOUNTER — Telehealth: Payer: Self-pay | Admitting: Physician Assistant

## 2018-05-15 ENCOUNTER — Emergency Department (HOSPITAL_COMMUNITY): Payer: Medicare Other

## 2018-05-15 ENCOUNTER — Encounter (HOSPITAL_COMMUNITY): Payer: Self-pay | Admitting: Emergency Medicine

## 2018-05-15 DIAGNOSIS — Z888 Allergy status to other drugs, medicaments and biological substances status: Secondary | ICD-10-CM | POA: Insufficient documentation

## 2018-05-15 DIAGNOSIS — G4761 Periodic limb movement disorder: Secondary | ICD-10-CM | POA: Diagnosis not present

## 2018-05-15 DIAGNOSIS — G309 Alzheimer's disease, unspecified: Secondary | ICD-10-CM | POA: Diagnosis not present

## 2018-05-15 DIAGNOSIS — K5904 Chronic idiopathic constipation: Secondary | ICD-10-CM | POA: Insufficient documentation

## 2018-05-15 DIAGNOSIS — F0280 Dementia in other diseases classified elsewhere without behavioral disturbance: Secondary | ICD-10-CM | POA: Insufficient documentation

## 2018-05-15 DIAGNOSIS — E569 Vitamin deficiency, unspecified: Secondary | ICD-10-CM | POA: Insufficient documentation

## 2018-05-15 DIAGNOSIS — R634 Abnormal weight loss: Secondary | ICD-10-CM | POA: Insufficient documentation

## 2018-05-15 DIAGNOSIS — R2681 Unsteadiness on feet: Secondary | ICD-10-CM | POA: Diagnosis not present

## 2018-05-15 DIAGNOSIS — R55 Syncope and collapse: Secondary | ICD-10-CM

## 2018-05-15 DIAGNOSIS — Z79899 Other long term (current) drug therapy: Secondary | ICD-10-CM | POA: Insufficient documentation

## 2018-05-15 DIAGNOSIS — I480 Paroxysmal atrial fibrillation: Secondary | ICD-10-CM | POA: Diagnosis not present

## 2018-05-15 DIAGNOSIS — F419 Anxiety disorder, unspecified: Secondary | ICD-10-CM | POA: Insufficient documentation

## 2018-05-15 DIAGNOSIS — K5900 Constipation, unspecified: Secondary | ICD-10-CM | POA: Diagnosis present

## 2018-05-15 DIAGNOSIS — Z87891 Personal history of nicotine dependence: Secondary | ICD-10-CM | POA: Insufficient documentation

## 2018-05-15 DIAGNOSIS — G20 Parkinson's disease: Secondary | ICD-10-CM | POA: Diagnosis not present

## 2018-05-15 DIAGNOSIS — Z7901 Long term (current) use of anticoagulants: Secondary | ICD-10-CM | POA: Insufficient documentation

## 2018-05-15 DIAGNOSIS — I4892 Unspecified atrial flutter: Secondary | ICD-10-CM | POA: Insufficient documentation

## 2018-05-15 DIAGNOSIS — I951 Orthostatic hypotension: Secondary | ICD-10-CM | POA: Diagnosis not present

## 2018-05-15 LAB — CBC WITH DIFFERENTIAL/PLATELET
Abs Immature Granulocytes: 0.02 10*3/uL (ref 0.00–0.07)
Basophils Absolute: 0 10*3/uL (ref 0.0–0.1)
Basophils Relative: 0 %
EOS PCT: 0 %
Eosinophils Absolute: 0 10*3/uL (ref 0.0–0.5)
HCT: 42.6 % (ref 39.0–52.0)
HEMOGLOBIN: 14.1 g/dL (ref 13.0–17.0)
Immature Granulocytes: 0 %
LYMPHS ABS: 0.2 10*3/uL — AB (ref 0.7–4.0)
LYMPHS PCT: 4 %
MCH: 30.3 pg (ref 26.0–34.0)
MCHC: 33.1 g/dL (ref 30.0–36.0)
MCV: 91.6 fL (ref 80.0–100.0)
Monocytes Absolute: 0.2 10*3/uL (ref 0.1–1.0)
Monocytes Relative: 5 %
Neutro Abs: 4.1 10*3/uL (ref 1.7–7.7)
Neutrophils Relative %: 91 %
Platelets: 170 10*3/uL (ref 150–400)
RBC: 4.65 MIL/uL (ref 4.22–5.81)
RDW: 13.1 % (ref 11.5–15.5)
WBC: 4.6 10*3/uL (ref 4.0–10.5)
nRBC: 0 % (ref 0.0–0.2)

## 2018-05-15 LAB — URINALYSIS, ROUTINE W REFLEX MICROSCOPIC
Bilirubin Urine: NEGATIVE
Glucose, UA: NEGATIVE mg/dL
HGB URINE DIPSTICK: NEGATIVE
Ketones, ur: 20 mg/dL — AB
Leukocytes,Ua: NEGATIVE
Nitrite: NEGATIVE
Protein, ur: NEGATIVE mg/dL
Specific Gravity, Urine: 1.024 (ref 1.005–1.030)
pH: 6 (ref 5.0–8.0)

## 2018-05-15 LAB — BASIC METABOLIC PANEL
Anion gap: 9 (ref 5–15)
BUN: 18 mg/dL (ref 8–23)
CO2: 27 mmol/L (ref 22–32)
CREATININE: 0.77 mg/dL (ref 0.61–1.24)
Calcium: 9.2 mg/dL (ref 8.9–10.3)
Chloride: 100 mmol/L (ref 98–111)
GFR calc Af Amer: >60 mL/min (ref >60)
GFR calc non Af Amer: >60 mL/min (ref >60)
Glucose, Bld: 91 mg/dL (ref 70–99)
Potassium: 3.7 mmol/L (ref 3.5–5.1)
Sodium: 136 mmol/L (ref 135–145)

## 2018-05-15 LAB — HEPATIC FUNCTION PANEL
ALT: 6 U/L (ref 0–44)
AST: 19 U/L (ref 15–41)
Albumin: 3.8 g/dL (ref 3.5–5.0)
Alkaline Phosphatase: 54 U/L (ref 38–126)
Bilirubin, Direct: 0.2 mg/dL (ref 0.0–0.2)
Indirect Bilirubin: 1.4 mg/dL — ABNORMAL HIGH (ref 0.3–0.9)
Total Bilirubin: 1.6 mg/dL — ABNORMAL HIGH (ref 0.3–1.2)
Total Protein: 6.3 g/dL — ABNORMAL LOW (ref 6.5–8.1)

## 2018-05-15 LAB — LIPASE, BLOOD: LIPASE: 31 U/L (ref 11–51)

## 2018-05-15 LAB — TROPONIN I: Troponin I: <0.03 ng/mL (ref <0.03)

## 2018-05-15 MED ORDER — FLUDROCORTISONE ACETATE 0.1 MG PO TABS
0.1000 mg | ORAL_TABLET | Freq: Every day | ORAL | Status: DC
  Administered 2018-05-16: 0.1 mg via ORAL
  Filled 2018-05-15: qty 1

## 2018-05-15 MED ORDER — CARBIDOPA-LEVODOPA ER 25-100 MG PO TBCR
1.0000 | EXTENDED_RELEASE_TABLET | Freq: Every day | ORAL | Status: DC
  Administered 2018-05-16: 1 via ORAL
  Filled 2018-05-15: qty 1

## 2018-05-15 MED ORDER — CARBIDOPA-LEVODOPA ER 25-100 MG PO TBCR
1.0000 | EXTENDED_RELEASE_TABLET | Freq: Every day | ORAL | Status: DC
  Administered 2018-05-15: 1 via ORAL
  Filled 2018-05-15: qty 1

## 2018-05-15 MED ORDER — APIXABAN 5 MG PO TABS
5.0000 mg | ORAL_TABLET | Freq: Two times a day (BID) | ORAL | Status: DC
  Administered 2018-05-15 – 2018-05-16 (×2): 5 mg via ORAL
  Filled 2018-05-15 (×2): qty 1

## 2018-05-15 MED ORDER — MIDODRINE HCL 5 MG PO TABS
5.0000 mg | ORAL_TABLET | Freq: Three times a day (TID) | ORAL | Status: DC
  Administered 2018-05-15 – 2018-05-16 (×2): 5 mg via ORAL
  Filled 2018-05-15 (×3): qty 1

## 2018-05-15 MED ORDER — SODIUM CHLORIDE 0.9 % IV BOLUS
1000.0000 mL | Freq: Once | INTRAVENOUS | Status: AC
  Administered 2018-05-15: 1000 mL via INTRAVENOUS

## 2018-05-15 MED ORDER — ESCITALOPRAM OXALATE 10 MG PO TABS: 20.0000 mg | ORAL_TABLET | Freq: Every day | ORAL | Status: DC

## 2018-05-15 MED ORDER — POLYETHYLENE GLYCOL 3350 17 G PO PACK
17.0000 g | PACK | Freq: Two times a day (BID) | ORAL | Status: DC
  Administered 2018-05-15 – 2018-05-16 (×2): 17 g via ORAL
  Filled 2018-05-15 (×2): qty 1

## 2018-05-15 MED ORDER — ESCITALOPRAM OXALATE 20 MG PO TABS
20.0000 mg | ORAL_TABLET | Freq: Every day | ORAL | Status: DC
  Administered 2018-05-15: 20 mg via ORAL
  Filled 2018-05-15: qty 1

## 2018-05-15 MED ORDER — SIMETHICONE 80 MG PO CHEW
160.0000 mg | CHEWABLE_TABLET | Freq: Four times a day (QID) | ORAL | Status: DC | PRN
  Filled 2018-05-15: qty 2

## 2018-05-15 MED ORDER — TRAMADOL HCL 50 MG PO TABS: 50.0000 mg | ORAL_TABLET | Freq: Four times a day (QID) | ORAL | Status: DC | PRN

## 2018-05-15 MED ORDER — LINACLOTIDE 145 MCG PO CAPS
290.0000 ug | ORAL_CAPSULE | Freq: Every day | ORAL | Status: DC
  Administered 2018-05-16: 290 ug via ORAL
  Filled 2018-05-15: qty 2

## 2018-05-15 MED ORDER — VITAMIN D 25 MCG (1000 UNIT) PO TABS
2000.0000 [IU] | ORAL_TABLET | Freq: Every day | ORAL | Status: DC
  Administered 2018-05-16: 2000 [IU] via ORAL
  Filled 2018-05-15: qty 2

## 2018-05-15 MED ORDER — CARBIDOPA-LEVODOPA ER 25-100 MG PO TBCR
2.0000 | EXTENDED_RELEASE_TABLET | Freq: Three times a day (TID) | ORAL | Status: DC
  Administered 2018-05-16 (×2): 2 via ORAL
  Filled 2018-05-15 (×3): qty 2

## 2018-05-15 MED ORDER — SODIUM CHLORIDE 0.9 % IV SOLN
INTRAVENOUS | Status: DC
  Administered 2018-05-15: 17:00:00 via INTRAVENOUS

## 2018-05-15 MED ORDER — TRAZODONE HCL 50 MG PO TABS
50.0000 mg | ORAL_TABLET | Freq: Once | ORAL | Status: AC
  Administered 2018-05-15: 50 mg via ORAL
  Filled 2018-05-15: qty 1

## 2018-05-15 MED ORDER — IOHEXOL 300 MG/ML  SOLN
100.0000 mL | Freq: Once | INTRAMUSCULAR | Status: AC | PRN
  Administered 2018-05-15: 100 mL via INTRAVENOUS
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
## 2018-05-15 NOTE — Telephone Encounter (Signed)
The patient's wife called the answering service after-hours today. Page states patient "feels like he is dying." Fingertips are numb and tingling, has irregular heartbeat, not eating, low blood pressure - BP 78/46 pulse 90. Recently started Florinef, taking once a day. He is unable to comfortably sit up or stand, just feels completely awful. With such persistently low BP there could be a multitude of serious issues going on. Not clear cut that there is a specific cardiac problem but needs to come in ASAP given hypotension and numerous complaints. I advised they call EMS but wife cites that they would prefer to drive by private car instead. Discussed risks which she acknowledges. Wife verbalized understanding and gratitude. Azariyah Luhrs PA-C
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
## 2018-05-15 NOTE — ED Notes (Signed)
Patient transported to CT 

## 2018-05-15 NOTE — ED Triage Notes (Signed)
Pt complains of low BP. Pt also had recent stomach surgery and is having some pain from this past surgery. Pt has orthostatic hypotension. Pt states he feels like he is dying. Pt is complaining of tingling in his hands

## 2018-05-16 DIAGNOSIS — K5900 Constipation, unspecified: Secondary | ICD-10-CM | POA: Diagnosis not present

## 2018-05-16 DIAGNOSIS — Z7901 Long term (current) use of anticoagulants: Secondary | ICD-10-CM

## 2018-05-16 DIAGNOSIS — I4892 Unspecified atrial flutter: Secondary | ICD-10-CM | POA: Diagnosis not present

## 2018-05-16 DIAGNOSIS — G20 Parkinson's disease: Secondary | ICD-10-CM

## 2018-05-16 DIAGNOSIS — I951 Orthostatic hypotension: Secondary | ICD-10-CM | POA: Diagnosis not present

## 2018-05-16 DIAGNOSIS — R634 Abnormal weight loss: Secondary | ICD-10-CM

## 2018-05-16 DIAGNOSIS — G4761 Periodic limb movement disorder: Secondary | ICD-10-CM

## 2018-05-16 LAB — TROPONIN I: Troponin I: <0.03 ng/mL (ref <0.03)

## 2018-05-16 LAB — MAGNESIUM: Magnesium: 2.1 mg/dL (ref 1.7–2.4)

## 2018-05-16 MED ORDER — MIDODRINE HCL 5 MG PO TABS: 10.0000 mg | ORAL_TABLET | Freq: Three times a day (TID) | ORAL | Status: DC

## 2018-05-16 MED ORDER — MIDODRINE HCL 5 MG PO TABS
5.0000 mg | ORAL_TABLET | Freq: Three times a day (TID) | ORAL | Status: DC
  Administered 2018-05-16: 5 mg via ORAL
  Filled 2018-05-16: qty 1

## 2018-05-16 MED ORDER — MIDODRINE HCL 10 MG PO TABS: 5.0000 mg | ORAL_TABLET | Freq: Three times a day (TID) | ORAL | Status: DC

## 2018-05-16 NOTE — Progress Notes (Signed)
NURSING PROGRESS NOTE  Stephen Gentry [id number] Discharge Data: 05/16/2018 3:18 PM Attending Provider: No att. providers found QMG:[id number], Stephen Gentry., MD     Stephen Gentry to be D/C'd Home per MD order.  Discussed with the patient the After Visit Summary and all questions fully answered. All IV's discontinued with no bleeding noted. All belongings returned to patient for patient to take home.   Last Vital Signs:  Blood pressure (!) 115/52, pulse 62, temperature 97.8 F (36.6 C), temperature source Axillary, resp. rate 18, height 6\' 1"  (1.854 m), weight 72.9 kg, SpO2 96 %.  Discharge Medication List Allergies as of 05/16/2018      Reactions   Lorazepam Other (See Comments)   Combative, foul language. Wife doesn't want patient to have again   Tetracyclines & Related Anaphylaxis   Amitiza [lubiprostone] Nausea Only      Medication List    TAKE these medications   apixaban 5 MG Tabs tablet Commonly known as:  ELIQUIS Take 1 tablet (5 mg total) by mouth 2 (two) times daily.   Carbidopa-Levodopa ER 25-100 MG tablet controlled release Commonly known as:  SINEMET CR 2 tabs tid, 1 at bed, 1 at 2 am   Cholecalciferol 50 MCG (2000 UT) Caps Take 2,000 Units by mouth daily.   cyanocobalamin 1000 MCG/ML injection Commonly known as:  (VITAMIN B-12) Inject 1,000 mcg into the muscle every 30 (thirty) days.   escitalopram 20 MG tablet Commonly known as:  LEXAPRO TAKE 1 TABLET BY MOUTH EVERY DAY   FLEET ENEMA RE Place 1 application rectally daily as needed (constipation).   fludrocortisone 0.1 MG tablet Commonly known as:  FLORINEF Take 1 tablet (0.1 mg total) by mouth daily.   linaclotide 290 MCG Caps capsule Commonly known as:  LINZESS Take 1 tablet by mouth once daily.   midodrine 10 MG tablet Commonly known as:  PROAMATINE Take 0.5 tablets (5 mg total) by mouth 3 (three) times daily with meals. What changed:  when to take this   polyethylene glycol packet Commonly known as:   MIRALAX / GLYCOLAX Take 17 g by mouth 2 (two) times daily. During the off week of linzess therapy   simethicone 80 MG chewable tablet Commonly known as:  MYLICON Chew 2 tablets (160 mg total) by mouth 4 (four) times daily as needed for flatulence.   SUPER B COMPLEX PO Take 1 tablet by mouth daily.   traMADol 50 MG tablet Commonly known as:  Ultram Take 1 tablet (50 mg total) by mouth every 6 (six) hours as needed for moderate pain or severe pain.
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
## 2018-05-16 NOTE — Evaluation (Addendum)
Occupational Therapy Evaluation Patient Details Name: Stephen Gentry MRN: [id number] DOB: 1934-07-14 Today's Date: 05/16/2018    History of Present Illness This 84 y.o. male admitted with hypotension and chronic constipation.  PMH includes:   Parkinson's disease with autonomic dysfunction, Alzheimer's disease, orthostatic hypotension, paroxysmal A-Fib on Eliquis   Clinical Impression   Patient evaluated by Occupational Therapy with no further acute OT needs identified. All education has been completed and the patient has no further questions. Pt presents to OT close to baseline.  He did demonstrate increased difficulty with LB ADLs due to feeling a bit more rigid (meds off schedule a little bit), and abdominal pain.  Wife supportive and able to assist as needed at discharge.  Anticipate he will return to baseline fairly quickly.  See below for any follow-up Occupational Therapy or equipment needs. OT is signing off. Thank you for this referral.  See below for orthostatic BPs  With thigh high TEDs on.  Pt requested to remove abdominal binder due to discomfort         Follow Up Recommendations  No OT follow up;Supervision/Assistance - 24 hour(initially )    Equipment Recommendations  None recommended by OT    Recommendations for Other Services       Precautions / Restrictions Precautions Precautions: Fall Precaution Comments: denies h/o falls       Mobility Bed Mobility               General bed mobility comments: Pt sitting EOB   Transfers Overall transfer level: Needs assistance Equipment used: 1 person hand held assist Transfers: Sit to/from [employer] Sit to Stand: Min assist Stand pivot transfers: Min guard       General transfer comment: assist to boost from lower surface     Balance Overall balance assessment: Mild deficits observed, not formally tested             Standing balance comment: Pt able to bend forward to pull pants down  over ankles with no LOB                            ADL either performed or assessed with clinical judgement   ADL Overall ADL's : Needs assistance/impaired Eating/Feeding: Modified independent;Sitting   Grooming: Wash/dry hands;Wash/dry face;Oral care;Brushing hair;Min guard;Standing   Upper Body Bathing: Set up;Sitting   Lower Body Bathing: Min guard;Sit to/from stand   Upper Body Dressing : Set up;Sitting   Lower Body Dressing: Minimal assistance;Sit to/from stand Lower Body Dressing Details (indicate cue type and reason): Pt moves slowly and reports increased rigidity.  Wife reports his medication schedule is off a bit compared to home med schedule - unsure if that may be contributing  Toilet Transfer: Min guard;Ambulation;Comfort height toilet   Toileting- Clothing Manipulation and Hygiene: Min guard;Sit to/from stand       Functional mobility during ADLs: Min guard General ADL Comments: Pt assisted with donning thigh high TED hose      Vision Baseline Vision/History: Wears glasses Wears Glasses: At all times Patient Visual Report: No change from baseline       Perception     Praxis Praxis Praxis tested?: Deficits Deficits: Ideation    Pertinent Vitals/Pain Pain Assessment: Faces Faces Pain Scale: Hurts little more Pain Location: abdomen Pain Descriptors / Indicators: Aching;Pressure Pain Intervention(s): Monitored during session     Hand Dominance Right   Extremity/Trunk Assessment Upper Extremity Assessment Upper Extremity Assessment:  Generalized weakness;RUE deficits/detail;LUE deficits/detail RUE Deficits / Details: tremor noted bil.  LUE Deficits / Details: tremor noted bil.  LUE Coordination: decreased fine motor       Cervical / Trunk Assessment Cervical / Trunk Assessment: Kyphotic(capital extension of head/neck )   Communication Communication Communication: No difficulties   Cognition Arousal/Alertness: Awake/alert Behavior  During Therapy: Flat affect Overall Cognitive Status: History of cognitive impairments - at baseline                                 General Comments: wife prestent and reports he is at baseline    General Comments  Pt and wife instructed in use of abdominal binder, but pt requested to have it removed due to increased abdominal gas pain.  BP seated 115/52; standing 94/59; standing at 3 mins 90/53    Exercises     Shoulder Instructions      Home Living Family/patient expects to be discharged to:: Private residence Living Arrangements: Spouse/significant other Available Help at Discharge: Family Type of Home: House Home Access: Stairs to enter Secretary/administrator of Steps: 2 Entrance Stairs-Rails: None Home Layout: Two level Alternate Level Stairs-Number of Steps: 14 Alternate Level Stairs-Rails: Right Bathroom Shower/Tub: Producer, television/film/video: Standard     Home Equipment: Cane - single point   Additional Comments: wife is very supportive       Prior Functioning/Environment Level of Independence: Independent        Comments: Pt is able to perform ADLs mod I.  His bedroom is upstairs and he navigates stairs mod I.  He showers to stand, and attends [employer] boxing for Parkinson's multiple times a week         OT Problem List: Decreased strength;Decreased cognition;Decreased coordination;Decreased knowledge of use of DME or AE;Cardiopulmonary status limiting activity;Pain      OT Treatment/Interventions:      OT Goals(Current goals can be found in the care plan section) Acute Rehab OT Goals Patient Stated Goal: to have less abdominal pain  OT Goal Formulation: All assessment and education complete, DC therapy  OT Frequency:     Barriers to D/C:            Co-evaluation              AM-PAC OT "6 Clicks" Daily Activity     Outcome Measure Help from another person eating meals?: None Help from another person taking care of  personal grooming?: None Help from another person toileting, which includes using toliet, bedpan, or urinal?: A Little Help from another person bathing (including washing, rinsing, drying)?: A Little Help from another person to put on and taking off regular upper body clothing?: None Help from another person to put on and taking off regular lower body clothing?: A Little 6 Click Score: 21   End of Session Equipment Utilized During Treatment: Gait belt Nurse Communication: Mobility status  Activity Tolerance: Patient tolerated treatment well Patient left: in bed;with call bell/phone within reach;with family/visitor present  OT Visit Diagnosis: Unsteadiness on feet (R26.81)                Time: 7616-0737 OT Time Calculation (min): 39 min Charges:  OT General Charges $OT Visit: 1 Visit OT Evaluation $OT Eval Moderate Complexity: 1 Mod OT Treatments $Self Care/Home Management : 8-22 mins $Therapeutic Activity: 8-22 mins  Jeani Hawking, OTR/L Acute Rehabilitation Services Pager [phone] Office [phone]  Jeani Hawking M 05/16/2018, 12:55 PM

## 2018-05-16 NOTE — Evaluation (Signed)
Physical Therapy Evaluation Patient Details Name: ATHOL POLLAK MRN: [id number] DOB: 1934/11/05 Today's Date: 05/16/2018   History of Present Illness  This 84 y.o. male admitted with hypotension and chronic constipation.  PMH includes:   Parkinson's disease with autonomic dysfunction, Alzheimer's disease, orthostatic hypotension, paroxysmal A-Fib on Eliquis     Clinical Impression  Pt admitted with above diagnosis. Pt currently with functional limitations due to the deficits listed below (see PT Problem List). PTA pt home with wife, mod I with mobility in the home, going to 3 day a week of boxing for PD population active. Family concerned with gradual weight loss and diet. Ambulating halls with shuffle gait pattern at times, family present no concerns for return to home. Educated on potential benefits of OP neuro PT, LSVT- BIG program for PD progression.   Pt will benefit from skilled PT to increase their independence and safety with mobility to allow discharge to the venue listed below.       Follow Up Recommendations Outpatient PT(Neuro PT for PD)    Equipment Recommendations       Recommendations for Other Services       Precautions / Restrictions Precautions Precautions: Fall Precaution Comments: denies h/o falls       Mobility  Bed Mobility               General bed mobility comments: Pt sitting EOB   Transfers Overall transfer level: Needs assistance Equipment used: 1 person hand held assist Transfers: Sit to/from Stand;Stand Pivot Transfers Sit to Stand: Min assist Stand pivot transfers: Min guard       General transfer comment: assist to boost from lower surface   Ambulation/Gait Ambulation/Gait assistance: Min guard Gait Distance (Feet): 100 Feet Assistive device: None          Stairs            Wheelchair Mobility    Modified Rankin (Stroke Patients Only)       Balance Overall balance assessment: Mild deficits observed, not formally  tested             Standing balance comment: Pt able to bend forward to pull pants down over ankles with no LOB                              Pertinent Vitals/Pain Pain Assessment: Faces Faces Pain Scale: Hurts little more Pain Location: abdomen Pain Descriptors / Indicators: Aching;Pressure Pain Intervention(s): Limited activity within patient's tolerance;Monitored during session;Premedicated before session    Home Living Family/patient expects to be discharged to:: Private residence Living Arrangements: Spouse/significant other Available Help at Discharge: Family Type of Home: House Home Access: Stairs to enter Entrance Stairs-Rails: None Secretary/administrator of Steps: 2 Home Layout: Two level Home Equipment: Cane - single point Additional Comments: wife is very supportive     Prior Function Level of Independence: Independent         Comments: Pt is able to perform ADLs mod I.  His bedroom is upstairs and he navigates stairs mod I.  He showers to stand, and attends [employer] steady boxing for Parkinson's multiple times a week      Hand Dominance   Dominant Hand: Right    Extremity/Trunk Assessment   Upper Extremity Assessment Upper Extremity Assessment: Defer to OT evaluation RUE Deficits / Details: tremor noted bil.  LUE Deficits / Details: tremor noted bil.  LUE Coordination: decreased fine motor  Lower Extremity Assessment Lower Extremity Assessment: Generalized weakness    Cervical / Trunk Assessment Cervical / Trunk Assessment: Kyphotic  Communication   Communication: No difficulties  Cognition Arousal/Alertness: Awake/alert Behavior During Therapy: Flat affect Overall Cognitive Status: History of cognitive impairments - at baseline                                 General Comments: wife prestent and reports he is at baseline       General Comments General comments (skin integrity, edema, etc.): Pt and wife instructed  in use of abdominal binder, but pt requested to have it removed due to increased abdominal gas pain.  BP seated 115/52; standing 94/59; standing at 3 mins 90/53    Exercises     Assessment/Plan    PT Assessment Patient needs continued PT services  PT Problem List Decreased strength       PT Treatment Interventions DME instruction;Gait training;Stair training    PT Goals (Current goals can be found in the Care Plan section)  Acute Rehab PT Goals Patient Stated Goal: to have less abdominal pain  PT Goal Formulation: With patient/family Potential to Achieve Goals: Fair    Frequency Min 3X/week   Barriers to discharge        Co-evaluation               AM-PAC PT "6 Clicks" Mobility  Outcome Measure Help needed turning from your back to your side while in a flat bed without using bedrails?: None Help needed moving from lying on your back to sitting on the side of a flat bed without using bedrails?: A Little Help needed moving to and from a bed to a chair (including a wheelchair)?: A Little Help needed standing up from a chair using your arms (e.g., wheelchair or bedside chair)?: A Little Help needed to walk in hospital room?: A Little Help needed climbing 3-5 steps with a railing? : A Little 6 Click Score: 19    End of Session Equipment Utilized During Treatment: Gait belt Activity Tolerance: Patient tolerated treatment well Patient left: in bed Nurse Communication: Mobility status PT Visit Diagnosis: Unsteadiness on feet (R26.81)    Time: 1330-1350 PT Time Calculation (min) (ACUTE ONLY): 20 min   Charges:   PT Evaluation $PT Eval Moderate Complexity: 1 Mod          Etta Grandchild, PT, DPT Acute Rehabilitation Services Pager: [phone] Office: [phone]    Etta Grandchild 05/16/2018, 2:18 PM

## 2018-05-16 NOTE — Discharge Instructions (Signed)

## 2018-05-17 ENCOUNTER — Other Ambulatory Visit: Payer: Medicare Other | Admitting: *Deleted

## 2018-05-17 DIAGNOSIS — I951 Orthostatic hypotension: Secondary | ICD-10-CM

## 2018-05-17 DIAGNOSIS — I480 Paroxysmal atrial fibrillation: Secondary | ICD-10-CM

## 2018-05-17 DIAGNOSIS — Z7901 Long term (current) use of anticoagulants: Secondary | ICD-10-CM

## 2018-05-17 DIAGNOSIS — R634 Abnormal weight loss: Secondary | ICD-10-CM | POA: Diagnosis present

## 2018-05-17 DIAGNOSIS — G20 Parkinson's disease: Secondary | ICD-10-CM

## 2018-05-18 LAB — BASIC METABOLIC PANEL
BUN/Creatinine Ratio: 18 (ref 10–24)
BUN: 14 mg/dL (ref 8–27)
CHLORIDE: 97 mmol/L (ref 96–106)
CO2: 27 mmol/L (ref 20–29)
Calcium: 8.7 mg/dL (ref 8.6–10.2)
Creatinine, Ser: 0.79 mg/dL (ref 0.76–1.27)
GFR calc Af Amer: 97 mL/min/{1.73_m2} (ref >59)
GFR calc non Af Amer: 84 mL/min/{1.73_m2} (ref >59)
Glucose: 77 mg/dL (ref 65–99)
Potassium: 3.7 mmol/L (ref 3.5–5.2)
Sodium: 137 mmol/L (ref 134–144)

## 2018-05-21 ENCOUNTER — Telehealth: Payer: Self-pay | Admitting: Internal Medicine

## 2018-05-21 NOTE — Telephone Encounter (Signed)
New message   Patient's wife is calling about getting lab results.

## 2018-05-21 NOTE — Telephone Encounter (Signed)
Pt's wife aware of lab results, but that Dr Graciela Husbands has not made recommendations as of yet. She understands we will contact her back if he makes any recommendations.

## 2018-06-22 NOTE — Progress Notes (Signed)
Virtual Visit via Phone Note (planned for video but unable to connect that way as pt unable to figure out platform) The purpose of this virtual visit is to provide medical care while limiting exposure to the novel coronavirus.    Consent was obtained for phone visit:  Yes.   Answered questions that patient had about telehealth interaction:  Yes.   I discussed the limitations, risks, security and privacy concerns of performing an evaluation and management service by telemedicine. I also discussed with the patient that there may be a patient responsible charge related to this service. The patient expressed understanding and agreed to proceed.  Pt location: Home Physician Location: office Name of referring provider:  [name], MD I connected with Stephen Gentry at patients initiation/request on 06/23/2018 at 10:30 AM EDT by phone and verified that I am speaking with the correct person using two identifiers. Pt MRN:  [id number] Pt DOB:  1934-11-22 Video Participants:  Stephen Gentry;  wife   History of Present Illness:  Patient seen in follow-up for Parkinson's disease.  Patient currently on carbidopa/levodopa 25/100 CR, 2 tablets 3 times per day, and additional 1 at bedtime and then 1 at 2 AM.  I received a call from Dr. Nadara Mode at [facility].  The patient was participating in a study for orthostatic hypotension.  She had to call the doctor if the patient scored high on the depression scale. Wife reports that he was put on abilify since our last visit to "help his lexapro."  Seeing NP for this.  Seeing Teodora Medici.  Report that no physician is in the practice. Patient is being treated by cardiology for his orthostatic hypotension.  He is on Midodrine.  He was supposed to have a driving evaluation since our last visit.  We scheduled this at [facility].  It does not appear that he has completed that.   Multiple records have been reviewed since our last visit.  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  He was back in the hospital emergency room on May 15, 2018 with paresthesias in the fingers and toes and mild abdominal pain.  His cardiologist tried to increase his Midodrine to 10 mg but patient reported that he felt worse and he decreased the dose back to 5 mg and was only taking bid instead of tid.  Patient was orthostatic in the emergency room.  He was treated in the emergency room with IV fluids.  It was recommended that he increase the midodrine from twice daily to 3 times per day dosing and recommended that he use his abdominal compression binder or stockings regularly, which he was not doing.  Today, his wife states that he is only taking 2.5 mg 3 times per day with food.  She states he is not able to tolerate higher dosages.  He is still on Florinef, 0.1 mg daily.  He does not like the compression binder.  He has an appointment with cardiology on Monday.  Also c/o continued "extreme fatigue."  Wife also c/o paresthesias in the fingers and toes and trouble with manipulation of getting pills out of pillbox.  He has had this for a long time but increasing over the last 6-8 weeks.  No DM.  The R side is worse than L but both hands and both feet are involved.   Observations/Objective:   There were no vitals filed for this visit. Unable, had to  convert to phone visit    Chemistry      Component Value Date/Time   NA 137 05/17/2018 1417   K 3.7 05/17/2018 1417   CL 97 05/17/2018 1417   CO2 27 05/17/2018 1417   BUN 14 05/17/2018 1417   CREATININE 0.79 05/17/2018 1417   CREATININE 0.82 08/06/2017 1554      Component Value Date/Time   CALCIUM 8.7 05/17/2018 1417   ALKPHOS 54 05/15/2018 1133   AST 19 05/15/2018 1133   ALT 6 05/15/2018 1133   BILITOT 1.6 (H) 05/15/2018 1133     Lab Results  Component Value Date   VITAMINB12 546 08/06/2017   Lab Results  Component Value Date   TSH 3.824 01/23/2018      Assessment and Plan:   1.  idiopathic Parkinson's disease.  The patient has tremor, bradykinesia, rigidity and mild postural instability.             -We discussed the diagnosis as well as pathophysiology of the disease.  We discussed treatment options as well as prognostic indicators.  Patient education was provided.             -We discussed that it used to be thought that levodopa would increase risk of melanoma but now it is believed that Parkinsons itself likely increases risk of melanoma. he is to get regular skin checks.             - carbidopa/levodopa CR to 2 po tid and 1 at bed and 1 at 2am (he prefers this)             -Wife asked about taking other medication for tremor.  I explained that this would likely just cause more cognitive change and this would not be something I recommend.  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  -NP placed patient on abilify.   Explained to patient and wife that this is generally not considered appropriate for PD patients given that it is a D2 blocker and can certainly make the symptoms of Parkinson's disease worse.  Explained that this medication can cause parkinsonism, even in patients who do not have Parkinson's disease.  I would recommend that they follow-up with the prescribing nurse practitioner to discuss.  Wife asks me about finding him a psychiatrist.  I recommended Dr. Jennelle Humanottle.  2.  Alzheimer dementia             -This is overall mild.  Neurocognitive testing was done in August, 2019 and demonstrated memory change more consistent with Alzheimer's dementia than Parkinson's related dementia.  He was given Aricept and took it for 1 to 2 weeks and did not think that he felt well on it.  However, it turns out that he also did not know he had A. fib at the time and was likely the bigger issue.  Regardless, he wants no further medication.  If we decide to explore medication in the future, we  could always try Namenda.  3.  GI upset, unrelated to constipation             -He is actually doing much better now after his surgery for sigmoid volvulus.  4.  Anxiety             -This was confirmed on neurocognitive testing in August, 2019.  5. b12 deficiency             -now on injections and continuing to get  that through [facility]  6.  Fatigue             -Patient has been complaining about this to me for quite some time and I have told the patient and his wife that I did not think that it was related to Parkinson's disease.    Wife asks me about this again today.  I explained that I have done everything I know how to do in terms of changing his medications, adding medications, etc. and this is not helped.  He has other disease states that contribute to fatigue (newly diagnosed a flutter, significant orthostatic hypotension) that can contribute to fatigue.  7.  Orthostatic Hypotension             -Cardiology is managing.  Has been in the emergency room recently with orthostasis.  It appears that compliance with medication may be part of the issue.  Needs to make sure that he is following with cardiology and they report he has a follow-up Monday.  He is supposed to be on midodrine, 5 mg 3 times per day, but is taking 2.5 mg 3 times per day instead.  He is on Florinef, 0.1 mg daily.  Recommended abdominal compression binder but they state that he does not like this.  Recommended making sure he is drinking plenty of water.  Explained that low blood pressure could be contributing to the sensation of hand and feet paresthesias.  This was obviously a phone visit, so we cannot take orthostatics.  8.  Hand/Feet Paresthesias  -will schedule EMG when we are able to proceed.  Symptoms were nonfocal and nonlateralizing.  Again, explained that while this could be a symptom of neuropathy, it could also be from his orthostasis.  He is underdosed on his midodrine per cardiology.  Follow Up Instructions:     -I discussed the assessment and treatment plan with the patient. The patient was provided an opportunity to ask questions and all were answered. The patient agreed with the plan and demonstrated an understanding of the instructions.   The patient was advised to call back or seek an in-person evaluation if the symptoms worsen or if the condition fails to improve as anticipated.    Total Time spent in visit with the patient was:  25 min, of which more than 50% of the time was spent in counseling and/or coordinating care on safety.   Pt understands and agrees with the plan of care outlined.     Kerin Salen, DO

## 2018-06-23 ENCOUNTER — Telehealth (INDEPENDENT_AMBULATORY_CARE_PROVIDER_SITE_OTHER): Payer: Medicare Other | Admitting: Neurology

## 2018-06-23 ENCOUNTER — Encounter: Payer: Self-pay | Admitting: *Deleted

## 2018-06-23 ENCOUNTER — Other Ambulatory Visit: Payer: Self-pay

## 2018-06-23 DIAGNOSIS — I951 Orthostatic hypotension: Secondary | ICD-10-CM | POA: Diagnosis not present

## 2018-06-23 DIAGNOSIS — G20 Parkinson's disease: Secondary | ICD-10-CM

## 2018-06-23 DIAGNOSIS — F331 Major depressive disorder, recurrent, moderate: Secondary | ICD-10-CM

## 2018-06-23 DIAGNOSIS — R5383 Other fatigue: Secondary | ICD-10-CM | POA: Diagnosis not present

## 2018-06-24 ENCOUNTER — Other Ambulatory Visit: Payer: Self-pay | Admitting: *Deleted

## 2018-06-24 DIAGNOSIS — R202 Paresthesia of skin: Secondary | ICD-10-CM

## 2018-06-25 ENCOUNTER — Telehealth: Payer: Self-pay

## 2018-06-25 NOTE — Telephone Encounter (Signed)
I called and spoke with patients wife. She gave verbal consent for telehealth visit on Monday 06/28/18. She also wanted Dr. Graciela Husbands to know that the patient has been very anxious and depressed recently; the depression made patient have suicidal ideations. Patients wife contacted a mental health facility and he was prescribed Abilify 2MG . Patients wife wanted to update our office.

## 2018-06-28 ENCOUNTER — Telehealth (INDEPENDENT_AMBULATORY_CARE_PROVIDER_SITE_OTHER): Payer: Medicare Other | Admitting: Internal Medicine

## 2018-06-28 ENCOUNTER — Other Ambulatory Visit: Payer: Self-pay

## 2018-06-28 ENCOUNTER — Telehealth: Payer: Self-pay

## 2018-06-28 VITALS — BP 132/66 | HR 66 | Ht 73.0 in | Wt 162.5 lb

## 2018-06-28 DIAGNOSIS — I951 Orthostatic hypotension: Secondary | ICD-10-CM

## 2018-06-28 DIAGNOSIS — G20 Parkinson's disease: Secondary | ICD-10-CM

## 2018-06-28 DIAGNOSIS — I480 Paroxysmal atrial fibrillation: Secondary | ICD-10-CM
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
## 2018-06-28 NOTE — Telephone Encounter (Signed)
LVM per DPR regarding after visit recommendations from Dr Graciela Husbands. Pt is to call the office to follow up in 2 weeks with a BP reading after decreasing abilify to every other day.

## 2018-07-02 ENCOUNTER — Telehealth: Payer: Self-pay | Admitting: Neurology

## 2018-07-02 NOTE — Telephone Encounter (Signed)
His cardiologist is working on making several changes.  He just saw the cardiologist on 4/20.   His cardiologist believes, as do I, that he probably doesn't feel well because of BP issues and is working on adjusting things to help that. I don't think that there are additional changes I can make.  If he feels like he is "going to die" he needs to go to the ER for evaluation.  This is not a primary parkinsons issue

## 2018-07-02 NOTE — Telephone Encounter (Signed)
[name] called regarding patient and him feeling horrible. She said he told her he feels "like he is dying". She said he has no energy to even put on his shoes. She said he does not have a fever or Covid. She's not sure what to do? She said she did not want to take him to the Hospital. Please Call. Thanks

## 2018-07-06 ENCOUNTER — Telehealth: Payer: Self-pay | Admitting: *Deleted

## 2018-07-06 ENCOUNTER — Telehealth: Payer: Self-pay | Admitting: Neurology

## 2018-07-06 ENCOUNTER — Telehealth: Payer: Self-pay | Admitting: Internal Medicine

## 2018-07-06 NOTE — Telephone Encounter (Signed)
Patient's wife Stephen Gentry called stating Stephen Gentry is feeling very fatigued, like his legs can collapsed.  Does have history of A-Fib. Today at 12:15 101/57 HR 83 standing wife states was in A-FIB  4/27 At 3:30 105/67 HR 85 standing  Wife states was in A-FIB  Noon    125/74 HR 74 standing             10am    104/56 HR 70 standing   He did tell his wife that "If he had a gun he would kill himself"

## 2018-07-06 NOTE — Telephone Encounter (Signed)
Deputy Stephen Gentry called and said that patient is fine and has no firearms in the house.  His wife also told the deputy that there are no firearms in the house.

## 2018-07-06 NOTE — Telephone Encounter (Signed)
Marylu Lund (nurse)--pt wife called and stated seen Dr. Arbutus Leas last week. Pt have, depress, no energy, helpless,both legs are weak, but no pain. Pt told wife that if he had a gun he will shoot himself and I told the nurse best to call 911. But the nurse stated his not really trying to hurt himself...stated by pt wife..and need to see Dr Tat...please advise

## 2018-07-06 NOTE — Telephone Encounter (Signed)
Lm to call back ./cy 

## 2018-07-06 NOTE — Telephone Encounter (Signed)
Attempted to call pt and phone rings and sounds like someone picks up and then disconnects will try later ./cy

## 2018-07-06 NOTE — Telephone Encounter (Signed)
Has anyone relayed my 4/24 phone message to her?  Doesn't look like they were called back.  I think that we probably need to call 911 for them if he is that depressed.

## 2018-07-06 NOTE — Telephone Encounter (Signed)
Ashley--called 911 and gave pt information to check on him.

## 2018-07-07 ENCOUNTER — Telehealth: Payer: Self-pay | Admitting: Neurology

## 2018-07-07 NOTE — Telephone Encounter (Signed)
Called pt wife--just to follow up from yesterday episode threatening to harm himself. Pt wife stated doing well, resting, and not having pain but still have weakness on both legs and even went outside to get some fresh air.
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
## 2018-07-08 ENCOUNTER — Ambulatory Visit: Payer: Medicare Other | Admitting: Neurology

## 2018-07-12 ENCOUNTER — Telehealth: Payer: Self-pay | Admitting: Neurology

## 2018-07-12 NOTE — Telephone Encounter (Signed)
See prior office note.  Unfortunately, he has some degree of levodopa resistant tremor (that means med works for other things - slowness/stiffness - but not tremor), so increasing that won't help.  As I have told her/them in the past, adding additional tremor meds won't be good as it will increase risk of confusion and falls.

## 2018-07-12 NOTE — Telephone Encounter (Signed)
Spouse called regarding Everado's Tremors becoming worse. She said the Carbidopa Levodopa is not seeming to help. Please Call. Thanks

## 2018-07-13 NOTE — Telephone Encounter (Signed)
Notified pt wife--Rx levodopa--resistant tremor--meds work for other things-slowness/stiffness-but not tremor. Also, addition tremor--would increase risk of confusion and fall by Dr. Arbutus Leas. Pt wife understood and will keep monitoring the pt.

## 2018-07-22 ENCOUNTER — Emergency Department (HOSPITAL_COMMUNITY): Payer: Medicare Other

## 2018-07-22 ENCOUNTER — Telehealth: Payer: Self-pay | Admitting: Internal Medicine

## 2018-07-22 ENCOUNTER — Emergency Department (HOSPITAL_COMMUNITY)
Admission: EM | Admit: 2018-07-22 | Discharge: 2018-07-22 | Disposition: A | Discharge status: Home / Self Care | Payer: Medicare Other | Attending: Emergency Medicine | Admitting: Emergency Medicine

## 2018-07-22 ENCOUNTER — Other Ambulatory Visit: Payer: Self-pay

## 2018-07-22 DIAGNOSIS — Z7901 Long term (current) use of anticoagulants: Secondary | ICD-10-CM | POA: Insufficient documentation

## 2018-07-22 DIAGNOSIS — R5383 Other fatigue: Secondary | ICD-10-CM | POA: Diagnosis not present

## 2018-07-22 DIAGNOSIS — G309 Alzheimer's disease, unspecified: Secondary | ICD-10-CM | POA: Insufficient documentation

## 2018-07-22 DIAGNOSIS — R531 Weakness: Secondary | ICD-10-CM

## 2018-07-22 DIAGNOSIS — Z87891 Personal history of nicotine dependence: Secondary | ICD-10-CM | POA: Diagnosis not present

## 2018-07-22 DIAGNOSIS — G20 Parkinson's disease: Secondary | ICD-10-CM

## 2018-07-22 DIAGNOSIS — Z79899 Other long term (current) drug therapy: Secondary | ICD-10-CM | POA: Diagnosis not present

## 2018-07-22 LAB — CBC WITH DIFFERENTIAL/PLATELET
Abs Immature Granulocytes: 0.01 10*3/uL (ref 0.00–0.07)
Basophils Absolute: 0 10*3/uL (ref 0.0–0.1)
Basophils Relative: 1 %
Eosinophils Absolute: 0 10*3/uL (ref 0.0–0.5)
Eosinophils Relative: 1 %
HCT: 41.7 % (ref 39.0–52.0)
Hemoglobin: 13.8 g/dL (ref 13.0–17.0)
Immature Granulocytes: 0 %
Lymphocytes Relative: 34 %
Lymphs Abs: 1.4 10*3/uL (ref 0.7–4.0)
MCH: 29.9 pg (ref 26.0–34.0)
MCHC: 33.1 g/dL (ref 30.0–36.0)
MCV: 90.5 fL (ref 80.0–100.0)
Monocytes Absolute: 0.5 10*3/uL (ref 0.1–1.0)
Monocytes Relative: 11 %
Neutro Abs: 2.2 10*3/uL (ref 1.7–7.7)
Neutrophils Relative %: 53 %
Platelets: UNDETERMINED 10*3/uL (ref 150–400)
RBC: 4.61 MIL/uL (ref 4.22–5.81)
RDW: 12.6 % (ref 11.5–15.5)
WBC: 4.2 10*3/uL (ref 4.0–10.5)
nRBC: 0 % (ref 0.0–0.2)

## 2018-07-22 LAB — URINALYSIS, ROUTINE W REFLEX MICROSCOPIC
Bilirubin Urine: NEGATIVE
Glucose, UA: NEGATIVE mg/dL
Hgb urine dipstick: NEGATIVE
Ketones, ur: 5 mg/dL — AB
Leukocytes,Ua: NEGATIVE
Nitrite: NEGATIVE
Protein, ur: NEGATIVE mg/dL
Specific Gravity, Urine: 1.021 (ref 1.005–1.030)
pH: 5 (ref 5.0–8.0)

## 2018-07-22 LAB — COMPREHENSIVE METABOLIC PANEL
ALT: 8 U/L (ref 0–44)
AST: 21 U/L (ref 15–41)
Albumin: 3.9 g/dL (ref 3.5–5.0)
Alkaline Phosphatase: 56 U/L (ref 38–126)
Anion gap: 10 (ref 5–15)
BUN: 16 mg/dL (ref 8–23)
CO2: 28 mmol/L (ref 22–32)
Calcium: 9.4 mg/dL (ref 8.9–10.3)
Chloride: 100 mmol/L (ref 98–111)
Creatinine, Ser: 0.79 mg/dL (ref 0.61–1.24)
GFR calc Af Amer: >60 mL/min (ref >60)
GFR calc non Af Amer: >60 mL/min (ref >60)
Glucose, Bld: 90 mg/dL (ref 70–99)
Potassium: 3.7 mmol/L (ref 3.5–5.1)
Sodium: 138 mmol/L (ref 135–145)
Total Bilirubin: 1.1 mg/dL (ref 0.3–1.2)
Total Protein: 6.3 g/dL — ABNORMAL LOW (ref 6.5–8.1)

## 2018-07-22 LAB — TROPONIN I: Troponin I: <0.03 ng/mL (ref <0.03)

## 2018-07-22 LAB — CK: Total CK: 115 U/L (ref 49–397)

## 2018-07-22 MED ORDER — HALOPERIDOL 1 MG PO TABS
2.0000 mg | ORAL_TABLET | Freq: Once | ORAL | Status: AC
  Administered 2018-07-22: 17:00:00 2 mg via ORAL
  Filled 2018-07-22: qty 2

## 2018-07-22 NOTE — Telephone Encounter (Signed)
New message     Pt c/o BP issue: STAT if pt c/o blurred vision, one-sided weakness or slurred speech  1. What are your last 5 BP readings? 91/63 bp  HR 88   2. Are you having any other symptoms (ex. Dizziness, headache, blurred vision, passed out)? Atrial flutter, wife states that patient did pass out on Monday at the PCP office.  3. What is your BP issue? Wife states that his b/p is low.

## 2018-07-22 NOTE — ED Notes (Signed)
Discharge instruction reviewed with patient and wife.

## 2018-07-22 NOTE — Telephone Encounter (Signed)
The patient's spouse called because the patient continues to decline.  He just "feels terrible, weak, and feels like he could collapse."  He wants to go to the ED, but the spouse is reluctant to do that.    His BP continues to remain low 91/63 HR 88 today.  On Monday, while in the PCP's office his BP dropped to 82/50 and he felt faint.  She just is reaching out to Dr Graciela Husbands to see if anything else can be done from his standpoint.
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
## 2018-07-22 NOTE — ED Notes (Signed)
[name] (wife): [phone]

## 2018-07-22 NOTE — ED Notes (Signed)
Patient transported to CT 

## 2018-07-22 NOTE — Discharge Instructions (Signed)
Continue your current meds   Call neurologist and psychiatrist for follow up   Return to ER if you have worse weakness, chest pain, trouble speaking.

## 2018-07-22 NOTE — ED Triage Notes (Signed)
Pt reports increased weakness over the past two weeks. Pt also reports shortness of breath with exertion. Pt denying any coughing, fevers,chest pain, pain, nausea/vomiting.  Pt is alert and oriented x4. Pt denying any pain at present time. Pt reports he has been using a cane recently in the past two weeks to ambulate and that is new for him.

## 2018-07-26 ENCOUNTER — Telehealth: Payer: Self-pay | Admitting: Internal Medicine

## 2018-07-26 NOTE — Telephone Encounter (Signed)
Spoke with pt's wife. She states she received a voicemail from me wanting to discuss BP readings. I advised her that was from earlier in April and we have since discussed his BP's since then.   She states she is frustrated with pt's ongoing anxiety and weakness. Currently, he is pacing up and down the sidewalk with his cane because he cannot stay still. She asked if there was anything Dr Graciela Husbands could give him to help his anxiety. I recommended she start with pt's PCP or neurologist. She agrees with this plan and has already left messages with his other physicians.

## 2018-07-26 NOTE — Telephone Encounter (Signed)
Wife of pt called to speak with Lorren. She said she spoke with Lorren a week or so ago, and has results from monitoring her husband's health

## 2018-07-26 NOTE — Telephone Encounter (Signed)
Pt's needs were addressed in ED visit following this call.

## 2018-07-27 ENCOUNTER — Telehealth: Payer: Self-pay | Admitting: Neurology

## 2018-07-27 NOTE — Telephone Encounter (Signed)
Called patient souse back she is very upset and requesting  to take patient away.  Pt spouse is very angry crying and wanting to drop patient off on the side of road. She states that she can't live with patient the way he is and he cant live like this anymore.  Pt spouse states that patient feels like his dying and tremors are getting worst, he's not eating and she tried all she can  She spoke with patient PCP for help. Pt PCP prescribe seroquil 25 mg yesterday. Pt has taken 1 tablet last night.   She has look into getting patient into a assistant living  Please advise

## 2018-07-27 NOTE — Telephone Encounter (Signed)
Called spoke with patient wife  She state once phychiatrist start seeing patient in office she will be making appt with them She is aware and understand PCP & Dr. Arbutus Leas agreement with PCP plan of care of patient.   Denied caregiver group

## 2018-07-27 NOTE — Telephone Encounter (Signed)
Reviewed PCP notes.  Called PCP and had a good conversation and greatly appreciate the input.  Per notes, pts wife has told physicians that I have said to stop the abilify.  I have not said that.  I said that Abilify could worsen parkinsonism and that she/he should talk with prescribing provider about that medication.  He is off of it and PCP started seroquel yesterday.  This is a good medication for PD patients and I agree with this choice.  PCP has talked with pt/wife about palliative care.  PCP states metabolically patient is doing well and we discussed that from a pure PD standpoint, the patient has been doing well (he does have dysautonomia and cardiology is assisting with that treatment).  The biggest issues at this point are the anxiety and he cannot get into see psychiatry because of Covid, and that is why PCP is treating with the seroquel.  PCP will discuss other resources for wife as well (we discussed PD caregiver group but currently not meeting because of covid but couseling may be good option).  Apple, please let pt/wife know that I had a nice conversation with the PCP and agree with his treatment of the patient.  Also, if wife needs respite care (will need to pay out of pocket), Maralyn Sago can assist with that.

## 2018-07-27 NOTE — Telephone Encounter (Signed)
Patient's wife called regarding having a question about Stephen Gentry's Medication that he was prescribed yesterday. Please Call. Thanks

## 2018-07-28 ENCOUNTER — Telehealth: Payer: Self-pay | Admitting: Neurology

## 2018-07-28 NOTE — Telephone Encounter (Signed)
Medications generally don't help the freezing and going up on the carbidopa/levodopa 25/100 will only worsen memory/cognition (have discussed this with them previous).  All the other meds will also make cognition worse.  Physical therapy can help the gait/balance and can get that in the home if they wish.

## 2018-07-28 NOTE — Telephone Encounter (Signed)
Wife was calling in about patient's carbidopa levodopa medication. She said he is worsening- freezing up and taking really small steps. She wanted to know if there was any other medication he could take to help the Parkinsons. Please call her back. Thanks!

## 2018-07-28 NOTE — Telephone Encounter (Signed)
Spoke with patient spouse yesterday  ~Dr. Tat Please advise another recommendation from yesterday message

## 2018-07-29 ENCOUNTER — Emergency Department (HOSPITAL_COMMUNITY)
Admission: EM | Admit: 2018-07-29 | Discharge: 2018-07-29 | Disposition: A | Discharge status: Home / Self Care | Payer: Medicare Other | Attending: Emergency Medicine | Admitting: Emergency Medicine

## 2018-07-29 ENCOUNTER — Encounter (HOSPITAL_COMMUNITY): Payer: Self-pay | Admitting: Emergency Medicine

## 2018-07-29 ENCOUNTER — Other Ambulatory Visit: Payer: Self-pay

## 2018-07-29 DIAGNOSIS — Z79899 Other long term (current) drug therapy: Secondary | ICD-10-CM | POA: Insufficient documentation

## 2018-07-29 DIAGNOSIS — R531 Weakness: Secondary | ICD-10-CM

## 2018-07-29 DIAGNOSIS — F0280 Dementia in other diseases classified elsewhere without behavioral disturbance: Secondary | ICD-10-CM | POA: Insufficient documentation

## 2018-07-29 DIAGNOSIS — Z87891 Personal history of nicotine dependence: Secondary | ICD-10-CM | POA: Diagnosis not present

## 2018-07-29 DIAGNOSIS — G309 Alzheimer's disease, unspecified: Secondary | ICD-10-CM | POA: Insufficient documentation

## 2018-07-29 DIAGNOSIS — F419 Anxiety disorder, unspecified: Secondary | ICD-10-CM

## 2018-07-29 DIAGNOSIS — G20 Parkinson's disease: Secondary | ICD-10-CM

## 2018-07-29 DIAGNOSIS — R5383 Other fatigue: Secondary | ICD-10-CM | POA: Insufficient documentation

## 2018-07-29 DIAGNOSIS — Z7901 Long term (current) use of anticoagulants: Secondary | ICD-10-CM | POA: Insufficient documentation

## 2018-07-29 LAB — BASIC METABOLIC PANEL
Anion gap: 9 (ref 5–15)
BUN: 17 mg/dL (ref 8–23)
CO2: 27 mmol/L (ref 22–32)
Calcium: 9.5 mg/dL (ref 8.9–10.3)
Chloride: 102 mmol/L (ref 98–111)
Creatinine, Ser: 0.76 mg/dL (ref 0.61–1.24)
GFR calc Af Amer: >60 mL/min (ref >60)
GFR calc non Af Amer: >60 mL/min (ref >60)
Glucose, Bld: 96 mg/dL (ref 70–99)
Potassium: 3.8 mmol/L (ref 3.5–5.1)
Sodium: 138 mmol/L (ref 135–145)

## 2018-07-29 LAB — URINALYSIS, ROUTINE W REFLEX MICROSCOPIC
Bilirubin Urine: NEGATIVE
Glucose, UA: NEGATIVE mg/dL
Hgb urine dipstick: NEGATIVE
Ketones, ur: 5 mg/dL — AB
Leukocytes,Ua: NEGATIVE
Nitrite: NEGATIVE
Protein, ur: NEGATIVE mg/dL
Specific Gravity, Urine: 1.024 (ref 1.005–1.030)
pH: 5 (ref 5.0–8.0)

## 2018-07-29 LAB — CBC
HCT: 40.6 % (ref 39.0–52.0)
Hemoglobin: 13.6 g/dL (ref 13.0–17.0)
MCH: 30 pg (ref 26.0–34.0)
MCHC: 33.5 g/dL (ref 30.0–36.0)
MCV: 89.6 fL (ref 80.0–100.0)
Platelets: UNDETERMINED 10*3/uL (ref 150–400)
RBC: 4.53 MIL/uL (ref 4.22–5.81)
RDW: 12.6 % (ref 11.5–15.5)
WBC: 4.2 10*3/uL (ref 4.0–10.5)
nRBC: 0 % (ref 0.0–0.2)

## 2018-07-29 LAB — CBG MONITORING, ED: Glucose-Capillary: 89 mg/dL (ref 70–99)

## 2018-07-29 MED ORDER — SODIUM CHLORIDE 0.9% FLUSH: 3.0000 mL | Freq: Once | INTRAVENOUS | Status: DC
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
## 2018-07-29 NOTE — Telephone Encounter (Signed)
Called spoke with patient spouse she states that she understand

## 2018-07-29 NOTE — ED Triage Notes (Signed)
Pt brought in by wife, has advanced Parkinsons, reports extreme weakness x 2 weeks, pain at postop site on left lower abdomen (no redness noted).

## 2018-07-29 NOTE — Telephone Encounter (Signed)
My recommendations are the same as yesterday regarding tremor.  Its documented in my note as well that going up on meds is not going to help tremor.  Also, he has dementia as we have discussed and that is where the "cloudy" fog comes from.   Dementia does not get better.  He is already on meds.  He just got put on meds from PCP this week for other things and wife needs to give that time to work.  I think wife is also overwhelmed and would recommend some respite care for her as we have previously.

## 2018-07-29 NOTE — Telephone Encounter (Signed)
Called patient spouse back to give provider response below she is aware and understand but now wants provider recommendations on most recent message left this morning on 8:46 am  Please advise

## 2018-07-29 NOTE — Telephone Encounter (Signed)
Patient's wife is calling in again about medication and about him not having any appetite. His tremor is worse and he is acting like he has a cloudy brain. Just kind of out of it sometimes. Please call her back at [phone]. Thanks!

## 2018-07-29 NOTE — ED Notes (Signed)
Pt family member banded and brought to triage due to h of memory loss

## 2018-08-03 ENCOUNTER — Encounter (HOSPITAL_COMMUNITY): Payer: Self-pay

## 2018-08-03 ENCOUNTER — Other Ambulatory Visit: Payer: Self-pay

## 2018-08-03 ENCOUNTER — Emergency Department (HOSPITAL_COMMUNITY)
Admission: EM | Admit: 2018-08-03 | Discharge: 2018-08-04 | Disposition: A | Discharge status: Home / Self Care | Payer: Medicare Other | Attending: Emergency Medicine | Admitting: Emergency Medicine

## 2018-08-03 DIAGNOSIS — Z7901 Long term (current) use of anticoagulants: Secondary | ICD-10-CM | POA: Insufficient documentation

## 2018-08-03 DIAGNOSIS — Z79899 Other long term (current) drug therapy: Secondary | ICD-10-CM | POA: Insufficient documentation

## 2018-08-03 DIAGNOSIS — G20 Parkinson's disease: Secondary | ICD-10-CM | POA: Diagnosis present

## 2018-08-03 DIAGNOSIS — Z87891 Personal history of nicotine dependence: Secondary | ICD-10-CM | POA: Diagnosis not present

## 2018-08-03 NOTE — Telephone Encounter (Signed)
If it were an in office visit, she could not accompany the patient per [facility] policy.  Therefore, an evisit would be more appropriate for this patient.  I'm not sure that she understands the other meds although we have discussed them extensively previously.  Annabelle Harman, please make evisit on a day when I am in the office (see me to schedule)

## 2018-08-03 NOTE — Telephone Encounter (Signed)
Are you willing to speak with her?

## 2018-08-03 NOTE — Telephone Encounter (Signed)
Called spoke with patient spouse she said he is having OFF PERIOD/ EPISODES. She states that his carbiodpa-levodopa is not working. She states that it is not psychiatric this is a Parkinson issues.   OFF PERIODS: SHE C/O TREMORS, SLOW MOVEMENTS, PROBLEMS WITH BALANCE, UNABLE TO PICK UP ANYTHING, NUMBNESS, STIFFNESS, PROBLEMS GETTING OUT OF CHAIR, FATIGUE, RESTLESS, TROUBLE SPEAKING.  Pt wife is not asking for more carbidopa-levodopa she states  Request possibly changing medication to one of the new medication for his severe off episodes she mentioned INBRIJA , NOURIANZ  Pt spouse  requesting a in office appt before sept.

## 2018-08-03 NOTE — ED Triage Notes (Signed)
Pt BIB wife for eval of parkinsons disease. Wife reports pt has been on sinemet for 6 years, but efficacy periods are decreasing stating that he feels worse vs better for nearly the majority of the day. States that neurology has stated they can only see him on 6/4.

## 2018-08-03 NOTE — Telephone Encounter (Signed)
I use my nursing/MA staff to talk with patients on the phone so that I can see patients in the day.  I don't take care of his psychiatric issues (if that is what she means by episodes).  I am his parkinsons physician.  His primary care doc has just last week started him on med for this and may need to call them back to discuss.

## 2018-08-03 NOTE — Telephone Encounter (Signed)
Pt spouse called back she was informed of Dr. Arbutus Leas response that her concerns will be addressed during patient office visit. Pt spouse proceeds to say "REALLY WELL I WILL BE LOOKING FOR ANOTHER NEUROLOGIST" then hangs the phone up.

## 2018-08-03 NOTE — Telephone Encounter (Signed)
Spoke with staff.  Pts wife was offered in person visit but declined and took the evisit appt (which I agreed with so that wife could safely participate).  However, pts wife wanted staff to then address the things that were going to be addressed in the evisit (whether to start nourianz/inbrija, whether or not he was having "off" episodes) today on the phone with her.  I told staff that is what the upcoming evisit was for and wife got upset.  These things are too complex for a staff conversation.   Wife relayed to front staff that she didn't think that we were returning phone calls (we have documented calls and return calls from staff, sometimes multiple per day) and was unhappy that I have changed out medical assistants.  Wife got angry and told staff that she would be finding a new neurologist and hung up on staff.

## 2018-08-03 NOTE — Telephone Encounter (Signed)
Patient's wife called and is needing to speak with Dr. Arbutus Leas regarding Stephen Gentry. She said that he is very Agitated and his episodes are worse. She said she only wants to speak with Dr. Arbutus Leas. Please Call [phone]. Thanks

## 2018-08-04 ENCOUNTER — Encounter: Payer: Self-pay | Admitting: Neurology

## 2018-08-04 ENCOUNTER — Telehealth: Payer: Self-pay | Admitting: Neurology
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
## 2018-08-04 NOTE — Telephone Encounter (Signed)
Received staff message from ER physician in addition to correspondence from front office staff.  Front office staff states pt wife called last evening after I had left and yelled and stated that I didn't care about the patient and it was "ridiculous" that pts appt wasn't until 6/4 (this was after she dismissed me from pts care) and she was taking pt to ER.  She did that.  ER physician sent staff message that pt looked well except for anxiety and constipation.  Pts wife had requested that ER start a list of meds (nourianz, inbrija) and ER physician rightfully didn't feel comfortable with that.  Wife also expressed that they were going to be transferring care to [facility], would you call patients wife today.  She dismissed [place] yesterday from his care.  I do think that she is overwhelmed and has declined resources from [place] but looks like accepted a home health referral from the ER last night and am glad to see that.

## 2018-08-05 ENCOUNTER — Encounter: Payer: Medicare Other | Admitting: Neurology

## 2018-08-05 ENCOUNTER — Telehealth: Payer: Self-pay | Admitting: Neurology

## 2018-08-05 DIAGNOSIS — G20 Parkinson's disease: Secondary | ICD-10-CM

## 2018-08-05 DIAGNOSIS — G20A1 Parkinson's disease without dyskinesia, without mention of fluctuations: Secondary | ICD-10-CM

## 2018-08-05 NOTE — Telephone Encounter (Signed)
Please call pt/wife and see if today at 3:30 would be a good time for me to call and talk with them

## 2018-08-05 NOTE — Telephone Encounter (Signed)
Spoke with patient's wife.   My office manager, Eber Jones,  was also present for the conversation.   Patients wife stated that the husband was present on the telephone as well, although he did not speak.  Once I introduced myself on the telephone, the patient's wife immediately stated "I am so sorry for my horrendous behavior.  I owe you a huge apology."  I explained to the patient/wife that I did not call for an apology, but that I understood that they were unhappy with our care and also understood that they were very overwhelmed.  I expressed to the patient's wife that I wish she would take advantage of respite care and caregiving resources.  She stated that she felt like the patient was dying.  I asked why she was feeling that way, especially when the emergency room expressed that they mostly felt that the patient had anxiety.  She agreed and stated that the anxiety had become overwhelming and she has now taken on the anxiety for herself.  She also stated that she has been reading on the web about off states and some of these she expresses as his anxiety, but often times she just describes him as having small steps (but this is just much of the time).  He has to use a cane to get up.  This has become frustrating for him and her.  I told her that physical therapy/LSVT would be the best thing for that.  The patient/wife was agreeable to this and I will send therapy to the home.  The patient's wife asked me about inbrija and once I explained that this was inhaled levodopa, she stated that she would not want that.  We then discussed Nourianz as well as SL apomorphine.  Ultimately, I don't think he would benefit from any of these.  We discussed fully the concept of levodopa resistant tremor again.  The patient's wife thanked me for the detailed explanation.  The patient's wife has already asked primary care for a referral to [facility] neurology, since she had dismissed me from their care.  [place], please  send referral to [facility] for LSVT BIG.  Dx:  PD

## 2018-08-05 NOTE — Telephone Encounter (Signed)
Patient wife state that today at 3:30 would be a good time for you to call her, please use the home number

## 2018-08-06 ENCOUNTER — Telehealth: Payer: Self-pay | Admitting: Neurology

## 2018-08-06 NOTE — Telephone Encounter (Signed)
Patient dismissed from [facility] by Lurena Joiner Tat DO, effective 08/04/18. Dismissal Letter sent out by 1st class mail. KLM

## 2018-08-06 NOTE — Telephone Encounter (Signed)
Order for home health placed

## 2018-08-10 ENCOUNTER — Ambulatory Visit: Payer: Medicare Other | Admitting: Neurology

## 2018-08-12 ENCOUNTER — Telehealth: Payer: Medicare Other | Admitting: Neurology

## 2018-08-19 ENCOUNTER — Telehealth: Payer: Self-pay | Admitting: Internal Medicine

## 2018-08-19 MED ORDER — MIDODRINE HCL 10 MG PO TABS: 10.0000 mg | ORAL_TABLET | Freq: Three times a day (TID) | ORAL | Status: DC

## 2018-08-19 NOTE — Telephone Encounter (Signed)
New message   Today 65/41 BP wife would like to  how much Midodrine can pt take when low  bp is this low.   Please give her a call back.

## 2018-08-19 NOTE — Telephone Encounter (Signed)
Stephen Gentry lets increase his promatine to 10 mg tid ( 0700,1100,1500h) thx

## 2018-08-19 NOTE — Telephone Encounter (Signed)
Pt's wife calling today to discuss Damico's recent hypotension. She states in the mornings, he is frequently SBP in the 60's. Today it measured 65/41 upon standing and he was weak on his feet. She gave him an extra 5mg  Midodrine and his pressure increased to 86/50. She would like to know if there could start giving him a higher dose of midodrine.   I spoke with Dr. Tamala Julian, DOD, today regarding medication adjustment for pt. He would like for the pt to increase his fluid and sodium intake. Pt should also be laying supine and resting as much as possible. If he becomes too hypotensive with symptoms, pt's wife is to call EMS for assistance.   I discussed Dr Thompson Caul advise with pt's wife. She understands Dr Caryl Comes is out of the office and Dr Tamala Julian did not want to make med changes today. I will send and discuss with Dr Caryl Comes next week when he returns. In the meantime, she agrees with increased fluid and sodium intake. She will call EMS if necessary.

## 2018-08-19 NOTE — Telephone Encounter (Signed)
Notified pt's wife of medication adjustment. She will follow up with [place] next week if she does not see an improvement.

## 2018-08-26 ENCOUNTER — Other Ambulatory Visit (HOSPITAL_COMMUNITY): Payer: Self-pay | Admitting: Nurse Practitioner

## 2018-08-31 ENCOUNTER — Encounter: Payer: Medicare Other | Admitting: Neurology

## 2018-09-02 ENCOUNTER — Encounter: Payer: Medicare Other | Admitting: Neurology

## 2018-09-06 ENCOUNTER — Ambulatory Visit (INDEPENDENT_AMBULATORY_CARE_PROVIDER_SITE_OTHER): Payer: Medicare Other | Admitting: Psychiatry

## 2018-09-06 ENCOUNTER — Encounter: Payer: Self-pay | Admitting: Psychiatry

## 2018-09-06 ENCOUNTER — Other Ambulatory Visit: Payer: Self-pay

## 2018-09-06 VITALS — BP 110/70 | HR 65 | Wt 161.0 lb

## 2018-09-06 DIAGNOSIS — F0280 Dementia in other diseases classified elsewhere without behavioral disturbance: Secondary | ICD-10-CM

## 2018-09-06 DIAGNOSIS — G20 Parkinson's disease: Secondary | ICD-10-CM | POA: Diagnosis not present

## 2018-09-06 DIAGNOSIS — F320 Major depressive disorder, single episode, mild: Secondary | ICD-10-CM | POA: Diagnosis not present

## 2018-09-06 DIAGNOSIS — F32A Depression, unspecified: Secondary | ICD-10-CM

## 2018-09-06 MED ORDER — QUETIAPINE FUMARATE 25 MG PO TABS: 25.0000 mg | ORAL_TABLET | Freq: Two times a day (BID) | ORAL | 1 refills | Status: DC
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
## 2018-09-06 NOTE — Patient Instructions (Signed)
Switch quetiapine to 1/2 tablet twice daily for 4-5 days and if not effective but tolerated , then change to 1 tablet in the morning.  If still not effective increase to 25 mg  Twice daily.

## 2018-09-08 ENCOUNTER — Telehealth: Payer: Self-pay | Admitting: Internal Medicine

## 2018-09-08 NOTE — Telephone Encounter (Signed)
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 He has started having issues with constipation again even with her giving him 2 doses of miralax/day. She states he took an enema yesterday and had a small BM. He wanted to take another one today and she is concerned. She wants to know how often/frequent he can use an enema. She also doesn't know if part of him wanting to have a BM every day and c/o stomach pain is related to his dementia. Please advise.

## 2018-09-09 NOTE — Telephone Encounter (Signed)
Spoke with pts wife and she is aware of recommendations. She states the linzess did not help in the past, they will use the miralax and enemas prn.

## 2018-09-09 NOTE — Telephone Encounter (Signed)
We can go back to Linzess if necessary He was previously on Linzess 290 mcg, we could start there and then reduce dose if too frequent stools or diarrhea results Linzess may be easier for them than an enema. An enema, can be used daily if necessary but given issues with administration Linzess would likely be a better option MiraLAX remains safe also

## 2018-09-16 ENCOUNTER — Other Ambulatory Visit: Payer: Self-pay | Admitting: Neurology

## 2018-09-21 ENCOUNTER — Other Ambulatory Visit (HOSPITAL_COMMUNITY): Payer: Self-pay | Admitting: Nurse Practitioner

## 2018-09-21 NOTE — Telephone Encounter (Signed)
Age 84, weight 73kg, SCr 0.76 on 07/29/18, last OV 06/2018 afib indication

## 2018-09-24 ENCOUNTER — Telehealth: Payer: Self-pay | Admitting: Physician Assistant

## 2018-09-24 NOTE — Telephone Encounter (Signed)
Doc of the Day Patient of [facility].  Advanced Parkinson's disease. Spoke with spouse. Patient complains of stomach pain and cramps after eating even a small amount. He does not have daily bowel movements. Most bowel movements are created using an enema. He takes twice daily Miralax and daily Citrucel. His stomach pain is preventing him from eating. Weight loss is a concern. She is very concerned about the stomach pain.  Last bowel movement was Monday. He was seen in [facility] this week. Told he was constipated and sent home. Can you make any recommendations?

## 2018-09-24 NOTE — Telephone Encounter (Signed)
Spoke with the spouse. She understands the plan. She has Magnesium Citrate on hand. She would have to leave the home to obtain Dulcolax suppositories. Asks if she can try this first.

## 2018-09-24 NOTE — Telephone Encounter (Signed)
Please advise patient to use Dulcolax suppository every day as needed to stimulate the rectum.  Continue MiraLAX twice daily and fiber supplements.  If still not having bowel movement, will need to do a bowel purge with MiraLAX and Gatorade

## 2018-09-24 NOTE — Telephone Encounter (Signed)
Pt's wife called to report that pt has advanced Parkinson's and is down to 151 lbs.  Pt has had a loss of appetite and gets abd p after every meal.   Wife is concerned that pt will lose more weight and requested a sooner appt. Pt is currently scheduled 09/29/18 with Amy.

## 2018-09-27 NOTE — Telephone Encounter (Signed)
ok 

## 2018-09-29 ENCOUNTER — Ambulatory Visit (INDEPENDENT_AMBULATORY_CARE_PROVIDER_SITE_OTHER): Payer: Medicare Other | Admitting: Physician Assistant

## 2018-09-29 ENCOUNTER — Other Ambulatory Visit (INDEPENDENT_AMBULATORY_CARE_PROVIDER_SITE_OTHER): Payer: Medicare Other

## 2018-09-29 ENCOUNTER — Other Ambulatory Visit: Payer: Self-pay

## 2018-09-29 ENCOUNTER — Ambulatory Visit (INDEPENDENT_AMBULATORY_CARE_PROVIDER_SITE_OTHER)
Admission: RE | Admit: 2018-09-29 | Discharge: 2018-09-29 | Disposition: A | Discharge status: Home / Self Care | Payer: Medicare Other | Source: Ambulatory Visit | Attending: Physician Assistant | Admitting: Physician Assistant

## 2018-09-29 ENCOUNTER — Encounter: Payer: Self-pay | Admitting: Physician Assistant

## 2018-09-29 VITALS — BP 98/52 | HR 96 | Temp 79.6°F | Ht 72.75 in | Wt 154.4 lb

## 2018-09-29 DIAGNOSIS — R1084 Generalized abdominal pain: Secondary | ICD-10-CM | POA: Diagnosis not present

## 2018-09-29 DIAGNOSIS — R634 Abnormal weight loss: Secondary | ICD-10-CM | POA: Diagnosis not present

## 2018-09-29 DIAGNOSIS — K5909 Other constipation: Secondary | ICD-10-CM

## 2018-09-29 DIAGNOSIS — G20 Parkinson's disease: Secondary | ICD-10-CM

## 2018-09-29 DIAGNOSIS — R6881 Early satiety: Secondary | ICD-10-CM

## 2018-09-29 LAB — COMPREHENSIVE METABOLIC PANEL
ALT: 31 U/L (ref 0–53)
AST: 39 U/L — ABNORMAL HIGH (ref 0–37)
Albumin: 4.1 g/dL (ref 3.5–5.2)
Alkaline Phosphatase: 38 U/L — ABNORMAL LOW (ref 39–117)
BUN: 23 mg/dL (ref 6–23)
CO2: 33 mEq/L — ABNORMAL HIGH (ref 19–32)
Calcium: 9.6 mg/dL (ref 8.4–10.5)
Chloride: 104 mEq/L (ref 96–112)
Creatinine, Ser: 0.78 mg/dL (ref 0.40–1.50)
GFR: 95.18 mL/min (ref >60.00)
Glucose, Bld: 97 mg/dL (ref 70–99)
Potassium: 3.3 mEq/L — ABNORMAL LOW (ref 3.5–5.1)
Sodium: 143 mEq/L (ref 135–145)
Total Bilirubin: 0.7 mg/dL (ref 0.2–1.2)
Total Protein: 6.5 g/dL (ref 6.0–8.3)

## 2018-09-29 LAB — CBC WITH DIFFERENTIAL/PLATELET
Basophils Absolute: 0 10*3/uL (ref 0.0–0.1)
Basophils Relative: 0.9 % (ref 0.0–3.0)
Eosinophils Absolute: 0.1 10*3/uL (ref 0.0–0.7)
Eosinophils Relative: 2 % (ref 0.0–5.0)
HCT: 42.1 % (ref 39.0–52.0)
Hemoglobin: 14.2 g/dL (ref 13.0–17.0)
Lymphocytes Relative: 30.1 % (ref 12.0–46.0)
Lymphs Abs: 1.3 10*3/uL (ref 0.7–4.0)
MCHC: 33.8 g/dL (ref 30.0–36.0)
MCV: 92.1 fl (ref 78.0–100.0)
Monocytes Absolute: 0.5 10*3/uL (ref 0.1–1.0)
Monocytes Relative: 11.8 % (ref 3.0–12.0)
Neutro Abs: 2.4 10*3/uL (ref 1.4–7.7)
Neutrophils Relative %: 55.2 % (ref 43.0–77.0)
Platelets: 195 10*3/uL (ref 150.0–400.0)
RBC: 4.57 Mil/uL (ref 4.22–5.81)
RDW: 15.1 % (ref 11.5–15.5)
WBC: 4.4 10*3/uL (ref 4.0–10.5)

## 2018-09-29 LAB — LIPASE: Lipase: 52 U/L (ref 11.0–59.0)

## 2018-09-29 NOTE — Progress Notes (Addendum)
Subjective:    Patient ID: Stephen Gentry, male    DOB: 1934-12-06, 84 y.o.   MRN: [id number]  HPI Davaughn is a pleasant 84 year old white male, known to Dr. Hilarie Fredrickson with history of chronic slow transit constipation in setting of advanced Parkinson's disease.  He also has history of atrial flutter, hypotension for which he is now on midodrine  and Florinef. He has history of sigmoid volvulus with previous resection. He was last seen in the office in November 2019 and at that time was stable.  They were using Linzess 290 mcg daily in addition to as needed MiraLAX and enemas. Patient comes in today accompanied by his wife who states he has not been doing well over the past several months and has had a progressive decline.  She is very concerned because he has had a significant weight loss of about 30 pounds over the past 4 to 5 months.  He has not been eating very much, has no appetite and says he feels full and uncomfortable.  He is also been complaining of abdominal pain on an almost daily basis she says is across his mid abdomen.  He describes this is sort of a pressure sensation "like I am going to explode".  He has pain frequently seems to be relieved by a bowel movement or an enema.  At times he will have significant improvement for the remainder of the day and sometimes just for an hour or 2. His wife reports they have not been using Linzess for several months as it did not seem to offer any benefit.  She has been giving him MiraLAX twice daily, and has been using a fleets enema every 3 to 4 days as needed.  Whenever he is having complaints of pain and has not had a bowel movement they will do an enema. This past weekend he did not have any significant results of stool even with enema and she gave him to half bottles of mag citrate with no results and then finally gave him a whole bottle of mag citrate followed by a couple of explosive bowel movements but has not had any further stool over the past couple  of days. Patient's wife mentions that he has just recently been established with hospice.  She says she is having an increased difficulty caring for him because he has become very weak and is not able to ambulate by himself.  She is distressed because he will not eat.  She has been using boost but is unable to get much of this in him each day.  I questioned whether a PEG tube had been discussed to assist with nutrition.  She says he has a living well and did not ever want to have a feeding tube.  Appendectomy had an ER visit and aspirin within the past 2 weeks, I do not have results of that visit.  She thinks he had regular abdominal films done but was not sent home with any sort of a bowel purge or different instructions. He had also been on Megace for a couple of months without any improvement in symptoms, this is been stopped and she is wondering if there is any alternative  Review of Systems Pertinent positive and negative review of systems were noted in the above HPI section.  All other review of systems was otherwise negative.  Outpatient Encounter Medications as of 09/29/2018  Medication Sig  . B Complex-C (SUPER B COMPLEX PO) Take 1 tablet by mouth daily.  [name]  carbidopa-levodopa (SINEMET CR) 50-200 MG tablet Take 1 tablet by mouth 3 (three) times daily.  . carbidopa-levodopa (SINEMET IR) 25-100 MG tablet Take 1 tablet by mouth at bedtime. 1am  . Cholecalciferol 50 MCG (2000 UT) CAPS Take 2,000 Units by mouth daily.   . cyanocobalamin (,VITAMIN B-12,) 1000 MCG/ML injection Inject 1,000 mcg into the muscle every 30 (thirty) days.  [name] ELIQUIS 5 MG TABS tablet TAKE 1 TABLET BY MOUTH TWICE A DAY  . escitalopram (LEXAPRO) 20 MG tablet TAKE 1 TABLET BY MOUTH EVERY DAY  . fludrocortisone (FLORINEF) 0.1 MG tablet Take 1 tablet (0.1 mg total) by mouth daily.  . midodrine (PROAMATINE) 10 MG tablet Take 1 tablet (10 mg total) by mouth 3 (three) times daily.  . polyethylene glycol (MIRALAX / GLYCOLAX) 17  g packet Take 17 g by mouth 2 (two) times daily.  . QUEtiapine (SEROQUEL) 25 MG tablet Take 1 tablet (25 mg total) by mouth 2 (two) times daily.  . megestrol (MEGACE) 40 MG/ML suspension Take 20 mLs by mouth daily.  . [DISCONTINUED] Carbidopa-Levodopa ER (SINEMET CR) 25-100 MG tablet controlled release 2 tabs tid, 1 at bed, 1 at 2 am   No facility-administered encounter medications on file as of 09/29/2018.    Allergies  Allergen Reactions  . Lorazepam Other (See Comments)    Combative, foul language. Wife doesn't want patient to have again  . Tetracyclines & Related Anaphylaxis  . Amitiza [Lubiprostone] Nausea Only   Patient Active Problem List   Diagnosis Date Noted  . Weight loss 05/17/2018  . Chronic anticoagulation 05/17/2018  . Sigmoid volvulus ([place]) 04/26/2018  . Atrial flutter ([place]) 01/23/2018  . Orthostatic hypotension 01/22/2018  . Constipation 09/25/2016  . Anxiety 04/24/2016  . 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 Vitamin deficiency 02/19/2015  . Memory loss 02/19/2015  . Vitamin D deficiency 02/19/2015  . Other fatigue 02/19/2015  . Mild depression ([place]) 08/18/2014  . Disturbed cognition 08/18/2014  . Snoring 08/18/2014  . Idiopathic Parkinson's disease ([place]) 02/07/2014  . Parkinson's disease ([place]) 02/07/2014   Social History   Socioeconomic History  . Marital status: Married    Spouse name: [name]  . Number of children: 3  . Years of education: Not on file  . Highest education level: Not on file  Occupational History  . Occupation: retired    Comment: Counsellor  . 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 Financial resource strain: Not on file  . Food insecurity    Worry: Not on file    Inability: Not on file  . Transportation needs    Medical: Not on file    Non-medical: Not on file  Tobacco Use  . Smoking status: Former Smoker    Years: 3.00    Types: Cigarettes    Quit date: 10/23/1956     Years since quitting: 61.9  . Smokeless tobacco: Never Used  Substance and Sexual Activity  . Alcohol use: Not Currently    Alcohol/week: 0.0 standard drinks  . Drug use: Never  . Sexual activity: Not on file  Lifestyle  . Physical activity    Days per week: Not on file    Minutes per session: Not on file  . Stress: Not on file  Relationships  . Social Herbalist on phone: Not on file    Gets together: Not on file    Attends religious service: Not on file    Active  member of club or organization: Not on file    Attends meetings of clubs or organizations: Not on file    Relationship status: Not on file  . Intimate partner violence    Fear of current or ex partner: Not on file    Emotionally abused: Not on file    Physically abused: Not on file    Forced sexual activity: Not on file  Other Topics Concern  . Not on file  Social History Narrative  . Not on file    Mr. Maines family history includes Lung cancer in his father and mother.      Objective:    Vitals:   09/29/18 0954  BP: (!) 98/52  Pulse: 96  Temp: (!) 79.6 F (26.4 C)    Physical Exam Well-developed thin, chronically ill appearing elderly white male accompanied by his wife.  Patient with advanced Parkinson's .  Height, 6 foot weight, 154 BMI 20.5  HEENT; nontraumatic normocephalic, EOMI, PER R LA, sclera anicteric.  Parkinson's facies Oropharynx; not examined/wearing mask/COVID Neck; supple, no JVD Cardiovascular; ir regular rate and rhythm with S1-S2, no murmur rub or gallop Pulmonary; Clear bilaterally Abdomen; soft, , nondistended, nontympanitic, is very mildly tender in the hypogastrium and midabdomen, no guarding or rebound no palpable mass or hepatosplenomegaly, bowel sounds are active Rectal; not done today Skin; benign exam, no jaundice rash or appreciable lesions Extremities; no clubbing cyanosis or edema skin warm and dry Neuro/Psych; alert and oriented x4, advanced Parkinson's  features, tremor and pill-rolling, slow but appropriate speech       Assessment & Plan:   #93 84 year old white male with advanced Parkinson's who has had a significant decline in status over the past 4 to 5 months with poor appetite and 25 pound weight loss, increased debilitation and currently unable to ambulate without assistance.  #2 progressive chronic constipation, and complaints of mid abdominal pain on a daily basis which at times can be temporarily relieved after a bowel movement or an enema. Patient complains of a sensation of fullness and pressure. Etiology of his abdominal pain is not clear, doubt this is all from constipation but cannot rule out.  Rule out low-grade partial obstruction. Review of CT scan done from March 2020 did show a single stone in the neck of the gallbladder-rule out biliary colic  #3 history of sigmoid volvulus status post sigmoid resection #4 malnutrition secondary to above #5 history of atrial flutter #6 chronic hypotension related to Parkinson's, requiring midodrine  and Florinef  Plan; Check plain abdominal films today, if no evidence of obstruction he is instructed to do a bowel purge with Suprep.  - they were given a Suprep sample today  Schedule for upper abdominal ultrasound CBC, c-Met and lipase Continue MiraLAX 17 g in 8 ounces of water twice daily. Start Senokot 2 p.o. every morning Advised  patient's wife that it was perfectly acceptable to do a fleets enema every morning if he gets relief from this or every other day as needed. She will work on very small frequent feedings, continue attempts at boost and also advised a try resource breeze as an additional protein supplement. He may also have a component of gastroparesis, we discussed this briefly.  I am not sure the gastric emptying scan would change our management.  Burnard Enis S Mikalia Fessel PA-C 09/29/2018   Cc: Raina Mina., MD  Addendum: Reviewed and agree with assessment and management  plan. Pyrtle, Lajuan Lines, MD

## 2018-09-29 NOTE — Patient Instructions (Signed)
If you are age 82 or older, your body mass index should be between 23-30. Your Body mass index is 20.51 kg/m. If this is out of the aforementioned range listed, please consider follow up with your Primary Care Provider.  If you are age 54 or younger, your body mass index should be between 19-25. Your Body mass index is 20.51 kg/m. If this is out of the aformentioned range listed, please consider follow up with your Primary Care Provider.   Your provider has requested that you go to the basement level for lab work and Abdominal X-ray before leaving today. Press "B" on the elevator. The lab is located at the first door on the left as you exit the elevator.  You have been scheduled for an abdominal ultrasound at [facility] (1st floor of hospital) on 10/01/18 at 10:30 am. Please arrive 15 minutes prior to your appointment for registration. Make certain not to have anything to eat or drink after midnight the day prior to your appointment. Should you need to reschedule your appointment, please contact radiology at [phone]. This test typically takes about 30 minutes to perform.  You have been given Suprep Bowel Prep Kit for Bowel Purge.  Follow instructions on the box.    Continue Miralax 17 gm twice daily.  Start Senokot (over-the-counter) 2 every morning.  Okay to use Fleets enema every day.  Try Resource [employer] Supplement - 1/2 can at a time.  Small frequent feedings.  We will call you with results.  Thank you for choosing me and [facility].   Amy Esterwood, PA-C

## 2018-09-30 ENCOUNTER — Other Ambulatory Visit: Payer: Self-pay

## 2018-09-30 MED ORDER — POTASSIUM CHLORIDE CRYS ER 20 MEQ PO TBCR: 20.0000 meq | EXTENDED_RELEASE_TABLET | Freq: Two times a day (BID) | ORAL | 0 refills | Status: DC

## 2018-10-01 ENCOUNTER — Ambulatory Visit (HOSPITAL_COMMUNITY): Payer: Medicare Other

## 2018-10-01 ENCOUNTER — Other Ambulatory Visit: Payer: Self-pay | Admitting: Psychiatry

## 2018-10-04 ENCOUNTER — Other Ambulatory Visit: Payer: Self-pay

## 2018-10-04 ENCOUNTER — Ambulatory Visit (HOSPITAL_COMMUNITY)
Admission: RE | Admit: 2018-10-04 | Discharge: 2018-10-04 | Disposition: A | Discharge status: Home / Self Care | Source: Ambulatory Visit | Attending: Physician Assistant | Admitting: Physician Assistant

## 2018-10-04 DIAGNOSIS — G20 Parkinson's disease: Secondary | ICD-10-CM | POA: Diagnosis not present

## 2018-10-04 DIAGNOSIS — R6881 Early satiety: Secondary | ICD-10-CM | POA: Diagnosis present

## 2018-10-04 DIAGNOSIS — R634 Abnormal weight loss: Secondary | ICD-10-CM | POA: Insufficient documentation

## 2018-10-04 DIAGNOSIS — K5909 Other constipation: Secondary | ICD-10-CM | POA: Diagnosis present

## 2018-10-04 DIAGNOSIS — R1084 Generalized abdominal pain: Secondary | ICD-10-CM | POA: Diagnosis present

## 2018-10-05 ENCOUNTER — Telehealth: Payer: Self-pay

## 2018-10-05 NOTE — Telephone Encounter (Signed)
Stephen Gentry - this is your pt with advanced Parkinsons, autonomic dysfunction , terrible constipation - Still c/o abd discomfort after eating , losing weight , feels full all the time - prob has gastroparesis  Component to sxs -- don"t want to use Reglan - also Megace not helping appetite- with desperate , even though he is transitioning to hospice -  Anything else you can think of to help appetite , and gastroparetic sxs

## 2018-10-05 NOTE — Telephone Encounter (Signed)
Spouse states the patient had good results with the bowel purge this weekend.  He does continue to complain of belly pain. Occurs with eating. He is taking Senna and Miralax as recommended. Has a hard time with the Miralax. Patient feels "too full" and he is afraid the quantity will make him vomit. However he is compliant with drinking it. Reviewed diet. Other than ice cream his diet is low-fat.

## 2018-10-05 NOTE — Telephone Encounter (Signed)
-----   Message from Alfredia Ferguson, PA-C sent at 10/04/2018  4:24 PM EDT ----- Please let pt and wife know that the [place] looks good - he does have a gallstone in the [place] , read as mobile- no GB wall thickening or findings to suggest cholecystitis- see how he is doing with abdominal  Pain, and bowels- they did a bowel purge late last week

## 2018-10-06 ENCOUNTER — Telehealth: Payer: Self-pay | Admitting: Physician Assistant

## 2018-10-06 MED ORDER — HYOSCYAMINE SULFATE SL 0.125 MG SL SUBL: SUBLINGUAL_TABLET | SUBLINGUAL | 1 refills | Status: DC

## 2018-10-06 NOTE — Telephone Encounter (Signed)
Patient's wife that he is having constant abdominal pain.  She states a 7 out of 10.  She gave him an enema and he had slight relief, with a large BM.  The pain is about the level of the naval and nothing has improved the pain.  Wife is wondering what the next steps are to diagnosis or relieve pain.

## 2018-10-06 NOTE — Telephone Encounter (Signed)
Patient wife called and requested to speak to the nurse for some advice. She stated that he did not sleep well last night with chronic pain level 7 and he also can not eat. She is needing advice from the nurse.

## 2018-10-06 NOTE — Telephone Encounter (Signed)
Attempted to reach the patient, no answer/ machine

## 2018-10-06 NOTE — Telephone Encounter (Signed)
Amy Esterwood, PA reviewed the notes and has advised to try levsin.  Patient's wife  notified of Amy Esterwood PA's orders for levsin 0.125 1-2  q 6 hours prn abdominal pain.They will call back if this does not help to consider tramadol rx

## 2018-10-06 NOTE — Telephone Encounter (Signed)
Difficult situation, I think his constipation and very poor colonic transit is exacerbating nausea but also pain If pain is severe than the ER may be our best option Certainly okay to try a bowel purge today at home with MiraLAX prep; 255 g of MiraLAX dissolved in 64 ounces of Gatorade drink over 2 to 4 hours If vomiting or worsening pain with prep should go to the ER He is doing daily enemas but we also need to be stimulating bowel movements orally with either MiraLAX 2-3 times a day or the addition of a prescription laxative. If able to remain out of the ER I am inclined to repeat a CT scan of the abdomen and pelvis

## 2018-10-06 NOTE — Telephone Encounter (Signed)
Certainly need to work on getting the bowels to empty on a more regular basis.  Some of the pain may be related to autonomic dysfunction visceral sensory pain/dysfunction in the setting of his Parkinson's  If bowels are emptying and regular and the pain persists and there is no finding on CT to explain then this would likely be more of a functional/autonomic sensory dysfunction.  Would also ask Dr. Carles Collet if she has any options for visceral type pain or even his hospice nurse

## 2018-10-06 NOTE — Telephone Encounter (Signed)
Patient's wife notified of Dr. Vena Rua recommendations She is giving him senna and Miralax daily.   Patient has transferred to [facility] [place] at [facility]. Notes faxed to [phone].

## 2018-10-13 ENCOUNTER — Other Ambulatory Visit: Payer: Self-pay | Admitting: Physician Assistant

## 2018-10-26 ENCOUNTER — Other Ambulatory Visit: Payer: Self-pay | Admitting: Neurology

## 2018-11-04 ENCOUNTER — Telehealth: Payer: Self-pay | Admitting: Internal Medicine

## 2018-11-04 NOTE — Telephone Encounter (Signed)
Pt is constipated. Wife gave pt an enema and the enema came out, not much stool came out. Reports he continues to dribble out liquid. WIfe states Sunday she gave him an enema and the same thing happened and they call the md on call. Spoke with Dr.Mann and was instructed to repeat the enema. Reports then he passed a little stool and he passed another small amount on Tuesday. Wife states she just repeated the enema but he has not passed much but the enema. Wife wants to know what she should do, pt is wanting her to take him to the er. She does not really want to take him there. Please advise.

## 2018-11-05 NOTE — Telephone Encounter (Signed)
Spoke with pts wife and pt has not been doing the miralax, he has been taking the senokot. Discussed with wife and let her know Dr. Hilarie Fredrickson wants him to start doing the miralax BID everyday and use the enemas as needed. Wife instructed to have the patient do a miralax purge with 8 doses of miralax mixed in 32 oz gatorade and drink 1 cup every 29min until gone. She verbalized understanding.

## 2018-11-05 NOTE — Telephone Encounter (Signed)
Pt's wife reported that pt is still experiencing the same symptoms.

## 2018-11-08 ENCOUNTER — Other Ambulatory Visit: Payer: Self-pay

## 2018-11-08 ENCOUNTER — Ambulatory Visit (INDEPENDENT_AMBULATORY_CARE_PROVIDER_SITE_OTHER): Admitting: Psychiatry

## 2018-11-08 ENCOUNTER — Encounter: Payer: Self-pay | Admitting: Psychiatry

## 2018-11-08 DIAGNOSIS — F320 Major depressive disorder, single episode, mild: Secondary | ICD-10-CM

## 2018-11-08 DIAGNOSIS — F0280 Dementia in other diseases classified elsewhere without behavioral disturbance: Secondary | ICD-10-CM

## 2018-11-08 DIAGNOSIS — G20 Parkinson's disease: Secondary | ICD-10-CM

## 2018-11-08 DIAGNOSIS — F32A Depression, unspecified: Secondary | ICD-10-CM

## 2018-11-08 MED ORDER — MIRTAZAPINE 15 MG PO TABS: 15.0000 mg | ORAL_TABLET | Freq: Every day | ORAL | 0 refills | Status: DC

## 2018-11-08 NOTE — Progress Notes (Signed)
Stephen Gentry [id number] December 23, 1934 84 y.o.  Subjective:   Patient ID:  Stephen Gentry is a 84 y.o. (DOB 03-12-1934) male.  Chief Complaint:  Chief Complaint  Patient presents with  . Follow-up    Medication Management  . Depression    Medication Management  . Memory Loss  . 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  Seen with wife Stephen Gentry.  His first visit was September 06, 2018.  For the symptoms of anxiety, dread and obsessing over dying associated with some psychotic symptoms we altered dosing of quetiapine to half of a 25 mg tablet twice daily for 4 to 5 days and if not effective increase to 1 tablet twice daily if needed.  He was to continue on the Lexapro 20 mg daily but if it failed to adequately manage his anxiety there was consideration of switching to paroxetine although there are greater drug interaction issues with that medicine.  Not real good bc PD makes it difficult to do things.  Wonders about tthe future.  Depressed bc nature  Of the disease.  Abd pain off and on but seems more settled.  Sleep pretty good. Staying up until 9 and then wonders if he took pills and get OOB.  Sleeps about 12 hours.  They don't sleep together bc of dogs.   Appetite not great.  Takes a long time to eat.  Wife upset he won't eat and is losing weight and strength.    Per wife less pacing.  Had a pretty good drive yesterday and then felt bad for no reason.  Mostly taking quetiapine 25 twice daily for 50% time but doesn't seem to do much for his agitation.  Good interests. Fewer comments or thoughts of death.   Past Psychiatric Medication Trials: On Lexapro for nearly 3 years.  Started quetiapine 25 mg May 2020, sertraline 50 less effective, Abilify for 2 weeks without response.  Review of Systems:  Review of Systems  Constitutional: Positive for fatigue.  Gastrointestinal:  Positive for abdominal pain. Negative for constipation.  Neurological: Positive for tremors and weakness.       Shuffling gait and balance problems without change  Psychiatric/Behavioral: Positive for decreased concentration. Negative for hallucinations.    Medications: I have reviewed the patient's current medications.  Current Outpatient Medications  Medication Sig Dispense Refill  . B Complex-C (SUPER B COMPLEX PO) Take 1 tablet by mouth daily.    . carbidopa-levodopa (SINEMET IR) 25-100 MG tablet Take 8 tablets by mouth at bedtime. 1am     . Cholecalciferol 50 MCG (2000 UT) CAPS Take 2,000 Units by mouth daily.     . cyanocobalamin (,VITAMIN B-12,) 1000 MCG/ML injection Inject 1,000 mcg into the muscle every 30 (thirty) days.    [name] ELIQUIS 5 MG TABS tablet TAKE 1 TABLET BY MOUTH TWICE A DAY 60 tablet 6  . escitalopram (LEXAPRO) 20 MG tablet TAKE 1 TABLET BY MOUTH EVERY DAY 90 tablet 1  . fludrocortisone (FLORINEF) 0.1 MG tablet Take 1 tablet (0.1 mg total) by mouth daily. 90 tablet 3  . midodrine (PROAMATINE) 10 MG tablet Take 1 tablet (10 mg total) by mouth 3 (three) times daily.    . polyethylene glycol (MIRALAX / GLYCOLAX) 17 g packet Take 17 g by mouth 2 (two) times daily.    . QUEtiapine (SEROQUEL) 25 MG tablet TAKE 1 TABLET BY  MOUTH TWICE A DAY 180 tablet 0  . senna (SENOKOT) 8.6 MG TABS tablet Take 2 tablets by mouth daily.    . mirtazapine (REMERON) 15 MG tablet Take 1 tablet (15 mg total) by mouth at bedtime. 90 tablet 0   No current facility-administered medications for this visit.     Medication Side Effects: None  Allergies:  Allergies  Allergen Reactions  . Lorazepam Other (See Comments)    Combative, foul language. Wife doesn't want patient to have again  . Tetracyclines & Related Anaphylaxis  . Amitiza [Lubiprostone] Nausea Only    Past Medical History:  Diagnosis Date  . Alzheimer's dementia (HCC)   . Anemia   . Anticoagulant long-term use    xarelto  .  Anxiety   . Arthritis   . Autonomic dysfunction   . Baker's cyst    right knee  . Chronic constipation   . Diverticulosis   . Internal hemorrhoids   . Nocturia   . Numbness and tingling in right hand    since tablesaw injury 2002  . Numbness and tingling of both feet    toes  . Orthostatic hypotension   . PAF (paroxysmal atrial fibrillation) [facility])    cardiologist-- dr Graciela Husbands  . Parkinson's disease [facility])    neurologist-- dr tat  (idiopathic,  notes in epic)  . Tremor, essential   . Wears glasses     Family History  Problem Relation Age of Onset  . Lung cancer Mother   . Lung cancer Father   . Colon cancer Neg Hx   . Esophageal cancer Neg Hx   . Pancreatic cancer Neg Hx   . Stomach cancer Neg Hx   . Liver disease Neg Hx   . Rectal cancer Neg Hx     Social History   Socioeconomic History  . Marital status: Married    Spouse name: [name]  . Number of children: 3  . Years of education: Not on file  . Highest education level: Not on file  Occupational History  . Occupation: retired    Comment: Public house manager  . 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 Financial resource strain: Not on file  . Food insecurity    Worry: Not on file    Inability: Not on file  . Transportation needs    Medical: Not on file    Non-medical: Not on file  Tobacco Use  . Smoking status: Former Smoker    Years: 3.00    Types: Cigarettes    Quit date: 10/23/1956    Years since quitting: 62.0  . Smokeless tobacco: Never Used  Substance and Sexual Activity  . Alcohol use: Not Currently    Alcohol/week: 0.0 standard drinks  . Drug use: Never  . Sexual activity: Not on file  Lifestyle  . Physical activity    Days per week: Not on file    Minutes per session: Not on file  . Stress: Not on file  Relationships  . Social Musician on phone: Not on file    Gets together: Not on file    Attends religious service: Not on file    Active  member of club or organization: Not on file    Attends meetings of clubs or organizations: Not on file    Relationship status: Not on file  . Intimate partner violence    Fear of current or ex partner: Not on file    Emotionally  abused: Not on file    Physically abused: Not on file    Forced sexual activity: Not on file  Other Topics Concern  . Not on file  Social History Narrative  . Not on file    Past Medical History, Surgical history, Social history, and Family history were reviewed and updated as appropriate.   Please see review of systems for further details on the patient's review from today.   Objective:   Physical Exam:  There were no vitals taken for this visit.  Physical Exam Constitutional:      Appearance: He is ill-appearing.  Neurological:     Mental Status: He is alert.     Motor: Weakness present.     Gait: Gait abnormal.     Comments: Shuffling gait and balance problems without change  Psychiatric:        Attention and Perception: He does not perceive auditory or visual hallucinations.        Mood and Affect: Mood is anxious and depressed.        Speech: Speech is delayed. Speech is not slurred.        Behavior: Behavior is slowed. Behavior is cooperative.        Thought Content: Thought content is not paranoid or delusional. Thought content does not include homicidal or suicidal ideation.        Cognition and Memory: Cognition is impaired. Memory is impaired.     Comments: Attention is fair.  His wife is concerned about his level of anxiety and depression he rates it in the mild range.  He does not recall my name that he knows he should recall it because he was told before he came.  He is not oriented to this specific date.  He is oriented to the situation. Insight and judgment are fair and limited because of cognitive impairment     Lab Review:     Component Value Date/Time   NA 143 09/29/2018 1133   NA 137 05/17/2018 1417   K 3.3 (L) 09/29/2018  1133   CL 104 09/29/2018 1133   CO2 33 (H) 09/29/2018 1133   GLUCOSE 97 09/29/2018 1133   BUN 23 09/29/2018 1133   BUN 14 05/17/2018 1417   CREATININE 0.78 09/29/2018 1133   CREATININE 0.82 08/06/2017 1554   CALCIUM 9.6 09/29/2018 1133   PROT 6.5 09/29/2018 1133   ALBUMIN 4.1 09/29/2018 1133   AST 39 (H) 09/29/2018 1133   ALT 31 09/29/2018 1133   ALKPHOS 38 (L) 09/29/2018 1133   BILITOT 0.7 09/29/2018 1133   GFRNONAA >60 07/29/2018 1903   GFRAA >60 07/29/2018 1903       Component Value Date/Time   WBC 4.4 09/29/2018 1133   RBC 4.57 09/29/2018 1133   HGB 14.2 09/29/2018 1133   HCT 42.1 09/29/2018 1133   PLT 195.0 09/29/2018 1133   MCV 92.1 09/29/2018 1133   MCH 30.0 07/29/2018 1903   MCHC 33.8 09/29/2018 1133   RDW 15.1 09/29/2018 1133   LYMPHSABS 1.3 09/29/2018 1133   MONOABS 0.5 09/29/2018 1133   EOSABS 0.1 09/29/2018 1133   BASOSABS 0.0 09/29/2018 1133    No results found for: POCLITH, LITHIUM   No results found for: PHENYTOIN, PHENOBARB, VALPROATE, CBMZ   .res Assessment: Plan:    Stephen Gentry was seen today for follow-up, depression, memory loss and anxiety.  Diagnoses and all orders for this visit:  Mild depression (HCC) -     mirtazapine (REMERON) 15 MG tablet; Take  1 tablet (15 mg total) by mouth at bedtime.  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  He still has low appetite and significant depression and residual anxiety.  Wife is wondering whether is anything else that could be done to help with these symptoms.  We discussed the option of increasing quetiapine as it can help with all of these symptoms however we want to keep the dosage at a minimum because of his Parkinson's disease.  The next best option would be mirtazapine.  We could consider switching Lexapro to paroxetine but this would have more  drug interaction issues.  The mirtazapine should help with his appetite and could help with depression.  Discussed the inverse relationship with sedation with mirtazapine should he be too sedated at the low dose we will increase the dose.  She will call if this is a problem.  For now continue Lexapro 20 mg daily  Continue quetiapine as currently taking between 25 and 50 mg daily.  He is tolerating it well.  We discussed side effects including the potential for worsening Parkinson's symptoms.  Discussed potential metabolic side effects associated with atypical antipsychotics, as well as potential risk for movement side effects. Advised pt to contact office if movement side effects occur.   Add mirtazapine 15 mg nightly as noted.  This appt was 30 mins.  Greater than 50% of face to face time with patient was spent on counseling and coordination of care.  Follow-up in 8 weeks  Meredith Staggersarey Cottle, MD, DFAPA   Please see After Visit Summary for patient specific instructions.  Future Appointments  Date Time Provider Department Center  01/05/2019  2:00 PM Cottle, Steva Readyarey G Jr., MD [facility] None    No orders of the defined types were placed in this encounter.   -------------------------------

## 2018-11-30 ENCOUNTER — Ambulatory Visit: Payer: Medicare Other | Admitting: Neurology

## 2018-12-14 ENCOUNTER — Telehealth: Payer: Self-pay | Admitting: Internal Medicine

## 2018-12-14 NOTE — Telephone Encounter (Signed)
I spoke to the patient's wife who is calling to report that the patient continues to have orthostatic BP issues.  He goes to the gym 3x a week, but experiences episodes of dizziness.  Back in April during a Televisit, Dr Caryl Comes wanted a f/u appointment in 2 weeks with standing Bps.  I will forward to schedule an OV.

## 2018-12-14 NOTE — Telephone Encounter (Signed)
New message:    Patient wife calling stating that he need to see Dr. Caryl Comes soon patient is getting dizzy. She also would like to know about some medication changes. Patient wife would a appt soon. Please call patient wife.

## 2019-01-02 ENCOUNTER — Other Ambulatory Visit: Payer: Self-pay | Admitting: Psychiatry

## 2019-01-03 ENCOUNTER — Telehealth (INDEPENDENT_AMBULATORY_CARE_PROVIDER_SITE_OTHER): Payer: Medicare Other | Admitting: Internal Medicine

## 2019-01-03 ENCOUNTER — Other Ambulatory Visit: Payer: Self-pay

## 2019-01-03 ENCOUNTER — Encounter: Payer: Self-pay | Admitting: Internal Medicine

## 2019-01-03 VITALS — BP 108/65 | HR 70 | Wt 164.0 lb

## 2019-01-03 DIAGNOSIS — I951 Orthostatic hypotension: Secondary | ICD-10-CM

## 2019-01-03 DIAGNOSIS — G20 Parkinson's disease: Secondary | ICD-10-CM

## 2019-01-03 DIAGNOSIS — I480 Paroxysmal atrial fibrillation: Secondary | ICD-10-CM | POA: Diagnosis not present

## 2019-01-03 NOTE — Patient Instructions (Signed)
Medication Instructions:   Take Abilify every other day  Labwork: Your physician recommends that you return for lab work this week for a BMP   Testing/Procedures: None ordered.  Follow-Up: Your physician recommends that you schedule a follow-up appointment in: 6 weeks for a virtual appointment.   Any Other Special Instructions Will Be Listed Below (If Applicable).     If you need a refill on your cardiac medications before your next appointment, please call your pharmacy.
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
## 2019-01-05 ENCOUNTER — Other Ambulatory Visit: Payer: Medicare Other | Admitting: *Deleted

## 2019-01-05 ENCOUNTER — Encounter: Payer: Self-pay | Admitting: Psychiatry

## 2019-01-05 ENCOUNTER — Encounter (INDEPENDENT_AMBULATORY_CARE_PROVIDER_SITE_OTHER): Payer: Self-pay

## 2019-01-05 ENCOUNTER — Ambulatory Visit (INDEPENDENT_AMBULATORY_CARE_PROVIDER_SITE_OTHER): Payer: Medicare Other | Admitting: Psychiatry

## 2019-01-05 ENCOUNTER — Other Ambulatory Visit: Payer: Self-pay

## 2019-01-05 DIAGNOSIS — F32A Depression, unspecified: Secondary | ICD-10-CM

## 2019-01-05 DIAGNOSIS — G20 Parkinson's disease: Secondary | ICD-10-CM | POA: Diagnosis not present

## 2019-01-05 DIAGNOSIS — F320 Major depressive disorder, single episode, mild: Secondary | ICD-10-CM | POA: Diagnosis not present

## 2019-01-05 DIAGNOSIS — F0280 Dementia in other diseases classified elsewhere without behavioral disturbance: Secondary | ICD-10-CM | POA: Diagnosis not present

## 2019-01-05 DIAGNOSIS — I951 Orthostatic hypotension: Secondary | ICD-10-CM

## 2019-01-05 DIAGNOSIS — I480 Paroxysmal atrial fibrillation: Secondary | ICD-10-CM

## 2019-01-05 NOTE — Progress Notes (Signed)
Stephen Gentry [id number] 09-29-1934 84 y.o.  Subjective:   Patient ID:  Stephen Gentry is a 84 y.o. (DOB 1934/08/31) male.  Chief Complaint:  Chief Complaint  Patient presents with  . Follow-up    Medication Management  . Depression    Medication Management  . 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  Seen with wife [name].  His first visit was September 06, 2018.  For the symptoms of anxiety, dread and obsessing over dying associated with some psychotic symptoms we altered dosing of quetiapine to half of a 25 mg tablet twice daily for 4 to 5 days and if not effective increase to 1 tablet twice daily if needed.  He was to continue on the Lexapro 20 mg daily but if it failed to adequately manage his anxiety there was consideration of switching to paroxetine although there are greater drug interaction issues with that medicine.  Last visit was November 08, 2018.  Lexapro 20 mg was continued as was quetiapine between 25 and 50 mg daily.  Mirtazapine 15 mg was added.  Per wife mirtazapine 15 mg with a much improved appetite and weight.  "Wonder drug". Less bloating with Colace and Senna.  Chronic weak and unsteady and started back with [employer].  Gets dizzy.  After exercise BP drops with resulting afib.  Pending appt with Dr. Caryl Comes cardiology.  They have been in contact.  Low BP causes sx.  Not real good bc PD makes it difficult to do things.  Wonders about tthe future.  Depressed bc nature  Of the disease.  Abd pain off and on but seems more settled.  Sleep pretty good. Staying up until 9 and then wonders if he took pills and get OOB.  Sleeps about 12 hours.  They don't sleep together bc of dogs. No much anxiety and no longer pacing.  Not asking  to go to the ER like before meds.       Past Psychiatric Medication Trials: On Lexapro for nearly 3 years.  Started quetiapine 25 mg May 2020, sertraline 50 less effective, Abilify for 2 weeks without response.  Review of Systems:  Review of Systems  Constitutional: Positive for fatigue.  Gastrointestinal: Negative for abdominal pain and constipation.  Neurological: Positive for tremors and weakness.       Shuffling gait and balance problems without change  Psychiatric/Behavioral: Positive for decreased concentration and depression. Negative for hallucinations.    Medications: I have reviewed the patient's current medications.  Current Outpatient Medications  Medication Sig Dispense Refill  . B Complex-C (SUPER B COMPLEX PO) Take 1 tablet by mouth daily.    . carbidopa-levodopa (SINEMET IR) 25-100 MG tablet Take 8 tablets by mouth at bedtime. 1am     . Cholecalciferol 50 MCG (2000 UT) CAPS Take 2,000 Units by mouth daily.     . cyanocobalamin (,VITAMIN B-12,) 1000 MCG/ML injection Inject 1,000 mcg into the muscle every 30 (thirty) days.    [name] ELIQUIS 5 MG TABS tablet TAKE 1 TABLET BY MOUTH TWICE A DAY 60 tablet 6  . escitalopram (LEXAPRO) 20 MG tablet TAKE 1 TABLET BY MOUTH EVERY DAY 90 tablet 1  . fludrocortisone (FLORINEF) 0.1 MG tablet Take 1 tablet (0.1  mg total) by mouth daily. 90 tablet 3  . midodrine (PROAMATINE) 10 MG tablet Take 1 tablet (10 mg total) by mouth 3 (three) times daily.    . mirtazapine (REMERON) 15 MG tablet Take 1 tablet (15 mg total) by mouth at bedtime. 90 tablet 0  . polyethylene glycol (MIRALAX / GLYCOLAX) 17 g packet Take 17 g by mouth 2 (two) times daily.    . QUEtiapine (SEROQUEL) 25 MG tablet TAKE 1 TABLET BY MOUTH TWICE A DAY 60 tablet 2  . senna (SENOKOT) 8.6 MG TABS tablet Take 2 tablets by mouth daily.     No current facility-administered medications for this visit.     Medication Side Effects: None  Allergies:  Allergies  Allergen Reactions  .  Lorazepam Other (See Comments)    Combative, foul language. Wife doesn't want patient to have again  . Tetracyclines & Related Anaphylaxis  . Amitiza [Lubiprostone] Nausea Only    Past Medical History:  Diagnosis Date  . Alzheimer's dementia (HCC)   . Anemia   . Anticoagulant long-term use    xarelto  . Anxiety   . Arthritis   . Autonomic dysfunction   . Baker's cyst    right knee  . Chronic constipation   . Diverticulosis   . Internal hemorrhoids   . Nocturia   . Numbness and tingling in right hand    since tablesaw injury 2002  . Numbness and tingling of both feet    toes  . Orthostatic hypotension   . PAF (paroxysmal atrial fibrillation) [facility])    cardiologist-- dr Graciela Husbands  . Parkinson's disease [facility])    neurologist-- dr tat  (idiopathic,  notes in epic)  . Tremor, essential   . Wears glasses     Family History  Problem Relation Age of Onset  . Lung cancer Mother   . Lung cancer Father   . Colon cancer Neg Hx   . Esophageal cancer Neg Hx   . Pancreatic cancer Neg Hx   . Stomach cancer Neg Hx   . Liver disease Neg Hx   . Rectal cancer Neg Hx     Social History   Socioeconomic History  . Marital status: Married    Spouse name: [name]  . Number of children: 3  . Years of education: Not on file  . Highest education level: Not on file  Occupational History  . Occupation: retired    Comment: Public house manager  . 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 Financial resource strain: Not on file  . Food insecurity    Worry: Not on file    Inability: Not on file  . Transportation needs    Medical: Not on file    Non-medical: Not on file  Tobacco Use  . Smoking status: Former Smoker    Years: 3.00    Types: Cigarettes    Quit date: 10/23/1956    Years since quitting: 62.2  . Smokeless tobacco: Never Used  Substance and Sexual Activity  . Alcohol use: Not Currently    Alcohol/week: 0.0 standard drinks  . Drug use: Never   . Sexual activity: Not on file  Lifestyle  . Physical activity    Days per week: Not on file    Minutes per session: Not on file  . Stress: Not on file  Relationships  . Social Musician on phone: Not on file    Gets together: Not on file  Attends religious service: Not on file    Active member of club or organization: Not on file    Attends meetings of clubs or organizations: Not on file    Relationship status: Not on file  . Intimate partner violence    Fear of current or ex partner: Not on file    Emotionally abused: Not on file    Physically abused: Not on file    Forced sexual activity: Not on file  Other Topics Concern  . Not on file  Social History Narrative  . Not on file    Past Medical History, Surgical history, Social history, and Family history were reviewed and updated as appropriate.   Please see review of systems for further details on the patient's review from today.   Objective:   Physical Exam:  There were no vitals taken for this visit.  Physical Exam Constitutional:      Appearance: He is ill-appearing.  Neurological:     Mental Status: He is alert.     Motor: Weakness present.     Gait: Gait abnormal.     Comments: Shuffling gait and balance problems without change  Psychiatric:        Attention and Perception: He does not perceive auditory or visual hallucinations.        Mood and Affect: Mood is not anxious or depressed.        Speech: Speech is delayed. Speech is not slurred.        Behavior: Behavior is slowed. Behavior is cooperative.        Thought Content: Thought content is not paranoid or delusional. Thought content does not include homicidal or suicidal ideation.        Cognition and Memory: Cognition is impaired. Memory is impaired.     Comments: Attention is fair.  Both patient and his wife are pleased with the reduction in depression and anxiety. He does not recall my name that he knows he should recall it because he  was told before he came.  He is not oriented to this specific date.  He is oriented to the situation. Insight and judgment are fair and limited because of cognitive impairment     Lab Review:     Component Value Date/Time   NA 143 09/29/2018 1133   NA 137 05/17/2018 1417   K 3.3 (L) 09/29/2018 1133   CL 104 09/29/2018 1133   CO2 33 (H) 09/29/2018 1133   GLUCOSE 97 09/29/2018 1133   BUN 23 09/29/2018 1133   BUN 14 05/17/2018 1417   CREATININE 0.78 09/29/2018 1133   CREATININE 0.82 08/06/2017 1554   CALCIUM 9.6 09/29/2018 1133   PROT 6.5 09/29/2018 1133   ALBUMIN 4.1 09/29/2018 1133   AST 39 (H) 09/29/2018 1133   ALT 31 09/29/2018 1133   ALKPHOS 38 (L) 09/29/2018 1133   BILITOT 0.7 09/29/2018 1133   GFRNONAA >60 07/29/2018 1903   GFRAA >60 07/29/2018 1903       Component Value Date/Time   WBC 4.4 09/29/2018 1133   RBC 4.57 09/29/2018 1133   HGB 14.2 09/29/2018 1133   HCT 42.1 09/29/2018 1133   PLT 195.0 09/29/2018 1133   MCV 92.1 09/29/2018 1133   MCH 30.0 07/29/2018 1903   MCHC 33.8 09/29/2018 1133   RDW 15.1 09/29/2018 1133   LYMPHSABS 1.3 09/29/2018 1133   MONOABS 0.5 09/29/2018 1133   EOSABS 0.1 09/29/2018 1133   BASOSABS 0.0 09/29/2018 1133    No results found  for: POCLITH, LITHIUM   No results found for: PHENYTOIN, PHENOBARB, VALPROATE, CBMZ   .res Assessment: Plan:    Baldo AshCarl was seen today for follow-up, depression and other.  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  He still has low appetite and significant depression and residual anxiety.  They are both satisfied with the results with meds for his anxiety and depression.  They are under control at this time.  continue Lexapro 20 mg daily for depression and  anxiety  Continue quetiapine as currently taking between 25 and 50 mg daily.  He is tolerating it well.  We discussed side effects including the potential for worsening Parkinson's symptoms.  Discussed potential metabolic side effects associated with atypical antipsychotics, as well as potential risk for movement side effects. Advised pt to contact office if movement side effects occur.   Continue mirtazapine 15 mg nightly .  It helped really quickly with appetite.    Overall good response to current med regimen.  They are satisfied.  Follow-up in 5 months  Meredith Staggersarey Cottle, MD, DFAPA   Please see After Visit Summary for patient specific instructions.  Future Appointments  Date Time Provider Department Center  01/05/2019  3:45 PM [facility]  03/24/2019  3:15 PM Duke SalviaKlein, Steven C, MD [facility]    No orders of the defined types were placed in this encounter.   -------------------------------

## 2019-01-06 LAB — BASIC METABOLIC PANEL
BUN/Creatinine Ratio: 24 (ref 10–24)
BUN: 21 mg/dL (ref 8–27)
CO2: 28 mmol/L (ref 20–29)
Calcium: 9.6 mg/dL (ref 8.6–10.2)
Chloride: 102 mmol/L (ref 96–106)
Creatinine, Ser: 0.88 mg/dL (ref 0.76–1.27)
GFR calc Af Amer: 92 mL/min/{1.73_m2} (ref >59)
GFR calc non Af Amer: 80 mL/min/{1.73_m2} (ref >59)
Glucose: 91 mg/dL (ref 65–99)
Potassium: 3.9 mmol/L (ref 3.5–5.2)
Sodium: 142 mmol/L (ref 134–144)

## 2019-01-11 ENCOUNTER — Telehealth: Payer: Self-pay

## 2019-01-11 NOTE — Telephone Encounter (Signed)
I spoke to the patient's wife who is calling because the patient is experiencing orthostatic BP/HR changes.  I moved up patient's appointment to 12/10 @ 2.  Message with vital signs was also sent.

## 2019-01-13 NOTE — Telephone Encounter (Signed)
[facility] will increase fludrocortisone 0.1>>0.2 qAM BP sys at home about 100 mm sitting They will let us know next week how he is doingh

## 2019-01-28 ENCOUNTER — Other Ambulatory Visit: Payer: Self-pay | Admitting: Psychiatry

## 2019-02-02 ENCOUNTER — Other Ambulatory Visit: Payer: Self-pay

## 2019-02-02 ENCOUNTER — Ambulatory Visit (INDEPENDENT_AMBULATORY_CARE_PROVIDER_SITE_OTHER): Payer: Medicare Other | Admitting: Internal Medicine

## 2019-02-02 ENCOUNTER — Encounter: Payer: Self-pay | Admitting: Internal Medicine

## 2019-02-02 VITALS — BP 109/64 | HR 80 | Ht 73.0 in | Wt 167.0 lb

## 2019-02-02 DIAGNOSIS — G20A1 Parkinson's disease without dyskinesia, without mention of fluctuations: Secondary | ICD-10-CM

## 2019-02-02 DIAGNOSIS — I951 Orthostatic hypotension: Secondary | ICD-10-CM | POA: Diagnosis not present

## 2019-02-02 DIAGNOSIS — I480 Paroxysmal atrial fibrillation: Secondary | ICD-10-CM

## 2019-02-02 DIAGNOSIS — G20 Parkinson's disease: Secondary | ICD-10-CM | POA: Diagnosis not present

## 2019-02-02 MED ORDER — FLUDROCORTISONE ACETATE 0.1 MG PO TABS: 0.2000 mg | ORAL_TABLET | Freq: Two times a day (BID) | ORAL | 3 refills | Status: DC

## 2019-02-02 MED ORDER — MIDODRINE HCL 10 MG PO TABS: ORAL_TABLET | ORAL | 3 refills | Status: DC

## 2019-02-02 NOTE — Patient Instructions (Addendum)
Medication Instructions:  Your physician has recommended you make the following change in your medication:   1. INCREASE: florinef 0.1 mg tablet: Take 2 tablets by mouth twice a day  2. TAKE: midodrine 10 mg tablet at 7 AM, 11 AM, and 3 PM everyday  If you need a refill on your cardiac medications before your next appointment, please call your pharmacy.   Lab work: None Ordered  If you have labs (blood work) drawn today and your tests are completely normal, you will receive your results only by: [name] MyChart Message (if you have MyChart) OR . A paper copy in the mail If you have any lab test that is abnormal or we need to change your treatment, we will call you to review the results.  Testing/Procedures: None ordered  Follow-Up: . Keep VIRTUAL Visit on 03/24/19 at 3:15 PM with Dr. Caryl Comes  Any Other Special Instructions Will Be Listed Below (If Applicable).
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
## 2019-02-04 ENCOUNTER — Other Ambulatory Visit: Payer: Self-pay | Admitting: Psychiatry

## 2019-02-04 DIAGNOSIS — F32A Depression, unspecified: Secondary | ICD-10-CM

## 2019-02-04 DIAGNOSIS — F320 Major depressive disorder, single episode, mild: Secondary | ICD-10-CM

## 2019-02-11 ENCOUNTER — Other Ambulatory Visit: Payer: Self-pay

## 2019-02-11 DIAGNOSIS — I951 Orthostatic hypotension: Secondary | ICD-10-CM

## 2019-02-11 DIAGNOSIS — I480 Paroxysmal atrial fibrillation: Secondary | ICD-10-CM

## 2019-02-14 ENCOUNTER — Other Ambulatory Visit: Payer: Medicare Other | Admitting: *Deleted

## 2019-02-14 ENCOUNTER — Other Ambulatory Visit: Payer: Self-pay

## 2019-02-14 DIAGNOSIS — I480 Paroxysmal atrial fibrillation: Secondary | ICD-10-CM

## 2019-02-14 DIAGNOSIS — I951 Orthostatic hypotension: Secondary | ICD-10-CM

## 2019-02-15 ENCOUNTER — Telehealth: Payer: Self-pay

## 2019-02-15 DIAGNOSIS — I480 Paroxysmal atrial fibrillation: Secondary | ICD-10-CM

## 2019-02-15 LAB — BASIC METABOLIC PANEL
BUN/Creatinine Ratio: 24 (ref 10–24)
BUN: 22 mg/dL (ref 8–27)
CO2: 30 mmol/L — ABNORMAL HIGH (ref 20–29)
Calcium: 9.4 mg/dL (ref 8.6–10.2)
Chloride: 101 mmol/L (ref 96–106)
Creatinine, Ser: 0.93 mg/dL (ref 0.76–1.27)
GFR calc Af Amer: 88 mL/min/{1.73_m2} (ref >59)
GFR calc non Af Amer: 76 mL/min/{1.73_m2} (ref >59)
Glucose: 81 mg/dL (ref 65–99)
Potassium: 3.2 mmol/L — ABNORMAL LOW (ref 3.5–5.2)
Sodium: 145 mmol/L — ABNORMAL HIGH (ref 134–144)

## 2019-02-15 MED ORDER — POTASSIUM CHLORIDE ER 10 MEQ PO TBCR: 10.0000 meq | EXTENDED_RELEASE_TABLET | Freq: Every day | ORAL | 3 refills | Status: DC

## 2019-02-15 NOTE — Telephone Encounter (Signed)
-----   Message from Deboraha Sprang, MD sent at 02/15/2019  5:44 PM EST ----- With low K Begin KCL 10 meq po bid x 5d then 10 meq daily BMET in 23 weeks

## 2019-02-15 NOTE — Telephone Encounter (Signed)
Reviewed Dr Olin Pia order's with pt's wife.  Wife states she will bring pt 03/09/2019 for labs. Verbalizes understanding of adding KCL to medication regimen.  Pt's wife agrees with plan.

## 2019-02-17 ENCOUNTER — Ambulatory Visit: Payer: Medicare Other | Admitting: Internal Medicine

## 2019-03-09 ENCOUNTER — Other Ambulatory Visit: Payer: Self-pay

## 2019-03-09 ENCOUNTER — Other Ambulatory Visit: Payer: Medicare Other | Admitting: *Deleted

## 2019-03-09 DIAGNOSIS — I480 Paroxysmal atrial fibrillation: Secondary | ICD-10-CM

## 2019-03-10 ENCOUNTER — Telehealth: Payer: Self-pay

## 2019-03-10 DIAGNOSIS — E876 Hypokalemia: Secondary | ICD-10-CM

## 2019-03-10 LAB — BASIC METABOLIC PANEL
BUN/Creatinine Ratio: 22 (ref 10–24)
BUN: 17 mg/dL (ref 8–27)
CO2: 29 mmol/L (ref 20–29)
Calcium: 9.5 mg/dL (ref 8.6–10.2)
Chloride: 101 mmol/L (ref 96–106)
Creatinine, Ser: 0.79 mg/dL (ref 0.76–1.27)
GFR calc Af Amer: 97 mL/min/{1.73_m2} (ref >59)
GFR calc non Af Amer: 84 mL/min/{1.73_m2} (ref >59)
Glucose: 102 mg/dL — ABNORMAL HIGH (ref 65–99)
Potassium: 3.2 mmol/L — ABNORMAL LOW (ref 3.5–5.2)
Sodium: 145 mmol/L — ABNORMAL HIGH (ref 134–144)

## 2019-03-10 MED ORDER — POTASSIUM CHLORIDE ER 10 MEQ PO TBCR: EXTENDED_RELEASE_TABLET | ORAL | 3 refills | Status: DC

## 2019-03-10 NOTE — Telephone Encounter (Signed)
Stephen Leavell, RN  03/10/2019 10:20 AM EST    Repeat BMP after starting potassium. Pt potassium is still low. Will discuss with Dr. Caryl Comes. Per Dr. Vinetta Bergamo potassium to 30 meq PO TID x 2 days. Then take 20 meq daily. Will repeat BMP in 3 weeks.   Call placed to Pt wife.  Advised to increase Kdur 10 meq to TID PO x 2 days.  Then take 20 meq PO daily.  Wife indicates understanding.  Schedule follow up BMP for 03/31/19.  Send addl kdur to pt pharmacy.

## 2019-03-20 ENCOUNTER — Ambulatory Visit: Payer: Medicare Other | Attending: Internal Medicine

## 2019-03-20 DIAGNOSIS — Z23 Encounter for immunization: Secondary | ICD-10-CM

## 2019-03-20 NOTE — Progress Notes (Signed)
   Covid-19 Vaccination Clinic  Name:  TC KAPUSTA    MRN: [id number] DOB: [birth year]  03/20/2019  Mr. Savarino was observed post Covid-19 immunization for 30 minutes based on pre-vaccination screening without incidence. He was provided with Vaccine Information Sheet and instruction to access the V-Safe system.   Mr. Blank was instructed to call 911 with any severe reactions post vaccine: [name] Difficulty breathing  . Swelling of your face and throat  . A fast heartbeat  . A bad rash all over your body  . Dizziness and weakness    Immunizations Administered    Name Date Dose VIS Date Route   Pfizer COVID-19 Vaccine 03/20/2019 12:36 PM 0.3 mL 02/18/2019 Intramuscular   Manufacturer: [employer], [employer]   Lot: [id number]   NDC: [address]-3

## 2019-03-24 ENCOUNTER — Other Ambulatory Visit: Payer: Self-pay

## 2019-03-24 ENCOUNTER — Telehealth (INDEPENDENT_AMBULATORY_CARE_PROVIDER_SITE_OTHER): Payer: Medicare Other | Admitting: Internal Medicine

## 2019-03-24 VITALS — BP 117/68 | HR 85 | Ht 73.0 in | Wt 165.5 lb

## 2019-03-24 DIAGNOSIS — I951 Orthostatic hypotension: Secondary | ICD-10-CM | POA: Diagnosis not present

## 2019-03-24 DIAGNOSIS — I480 Paroxysmal atrial fibrillation: Secondary | ICD-10-CM | POA: Diagnosis not present

## 2019-03-24 DIAGNOSIS — I4892 Unspecified atrial flutter: Secondary | ICD-10-CM

## 2019-03-24 MED ORDER — FLUDROCORTISONE ACETATE 0.1 MG PO TABS: 0.2000 mg | ORAL_TABLET | Freq: Two times a day (BID) | ORAL | 3 refills | Status: DC

## 2019-03-24 MED ORDER — POTASSIUM CHLORIDE ER 10 MEQ PO TBCR: 30.0000 meq | EXTENDED_RELEASE_TABLET | Freq: Every day | ORAL | 3 refills | Status: DC

## 2019-03-24 MED ORDER — FLUDROCORTISONE ACETATE 0.1 MG PO TABS: ORAL_TABLET | ORAL | 3 refills | Status: DC

## 2019-03-24 NOTE — Patient Instructions (Signed)
Spoke with pt's wife who is listed as DPR, Advised pt will increase his K+ to daily by mouth and Florinef 0.2mg  (2tabs) twice daily by mouth.  Pt is scheduled for labs 03/31/2019 and a virtual visit 05/12/2019 at 4pm.  Pt's wife verbalized understanding of instructions given and agrees with plan.
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
## 2019-03-29 ENCOUNTER — Other Ambulatory Visit: Payer: Self-pay

## 2019-03-29 MED ORDER — MIDODRINE HCL 10 MG PO TABS: ORAL_TABLET | ORAL | 3 refills | Status: AC

## 2019-03-31 ENCOUNTER — Other Ambulatory Visit: Payer: Medicare Other | Admitting: *Deleted

## 2019-03-31 ENCOUNTER — Other Ambulatory Visit: Payer: Self-pay

## 2019-03-31 DIAGNOSIS — E876 Hypokalemia: Secondary | ICD-10-CM

## 2019-04-01 LAB — BASIC METABOLIC PANEL
BUN/Creatinine Ratio: 29 — ABNORMAL HIGH (ref 10–24)
BUN: 22 mg/dL (ref 8–27)
CO2: 28 mmol/L (ref 20–29)
Calcium: 9.5 mg/dL (ref 8.6–10.2)
Chloride: 104 mmol/L (ref 96–106)
Creatinine, Ser: 0.76 mg/dL (ref 0.76–1.27)
GFR calc Af Amer: 98 mL/min/{1.73_m2} (ref >59)
GFR calc non Af Amer: 85 mL/min/{1.73_m2} (ref >59)
Glucose: 84 mg/dL (ref 65–99)
Potassium: 3.6 mmol/L (ref 3.5–5.2)
Sodium: 146 mmol/L — ABNORMAL HIGH (ref 134–144)

## 2019-04-05 ENCOUNTER — Other Ambulatory Visit: Payer: Self-pay | Admitting: Pharmacist

## 2019-04-05 MED ORDER — APIXABAN 5 MG PO TABS: 5.0000 mg | ORAL_TABLET | Freq: Two times a day (BID) | ORAL | 6 refills | Status: DC

## 2019-04-05 NOTE — Progress Notes (Signed)
Age 85, weight 75kg, SCr 0.76 on 03/31/19, afib indication, last OV Jan 2021, refill sent in

## 2019-04-10 ENCOUNTER — Ambulatory Visit: Payer: Medicare Other | Attending: Internal Medicine

## 2019-04-10 DIAGNOSIS — Z23 Encounter for immunization: Secondary | ICD-10-CM

## 2019-04-10 NOTE — Progress Notes (Signed)
   Covid-19 Vaccination Clinic  Name:  Stephen Gentry    MRN: [id number] DOB: 1934/10/05  04/10/2019  [name] was observed post Covid-19 immunization for 30 minutes based on pre-vaccination screening without incidence. He was provided with Vaccine Information Sheet and instruction to access the V-Safe system.   Mr. Law was instructed to call 911 with any severe reactions post vaccine: [name] Difficulty breathing  . Swelling of your face and throat  . A fast heartbeat  . A bad rash all over your body  . Dizziness and weakness    Immunizations Administered    Name Date Dose VIS Date Route   Pfizer COVID-19 Vaccine 04/10/2019  9:49 AM 0.3 mL 02/18/2019 Intramuscular   Manufacturer: [employer], [employer]   Lot: [id number]   NDC: [address]-6

## 2019-05-12 ENCOUNTER — Other Ambulatory Visit: Payer: Self-pay

## 2019-05-12 ENCOUNTER — Telehealth (INDEPENDENT_AMBULATORY_CARE_PROVIDER_SITE_OTHER): Payer: Medicare Other | Admitting: Internal Medicine

## 2019-05-12 VITALS — BP 134/88 | HR 83 | Ht 73.0 in | Wt 171.0 lb

## 2019-05-12 DIAGNOSIS — I951 Orthostatic hypotension: Secondary | ICD-10-CM | POA: Diagnosis not present

## 2019-05-12 DIAGNOSIS — R002 Palpitations: Secondary | ICD-10-CM

## 2019-05-12 DIAGNOSIS — I4892 Unspecified atrial flutter: Secondary | ICD-10-CM | POA: Diagnosis not present

## 2019-05-12 NOTE — Patient Instructions (Signed)
  Medication Instructions:  Your physician recommends that you continue on your current medications as directed. Please refer to the Current Medication list given to you today.  *If you need a refill on your cardiac medications before your next appointment, please call your pharmacy*   Lab Work: None ordered.  If you have labs (blood work) drawn today and your tests are completely normal, you will receive your results only by: [name] MyChart Message (if you have MyChart) OR . A paper copy in the mail If you have any lab test that is abnormal or we need to change your treatment, we will call you to review the results.   Testing/Procedures: Your physician has recommended that you wear an event monitor. Event monitors are medical devices that record the heart's electrical activity. Doctors most often [place] these monitors to diagnose arrhythmias. Arrhythmias are problems with the speed or rhythm of the heartbeat. The monitor is a small, portable device. You can wear one while you do your normal daily activities. This is usually used to diagnose what is causing palpitations/syncope (passing out).     Follow-Up: At [facility], you and your health needs are our priority.  As part of our continuing mission to provide you with exceptional heart care, we have created designated Provider Care Teams.  These Care Teams include your primary Cardiologist (physician) and Advanced Practice Providers (APPs -  Physician Assistants and Nurse Practitioners) who all work together to provide you with the care you need, when you need it.  We recommend signing up for the patient portal called "MyChart".  Sign up information is provided on this After Visit Summary.  MyChart is used to connect with patients for Virtual Visits (Telemedicine).  Patients are able to view lab/test results, encounter notes, upcoming appointments, etc.  Non-urgent messages can be sent to your provider as well.   To learn more about what you can  do with MyChart, go to [email or web address].    Your next appointment:   April 8,2021 at 2pm  The format for your next appointment:   Virtual Visit   Provider:  Dr Graciela Husbands
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
## 2019-05-12 NOTE — Addendum Note (Signed)
Addended by: Alois Cliche on: 05/12/2019 06:28 PM   Modules accepted: Orders

## 2019-05-13 ENCOUNTER — Telehealth: Payer: Self-pay | Admitting: Radiology

## 2019-05-13 NOTE — Telephone Encounter (Signed)
Enrolled patient for a 14 day Zio monitor to be mailed to patients home.  

## 2019-05-18 ENCOUNTER — Other Ambulatory Visit (INDEPENDENT_AMBULATORY_CARE_PROVIDER_SITE_OTHER): Payer: Medicare Other

## 2019-05-18 DIAGNOSIS — R002 Palpitations: Secondary | ICD-10-CM

## 2019-05-18 DIAGNOSIS — I951 Orthostatic hypotension: Secondary | ICD-10-CM | POA: Diagnosis not present

## 2019-05-18 DIAGNOSIS — I4892 Unspecified atrial flutter: Secondary | ICD-10-CM | POA: Diagnosis not present

## 2019-06-01 ENCOUNTER — Other Ambulatory Visit: Payer: Self-pay | Admitting: Psychiatry

## 2019-06-01 DIAGNOSIS — F320 Major depressive disorder, single episode, mild: Secondary | ICD-10-CM

## 2019-06-01 DIAGNOSIS — F32A Depression, unspecified: Secondary | ICD-10-CM

## 2019-06-06 ENCOUNTER — Telehealth: Payer: Self-pay | Admitting: Internal Medicine

## 2019-06-06 NOTE — Telephone Encounter (Signed)
Patient's wife calling to reschedule his appt with Dr. Graciela Husbands on 06/16/19. I did not see any appt's available until May 20th and offered an appt with one of the PA's. She did not want to schedule with one of them and is requesting to speak with Dr. Odessa Fleming nurse in regards to finding a day sooner to schedule with Dr. Graciela Husbands.

## 2019-06-07 ENCOUNTER — Encounter: Payer: Self-pay | Admitting: Psychiatry

## 2019-06-07 ENCOUNTER — Ambulatory Visit (INDEPENDENT_AMBULATORY_CARE_PROVIDER_SITE_OTHER): Payer: Medicare Other | Admitting: Psychiatry

## 2019-06-07 ENCOUNTER — Other Ambulatory Visit: Payer: Self-pay

## 2019-06-07 DIAGNOSIS — F32A Depression, unspecified: Secondary | ICD-10-CM

## 2019-06-07 DIAGNOSIS — F320 Major depressive disorder, single episode, mild: Secondary | ICD-10-CM

## 2019-06-07 DIAGNOSIS — F0280 Dementia in other diseases classified elsewhere without behavioral disturbance: Secondary | ICD-10-CM

## 2019-06-07 DIAGNOSIS — G20 Parkinson's disease: Secondary | ICD-10-CM

## 2019-06-07 MED ORDER — MIRTAZAPINE 30 MG PO TABS: 30.0000 mg | ORAL_TABLET | Freq: Every day | ORAL | 1 refills | Status: DC

## 2019-06-07 NOTE — Progress Notes (Signed)
Stephen Gentry [id number] 1934-04-18 85 y.o.  Subjective:   Patient ID:  Stephen Gentry is a 53 y.o. (DOB [birth year]) male.  Chief Complaint:  Chief Complaint  Patient presents with  . Follow-up    mood and anxiety and appetite and meds    Depression        Associated symptoms include decreased concentration, fatigue and appetite change.  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  Seen with wife [name].  His first visit was September 06, 2018.  For the symptoms of anxiety, dread and obsessing over dying associated with some psychotic symptoms we altered dosing of quetiapine to half of a 25 mg tablet twice daily for 4 to 5 days and if not effective increase to 1 tablet twice daily if needed.  He was to continue on the Lexapro 20 mg daily but if it failed to adequately manage his anxiety there was consideration of switching to paroxetine although there are greater drug interaction issues with that medicine.  visit  November 08, 2018.  Lexapro 20 mg was continued as was quetiapine between 25 and 50 mg daily.  Mirtazapine 15 mg was added. Per wife mirtazapine 15 mg with a much improved appetite and weight.  "Wonder drug". Less bloating with Colace and Senna.  Last visit October 2020.  No meds were changed.  Just had Holter monitor.  Pending eval for afib. Overall health worse with a lot of bad days with health inluding heart and PD.  Weakness and exhaustion easily.  SOB.  Some depression over things he can't do. Sleep good.  Not real good bc PD makes it difficult to do things.  Wonders about tthe future.  Depressed bc nature  Of the disease.  Abd pain off and on but seems more settled.  Sleep pretty good. Staying up until 9 and then wonders if he took pills and get OOB.  Sleeps about 12 hours.  They don't sleep together bc of dogs. No much anxiety and no longer pacing.  Not asking to go to  the ER like before meds.       Past Psychiatric Medication Trials: On Lexapro for nearly 3 years.  Started quetiapine 25 mg May 2020, sertraline 50 less effective, Abilify for 2 weeks without response.  Review of Systems:  Review of Systems  Constitutional: Positive for appetite change and fatigue.  Gastrointestinal: Negative for abdominal pain and constipation.  Neurological: Positive for tremors and weakness.       Shuffling gait and balance problems without change Neuropathy feet and hands  Psychiatric/Behavioral: Positive for decreased concentration and depression. Negative for hallucinations.    Medications: I have reviewed the patient's current medications.  Current Outpatient Medications  Medication Sig Dispense Refill  . apixaban (ELIQUIS) 5 MG TABS tablet Take 1 tablet (5 mg total) by mouth 2 (two) times daily. 60 tablet 6  . B Complex-C (SUPER B COMPLEX PO) Take 1 tablet by mouth daily.    . Carbidopa-Levodopa ER (SINEMET CR) 25-100 MG tablet controlled release Take 8 tablets by mouth as directed. 2 tablets at 6AM, 2 tablets at 11AM, 2 tablets at 4PM, 1 tablet at bedtime, and 1 tablet at 1AM    . Cholecalciferol 50 MCG (2000 UT) CAPS Take 2,000 Units by mouth daily.     . cyanocobalamin (,VITAMIN B-12,) 1000 MCG/ML injection Inject 1,000 mcg into the muscle every 30 (thirty) days.    Stephen Gentry  escitalopram (LEXAPRO) 20 MG tablet TAKE 1 TABLET BY MOUTH EVERY DAY 90 tablet 1  . fludrocortisone (FLORINEF) 0.1 MG tablet Take 2 tablets (0.2 mg total) by mouth 2 (two) times daily. 360 tablet 3  . midodrine (PROAMATINE) 10 MG tablet Take 1 tablet (10 mg) at 7 AM, 11 AM, and 3 PM 270 tablet 3  . mirtazapine (REMERON) 15 MG tablet TAKE 1 TABLET BY MOUTH AT BEDTIME 30 tablet 0  . polyethylene glycol (MIRALAX / GLYCOLAX) 17 g packet Take 17 g by mouth 2 (two) times daily.    . potassium chloride (KLOR-CON) 10 MEQ tablet Take 3 tablets (30 mEq total) by mouth daily. 270 tablet 3  . QUEtiapine  (SEROQUEL) 25 MG tablet TAKE 1 TABLET BY MOUTH TWICE A DAY 180 tablet 1  . senna (SENOKOT) 8.6 MG TABS tablet Take 2 tablets by mouth daily.     No current facility-administered medications for this visit.    Medication Side Effects: None  Allergies:  Allergies  Allergen Reactions  . Lorazepam Other (See Comments)    Combative, foul language. Wife doesn't want patient to have again  . Tetracyclines & Related Anaphylaxis  . Amitiza [Lubiprostone] Nausea Only    Past Medical History:  Diagnosis Date  . Alzheimer's dementia ([place])   . Anemia   . Anticoagulant long-term use    xarelto  . Anxiety   . Arthritis   . Autonomic dysfunction   . Baker's cyst    right knee  . Chronic constipation   . Diverticulosis   . Internal hemorrhoids   . Nocturia   . Numbness and tingling in right hand    since tablesaw injury 2002  . Numbness and tingling of both feet    toes  . Orthostatic hypotension   . PAF (paroxysmal atrial fibrillation) [facility])    cardiologist-- dr Caryl Comes  . Parkinson's disease [facility])    neurologist-- dr tat  (idiopathic,  notes in epic)  . Tremor, essential   . Wears glasses     Family History  Problem Relation Age of Onset  . Lung cancer Mother   . Lung cancer Father   . Colon cancer Neg Hx   . Esophageal cancer Neg Hx   . Pancreatic cancer Neg Hx   . Stomach cancer Neg Hx   . Liver disease Neg Hx   . Rectal cancer Neg Hx     Social History   Socioeconomic History  . Marital status: Married    Spouse name: [name]  . Number of children: 3  . Years of education: Not on file  . Highest education level: Not on file  Occupational History  . Occupation: retired    Comment: Counsellor  . 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 Smoking status: Former Smoker    Years: 3.00    Types: Cigarettes    Quit date: 10/23/1956    Years since quitting: 62.6  . Smokeless tobacco: Never Used  Substance and Sexual Activity   . Alcohol use: Not Currently    Alcohol/week: 0.0 standard drinks  . Drug use: Never  . Sexual activity: Not on file  Other Topics Concern  . Not on file  Social History Narrative  . Not on file   Social Determinants of Health   Financial Resource Strain:   . Difficulty of Paying Living Expenses:   Food Insecurity:   . Worried About Charity fundraiser in the Last Year:   .  Ran Out of Food in the Last Year:   Transportation Needs:   . Freight forwarder (Medical):   Stephen Gentry Lack of Transportation (Non-Medical):   Physical Activity:   . Days of Exercise per Week:   . Minutes of Exercise per Session:   Stress:   . Feeling of Stress :   Social Connections:   . Frequency of Communication with Friends and Family:   . Frequency of Social Gatherings with Friends and Family:   . Attends Religious Services:   . Active Member of Clubs or Organizations:   . Attends Banker Meetings:   Stephen Gentry Marital Status:   Intimate Partner Violence:   . Fear of Current or Ex-Partner:   . Emotionally Abused:   Stephen Gentry Physically Abused:   . Sexually Abused:     Past Medical History, Surgical history, Social history, and Family history were reviewed and updated as appropriate.   Please see review of systems for further details on the patient's review from today.   Objective:   Physical Exam:  There were no vitals taken for this visit.  Physical Exam Constitutional:      Appearance: He is ill-appearing.  Neurological:     Mental Status: He is alert.     Motor: Weakness present.     Gait: Gait abnormal.     Comments: Shuffling gait and balance problems without change  Psychiatric:        Attention and Perception: He does not perceive auditory or visual hallucinations.        Mood and Affect: Mood is depressed. Mood is not anxious.        Speech: Speech is delayed. Speech is not slurred.        Behavior: Behavior is slowed. Behavior is cooperative.        Thought Content: Thought  content is not paranoid or delusional. Thought content does not include homicidal or suicidal ideation.        Cognition and Memory: Cognition is impaired. Memory is impaired.     Comments: Attention is fair.   Worsening mood over health. He does not recall my name that he knows he should recall it because he was told before he came.  He is not oriented to this specific date.  He is oriented to the situation. Insight and judgment are fair and limited because of cognitive impairment     Lab Review:     Component Value Date/Time   NA 146 (H) 03/31/2019 1403   K 3.6 03/31/2019 1403   CL 104 03/31/2019 1403   CO2 28 03/31/2019 1403   GLUCOSE 84 03/31/2019 1403   GLUCOSE 97 09/29/2018 1133   BUN 22 03/31/2019 1403   CREATININE 0.76 03/31/2019 1403   CREATININE 0.82 08/06/2017 1554   CALCIUM 9.5 03/31/2019 1403   PROT 6.5 09/29/2018 1133   ALBUMIN 4.1 09/29/2018 1133   AST 39 (H) 09/29/2018 1133   ALT 31 09/29/2018 1133   ALKPHOS 38 (L) 09/29/2018 1133   BILITOT 0.7 09/29/2018 1133   GFRNONAA 85 03/31/2019 1403   GFRAA 98 03/31/2019 1403       Component Value Date/Time   WBC 4.4 09/29/2018 1133   RBC 4.57 09/29/2018 1133   HGB 14.2 09/29/2018 1133   HCT 42.1 09/29/2018 1133   PLT 195.0 09/29/2018 1133   MCV 92.1 09/29/2018 1133   MCH 30.0 07/29/2018 1903   MCHC 33.8 09/29/2018 1133   RDW 15.1 09/29/2018 1133   LYMPHSABS 1.3 09/29/2018  1133   MONOABS 0.5 09/29/2018 1133   EOSABS 0.1 09/29/2018 1133   BASOSABS 0.0 09/29/2018 1133    No results found for: POCLITH, LITHIUM   No results found for: PHENYTOIN, PHENOBARB, VALPROATE, CBMZ   .res Assessment: Plan:    Carrson was seen today for follow-up.  Diagnoses and all orders for this visit:  Mild depression (HCC)  Dementia associated with Parkinson's disease (HCC)    The increase in quetiapine has been helpful at reducing some of the anxiety and rumination and pacing.  He still has low appetite and significant  depression and residual anxiety.  They are both satisfied with the results with meds for his anxiety and depression.  They are under control at this time.  continue Lexapro 20 mg daily for depression and anxiety  Continue quetiapine as currently taking between 25 and 50 mg daily.  He is tolerating it well.  We discussed side effects including the potential for worsening Parkinson's symptoms.  Discussed potential metabolic side effects associated with atypical antipsychotics, as well as potential risk for movement side effects. Advised pt to contact office if movement side effects occur.   Trial increase mirtazapine to 30 mg HS .  It helped really quickly with appetite but he is progressively somewhat worse.    They are currently seeing an nurse practitioner managing his Parkinson's meds.  His Parkinson's disease is advancing.  It is reasonable to seek a neurologist to see if there can be any improvement in his treatment.  Follow-up in 3 months  Meredith Staggers, MD, DFAPA   Please see After Visit Summary for patient specific instructions.  Future Appointments  Date Time Provider Department Center  06/23/2019  2:30 PM Duke Salvia, MD [facility]    No orders of the defined types were placed in this encounter.   -------------------------------

## 2019-06-16 ENCOUNTER — Telehealth: Payer: Medicare Other | Admitting: Internal Medicine

## 2019-06-23 ENCOUNTER — Telehealth (INDEPENDENT_AMBULATORY_CARE_PROVIDER_SITE_OTHER): Payer: Medicare Other | Admitting: Internal Medicine

## 2019-06-23 ENCOUNTER — Other Ambulatory Visit: Payer: Self-pay

## 2019-06-23 VITALS — BP 104/64 | HR 83 | Wt 168.0 lb

## 2019-06-23 DIAGNOSIS — Z712 Person consulting for explanation of examination or test findings: Secondary | ICD-10-CM

## 2019-06-23 DIAGNOSIS — I4891 Unspecified atrial fibrillation: Secondary | ICD-10-CM | POA: Diagnosis not present

## 2019-06-23 DIAGNOSIS — Z87891 Personal history of nicotine dependence: Secondary | ICD-10-CM | POA: Diagnosis not present

## 2019-06-23 DIAGNOSIS — G20 Parkinson's disease: Secondary | ICD-10-CM

## 2019-06-23 DIAGNOSIS — I951 Orthostatic hypotension: Secondary | ICD-10-CM

## 2019-06-23 DIAGNOSIS — F458 Other somatoform disorders: Secondary | ICD-10-CM | POA: Diagnosis not present

## 2019-06-23 DIAGNOSIS — I4892 Unspecified atrial flutter: Secondary | ICD-10-CM | POA: Diagnosis not present

## 2019-06-23 DIAGNOSIS — E876 Hypokalemia: Secondary | ICD-10-CM

## 2019-06-23 DIAGNOSIS — F329 Major depressive disorder, single episode, unspecified: Secondary | ICD-10-CM
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
## 2019-06-24 NOTE — Patient Instructions (Addendum)
Medication Instructions:  Your physician recommends that you continue on your current medications as directed. Please refer to the Current Medication list given to you today.  *If you need a refill on your cardiac medications before your next appointment, please call your pharmacy*   Lab Work: BMET  If you have labs (blood work) drawn today and your tests are completely normal, you will receive your results only by: [name] MyChart Message (if you have MyChart) OR . A paper copy in the mail If you have any lab test that is abnormal or we need to change your treatment, we will call you to review the results.   Testing/Procedure:.  None ordered.   Follow-Up: At [facility], you and your health needs are our priority.  As part of our continuing mission to provide you with exceptional heart care, we have created designated Provider Care Teams.  These Care Teams include your primary Cardiologist (physician) and Advanced Practice Providers (APPs -  Physician Assistants and Nurse Practitioners) who all work together to provide you with the care you need, when you need it.  We recommend signing up for the patient portal called "MyChart".  Sign up information is provided on this After Visit Summary.  MyChart is used to connect with patients for Virtual Visits (Telemedicine).  Patients are able to view lab/test results, encounter notes, upcoming appointments, etc.  Non-urgent messages can be sent to your provider as well.   To learn more about what you can do with MyChart, go to [email or web address].    Your next appointment:   08/04/2019 at 145pm  The format for your next appointment:   Virtual Visit   Provider:   Dr Graciela Husbands

## 2019-06-26 ENCOUNTER — Other Ambulatory Visit: Payer: Self-pay | Admitting: Psychiatry

## 2019-06-26 DIAGNOSIS — F320 Major depressive disorder, single episode, mild: Secondary | ICD-10-CM

## 2019-06-26 DIAGNOSIS — F32A Depression, unspecified: Secondary | ICD-10-CM

## 2019-07-25 ENCOUNTER — Telehealth: Payer: Self-pay

## 2019-07-25 NOTE — Telephone Encounter (Signed)
Spoke with pt's wife (DPR) who states pt has appt with Dr M.Siddiqui, neurology at [facility] on 08/03/2019.  Pt's wife wishes to change pt's appointment with Dr Graciela Husbands from 08/04/2019 to 08/11/2019.  Appt rescheduled as requested.  Pt's wife states she plans to have PCP repeat pt's BMET to assess potassium level as [facility] office is too far.  BMET order placed and released.  Advised pt may go to any [facility] in the area to have BMET.  Pt's wife verbalizes understanding and agrees with current plan.

## 2019-07-25 NOTE — Addendum Note (Signed)
Addended by: Alois Cliche on: 07/25/2019 06:33 PM   Modules accepted: Orders

## 2019-07-25 NOTE — Addendum Note (Signed)
Addended by: Alois Cliche on: 07/25/2019 06:32 PM   Modules accepted: Orders

## 2019-07-28 ENCOUNTER — Other Ambulatory Visit: Payer: Self-pay

## 2019-07-28 ENCOUNTER — Other Ambulatory Visit: Payer: Medicare Other

## 2019-07-29 ENCOUNTER — Other Ambulatory Visit: Payer: Self-pay | Admitting: Psychiatry

## 2019-07-29 LAB — BASIC METABOLIC PANEL
BUN/Creatinine Ratio: 22 (ref 10–24)
BUN: 18 mg/dL (ref 8–27)
CO2: 28 mmol/L (ref 20–29)
Calcium: 9.4 mg/dL (ref 8.6–10.2)
Chloride: 103 mmol/L (ref 96–106)
Creatinine, Ser: 0.81 mg/dL (ref 0.76–1.27)
GFR calc Af Amer: 95 mL/min/{1.73_m2} (ref >59)
GFR calc non Af Amer: 82 mL/min/{1.73_m2} (ref >59)
Glucose: 83 mg/dL (ref 65–99)
Potassium: 3.7 mmol/L (ref 3.5–5.2)
Sodium: 143 mmol/L (ref 134–144)

## 2019-08-04 ENCOUNTER — Telehealth: Payer: Medicare Other | Admitting: Internal Medicine

## 2019-08-11 ENCOUNTER — Telehealth (INDEPENDENT_AMBULATORY_CARE_PROVIDER_SITE_OTHER): Payer: Medicare Other | Admitting: Internal Medicine

## 2019-08-11 ENCOUNTER — Other Ambulatory Visit: Payer: Self-pay

## 2019-08-11 VITALS — BP 102/67 | HR 90 | Wt 166.0 lb

## 2019-08-11 DIAGNOSIS — I4892 Unspecified atrial flutter: Secondary | ICD-10-CM | POA: Diagnosis not present

## 2019-08-11 DIAGNOSIS — I951 Orthostatic hypotension: Secondary | ICD-10-CM

## 2019-08-11 NOTE — Patient Instructions (Addendum)
Medication Instructions:  Your physician recommends that you continue on your current medications as directed. Please refer to the Current Medication list given to you today. *If you need a refill on your cardiac medications before your next appointment, please call your pharmacy*   Lab Work:  None ordered.  If you have labs (blood work) drawn today and your tests are completely normal, you will receive your results only by: [name] MyChart Message (if you have MyChart) OR . A paper copy in the mail If you have any lab test that is abnormal or we need to change your treatment, we will call you to review the results.   Testing/Procedures: None ordered.    Follow-Up: At [facility], you and your health needs are our priority.  As part of our continuing mission to provide you with exceptional heart care, we have created designated Provider Care Teams.  These Care Teams include your primary Cardiologist (physician) and Advanced Practice Providers (APPs -  Physician Assistants and Nurse Practitioners) who all work together to provide you with the care you need, when you need it.  We recommend signing up for the patient portal called "MyChart".  Sign up information is provided on this After Visit Summary.  MyChart is used to connect with patients for Virtual Visits (Telemedicine).  Patients are able to view lab/test results, encounter notes, upcoming appointments, etc.  Non-urgent messages can be sent to your provider as well.   To learn more about what you can do with MyChart, go to [email or web address].    Your next appointment:   Thursday, 10/27/2019 at 315pm  The format for your next appointment:   Virtual Visit   Provider:   Sherryl Manges, MD
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
## 2019-09-07 ENCOUNTER — Encounter: Payer: Self-pay | Admitting: Psychiatry

## 2019-09-07 ENCOUNTER — Other Ambulatory Visit: Payer: Self-pay

## 2019-09-07 ENCOUNTER — Ambulatory Visit (INDEPENDENT_AMBULATORY_CARE_PROVIDER_SITE_OTHER): Payer: Medicare Other | Admitting: Psychiatry

## 2019-09-07 DIAGNOSIS — F0280 Dementia in other diseases classified elsewhere without behavioral disturbance: Secondary | ICD-10-CM | POA: Diagnosis not present

## 2019-09-07 DIAGNOSIS — G20 Parkinson's disease: Secondary | ICD-10-CM | POA: Diagnosis not present

## 2019-09-07 DIAGNOSIS — F320 Major depressive disorder, single episode, mild: Secondary | ICD-10-CM

## 2019-09-07 DIAGNOSIS — F32A Depression, unspecified: Secondary | ICD-10-CM

## 2019-09-07 MED ORDER — MIRTAZAPINE 30 MG PO TABS: 30.0000 mg | ORAL_TABLET | Freq: Every day | ORAL | 0 refills | Status: DC

## 2019-09-07 NOTE — Progress Notes (Signed)
Stephen ProwsCarl F Podolski [id number] 03/09/1935 85 y.o.  Subjective:   Patient ID:  Stephen Gentry is a 85 y.o. (DOB 06/09/1934) male.  Chief Complaint:  Chief Complaint  Patient presents with  . Follow-up  . Altered Mental Status  . Memory Loss  . Depression  . Fatigue    Depression        Associated symptoms include decreased concentration, fatigue and appetite change.  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  Seen with wife Stephen Gentry.  His first visit was September 06, 2018.  For the symptoms of anxiety, dread and obsessing over dying associated with some psychotic symptoms we altered dosing of quetiapine to half of a 25 mg tablet twice daily for 4 to 5 days and if not effective increase to 1 tablet twice daily if needed.  He was to continue on the Lexapro 20 mg daily but if it failed to adequately manage his anxiety there was consideration of switching to paroxetine although there are greater drug interaction issues with that medicine.  visit  November 08, 2018.  Lexapro 20 mg was continued as was quetiapine between 25 and 50 mg daily.  Mirtazapine 15 mg was added. Per wife mirtazapine 15 mg with a much improved appetite and weight.  "Wonder drug". Less bloating with Colace and Senna.  at visit October 2020.  No meds were changed.  March 2021 visit the following is noted: Just had Holter monitor.  Pending eval for afib. Overall health worse with a lot of bad days with health inluding heart and PD.  Weakness and exhaustion easily.  SOB.  Some depression over things he can't do. Sleep good. Not real good bc PD makes it difficult to do things.  Wonders about tthe future.  Depressed bc nature  Of the disease.  Abd pain off and on but seems more settled.  Sleep pretty good. Staying up until 9 and then wonders if he took pills and get OOB.  Sleeps about 12 hours.  They don't sleep together bc of  dogs. No much anxiety and no longer pacing.  Not asking to go to the ER like before meds.      Plan:Trial increase mirtazapine to 30 mg HS .  It helped really quickly with appetite but he is progressively somewhat worse.    09/07/2019 appointment the following is noted: He still feels bad every day.  Fatigue, brain "fog".  Will say he needs to be in the hospital.  Tremor and bloating.  Appetite not better. Wt 163#  Reduced quetiapine to 25 mg HS per neuro bc of low blood pressure.  Not sure if he's more dysphoric but might be.  Blood pressure maybe better.   Not pacing. He can't follow TV show with plot.  Past Psychiatric Medication Trials: On Lexapro for nearly 3 years.   Started quetiapine 25 mg May 2020,  sertraline 50 less effective,  Abilify for 2 weeks without response. Mirtazapine 30 helped appetite  Review of Systems:  Review of Systems  Constitutional: Positive for appetite change and fatigue.  Gastrointestinal: Positive for abdominal pain. Negative for constipation.  Neurological: Positive for tremors and weakness.       Shuffling gait and balance problems without change Neuropathy feet and hands  Psychiatric/Behavioral: Positive for decreased concentration and depression. Negative for hallucinations.    Medications: I have reviewed the patient's current medications.  Current Outpatient Medications  Medication Sig Dispense Refill  . apixaban (ELIQUIS) 5 MG TABS tablet Take 1 tablet (5 mg total) by mouth 2 (two) times daily. 60 tablet 6  . B Complex-C (SUPER B COMPLEX PO) Take 1 tablet by mouth daily.    . Carbidopa-Levodopa ER (SINEMET CR) 25-100 MG tablet controlled release Take 8 tablets by mouth as directed. 2 tablets at 6AM, 2 tablets at 11AM, 2 tablets at 4PM, 1 tablet at bedtime, and 1 tablet at 1AM    . Cholecalciferol 50 MCG (2000 UT) CAPS Take 2,000 Units by mouth daily.     . cyanocobalamin (,VITAMIN B-12,) 1000 MCG/ML injection Inject 1,000 mcg into the muscle  every 30 (thirty) days.    [name] escitalopram (LEXAPRO) 20 MG tablet TAKE 1 TABLET BY MOUTH EVERY DAY 90 tablet 1  . fludrocortisone (FLORINEF) 0.1 MG tablet Take 2 tablets (0.2 mg total) by mouth 2 (two) times daily. 360 tablet 3  . midodrine (PROAMATINE) 10 MG tablet Take 1 tablet (10 mg) at 7 AM, 11 AM, and 3 PM 270 tablet 3  . mirtazapine (REMERON) 30 MG tablet Take 1 tablet (30 mg total) by mouth at bedtime. 90 tablet 1  . polyethylene glycol (MIRALAX / GLYCOLAX) 17 g packet Take 17 g by mouth 2 (two) times daily.    . potassium chloride (KLOR-CON) 10 MEQ tablet Take 3 tablets (30 mEq total) by mouth daily. 270 tablet 3  . QUEtiapine (SEROQUEL) 25 MG tablet TAKE 1 TABLET BY MOUTH TWICE A DAY (Patient taking differently: Take 25 mg by mouth at bedtime. Reduced early June bc low blood pressure) 180 tablet 1  . rivastigmine (EXELON) 4.6 mg/24hr Place 4.6 mg onto the skin daily.    [name] senna (SENOKOT) 8.6 MG TABS tablet Take 2 tablets by mouth daily.     No current facility-administered medications for this visit.    Medication Side Effects: None  Allergies:  Allergies  Allergen Reactions  . Lorazepam Other (See Comments)    Combative, foul language. Wife doesn't want patient to have again  . Tetracyclines & Related Anaphylaxis  . Amitiza [Lubiprostone] Nausea Only    Past Medical History:  Diagnosis Date  . Alzheimer's dementia (H[facility])   . Anemia   . Anticoagulant long-term use    xarelto  . Anxiety   . Arthritis   . Autonomic dysfunction   . Baker's cyst    right knee  . Chronic constipation   . Diverticulosis   . Internal hemorrhoids   . Nocturia   . Numbness and tingling in right hand    since tablesaw injury 2002  . Numbness and tingling of both feet    toes  . Orthostatic hypotension   . PAF (paroxysmal atrial fibrillation) [facility])    cardiologist-- dr Graciela Husbands  . Parkinson's disease [facility])    neurologist-- dr tat  (idiopathic,  notes in epic)  . Tremor, essential   .  Wears glasses     Family History  Problem Relation Age of Onset  . Lung cancer Mother   . Lung cancer Father   . Colon cancer Neg Hx   . Esophageal cancer Neg Hx   . Pancreatic cancer Neg Hx   . Stomach cancer Neg Hx   . Liver disease Neg Hx   . Rectal cancer Neg Hx     Social History   Socioeconomic History  . Marital status: Married    Spouse name: [name]  . Number of children: 3  . Years  of education: Not on file  . Highest education level: Not on file  Occupational History  . Occupation: retired    Comment: Public house manager  . Occupation: retired    Comment: Medical sales representative  Tobacco Use  . Smoking status: Former Smoker    Years: 3.00    Types: Cigarettes    Quit date: 10/23/1956    Years since quitting: 62.9  . Smokeless tobacco: Never Used  Vaping Use  . Vaping Use: Never used  Substance and Sexual Activity  . Alcohol use: Not Currently    Alcohol/week: 0.0 standard drinks  . Drug use: Never  . Sexual activity: Not on file  Other Topics Concern  . Not on file  Social History Narrative  . Not on file   Social Determinants of Health   Financial Resource Strain:   . Difficulty of Paying Living Expenses:   Food Insecurity:   . Worried About Programme researcher, broadcasting/film/video in the Last Year:   . Barista in the Last Year:   Transportation Needs:   . Freight forwarder (Medical):   [name] Lack of Transportation (Non-Medical):   Physical Activity:   . Days of Exercise per Week:   . Minutes of Exercise per Session:   Stress:   . Feeling of Stress :   Social Connections:   . Frequency of Communication with Friends and Family:   . Frequency of Social Gatherings with Friends and Family:   . Attends Religious Services:   . Active Member of Clubs or Organizations:   . Attends Banker Meetings:   [name] Marital Status:   Intimate Partner Violence:   . Fear of Current or Ex-Partner:   . Emotionally Abused:   [name] Physically Abused:   . Sexually  Abused:     Past Medical History, Surgical history, Social history, and Family history were reviewed and updated as appropriate.   Please see review of systems for further details on the patient's review from today.   Objective:   Physical Exam:  There were no vitals taken for this visit.  Physical Exam Constitutional:      Appearance: He is ill-appearing.  Neurological:     Mental Status: He is alert.     Motor: Weakness and tremor present.     Gait: Gait abnormal.     Comments: Shuffling gait and balance problems without change  Psychiatric:        Attention and Perception: He does not perceive auditory or visual hallucinations.        Mood and Affect: Mood is depressed. Mood is not anxious.        Speech: Speech is delayed. Speech is not slurred.        Behavior: Behavior is slowed. Behavior is cooperative.        Thought Content: Thought content is not paranoid or delusional. Thought content does not include homicidal or suicidal ideation.        Cognition and Memory: Cognition is impaired. Memory is impaired.     Comments: Attention is fair.   Worsening mood over health. He does not recall my name that he knows he should recall it because he was told before he came.  He is not oriented to this specific date.  He is oriented to the situation. Insight and judgment are fair and limited because of cognitive impairment     Lab Review:     Component Value Date/Time   NA 143 07/28/2019 1529  K 3.7 07/28/2019 1529   CL 103 07/28/2019 1529   CO2 28 07/28/2019 1529   GLUCOSE 83 07/28/2019 1529   GLUCOSE 97 09/29/2018 1133   BUN 18 07/28/2019 1529   CREATININE 0.81 07/28/2019 1529   CREATININE 0.82 08/06/2017 1554   CALCIUM 9.4 07/28/2019 1529   PROT 6.5 09/29/2018 1133   ALBUMIN 4.1 09/29/2018 1133   AST 39 (H) 09/29/2018 1133   ALT 31 09/29/2018 1133   ALKPHOS 38 (L) 09/29/2018 1133   BILITOT 0.7 09/29/2018 1133   GFRNONAA 82 07/28/2019 1529   GFRAA 95 07/28/2019  1529       Component Value Date/Time   WBC 4.4 09/29/2018 1133   RBC 4.57 09/29/2018 1133   HGB 14.2 09/29/2018 1133   HCT 42.1 09/29/2018 1133   PLT 195.0 09/29/2018 1133   MCV 92.1 09/29/2018 1133   MCH 30.0 07/29/2018 1903   MCHC 33.8 09/29/2018 1133   RDW 15.1 09/29/2018 1133   LYMPHSABS 1.3 09/29/2018 1133   MONOABS 0.5 09/29/2018 1133   EOSABS 0.1 09/29/2018 1133   BASOSABS 0.0 09/29/2018 1133    No results found for: POCLITH, LITHIUM   No results found for: PHENYTOIN, PHENOBARB, VALPROATE, CBMZ   .res Assessment: Plan:    Braxton was seen today for follow-up, altered mental status, memory loss, depression and fatigue.  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  He still has low appetite and significant depression and residual anxiety.  They are both satisfied with the results with meds for his anxiety and depression.  They are under control at this time.  continue Lexapro 20 mg daily for depression and anxiety Consider switch to duloxetine for depression  Continue vs stop quetiapine as currently taking 25 mg daily.  Vs stopping it bc it' is of questionable benefit at this time.  Still can ruminate on need to go to the hospital.  We discussed side effects including the potential for worsening Parkinson's symptoms.  Discussed potential metabolic side effects associated with atypical antipsychotics, as well as potential risk for movement side effects. Advised pt to contact office if movement side effects occur.   Trial increase mirtazapine to 45 mg HS for depression. If this fails then switch to duloxetine in hopes of better response for depression but prognosis is guarded DT advance PD.  They are currently seeing an nurse practitioner managing his  Parkinson's meds.  His Parkinson's disease is advancing.  I \ Follow-up in 2 months  Meredith Staggers, MD, DFAPA   Please see After Visit Summary for patient specific instructions.  Increase mirtazapine to 45 mg nightly to help depression.  After 2 weeks, if there are no side effects of the change, then stop quetiapine to see if energy and focus are better.  If anxiety gets worse then call  Meredith Staggers, MD, DFAPA   Future Appointments  Date Time Provider Department Center  10/27/2019  3:15 PM Duke Salvia, MD [facility]    No orders of the defined types were placed in this encounter.   -------------------------------

## 2019-09-07 NOTE — Patient Instructions (Signed)
Increase mirtazapine to 45 mg nightly to help depression.  After 2 weeks, if there are no side effects of the change, then stop quetiapine to see if energy and focus are better.  If anxiety gets worse then call

## 2019-09-15 ENCOUNTER — Telehealth: Payer: Self-pay | Admitting: Psychiatry

## 2019-09-15 NOTE — Telephone Encounter (Signed)
Patient's wife called and said that Stephen Gentry has been on 45 mg of the mirtazapine since his last visit with [name] which was 6/30. Since that time he seems worse. He has increased agitation, PAIN IN THE ABDOMEN, BRAIN FOG AND LACK OF APPETITTE. All of  His other meds are the same so she doesn't know if this is from the increase of the mirtazapine or if it is the parkinsons. Please give her a call back at [phone]

## 2019-09-16 NOTE — Telephone Encounter (Signed)
Cut mirtazapine in 1/2 and that should help.

## 2019-09-16 NOTE — Telephone Encounter (Signed)
Spoke with Stephen Gentry, she didn't pick up the 45 mg mirtazapine yet just been giving a 30 mg and a 15 mg. Advised her she could go back down to 30 mg as was the increase a few weeks ago or just give him the 15 mg. She said she will see how he does. She appreciates the recommendation. Said it's so hard to tell because he just has so much medical that bothers him too.

## 2019-10-24 ENCOUNTER — Other Ambulatory Visit: Payer: Self-pay

## 2019-10-24 MED ORDER — APIXABAN 5 MG PO TABS: 5.0000 mg | ORAL_TABLET | Freq: Two times a day (BID) | ORAL | 6 refills | Status: AC

## 2019-10-24 NOTE — Telephone Encounter (Signed)
Pt's age 85, wt 75.3 kg, SCr 0.81, CrCl 73.6, last ov w/ SK 08/11/19.

## 2019-10-27 ENCOUNTER — Telehealth (INDEPENDENT_AMBULATORY_CARE_PROVIDER_SITE_OTHER): Payer: Medicare Other | Admitting: Internal Medicine

## 2019-10-27 ENCOUNTER — Other Ambulatory Visit: Payer: Self-pay

## 2019-10-27 VITALS — BP 112/73 | HR 105 | Ht 73.0 in | Wt 161.5 lb

## 2019-10-27 DIAGNOSIS — I951 Orthostatic hypotension: Secondary | ICD-10-CM

## 2019-10-27 DIAGNOSIS — I4892 Unspecified atrial flutter: Secondary | ICD-10-CM

## 2019-10-27 NOTE — Patient Instructions (Addendum)
Medication Instructions:   Talk to Dr Shawnee Knapp about the possibility of increasing Florinef.  Continue other medications as prescribed.  *If you need a refill on your cardiac medications before your next appointment, please call your pharmacy*   Lab Work: None ordered.    If you have labs (blood work) drawn today and your tests are completely normal, you will receive your results only by: [name] MyChart Message (if you have MyChart) OR . A paper copy in the mail If you have any lab test that is abnormal or we need to change your treatment, we will call you to review the results.   Testing/Procedures: None ordered.    Follow-Up: 3 month Virtual Visit with Dr Graciela Husbands  At [facility], you and your health needs are our priority.  As part of our continuing mission to provide you with exceptional heart care, we have created designated Provider Care Teams.  These Care Teams include your primary Cardiologist (physician) and Advanced Practice Providers (APPs -  Physician Assistants and Nurse Practitioners) who all work together to provide you with the care you need, when you need it.  We recommend signing up for the patient portal called "MyChart".  Sign up information is provided on this After Visit Summary.  MyChart is used to connect with patients for Virtual Visits (Telemedicine).  Patients are able to view lab/test results, encounter notes, upcoming appointments, etc.  Non-urgent messages can be sent to your provider as well.   To learn more about what you can do with MyChart, go to [email or web address].
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
## 2019-11-10 ENCOUNTER — Ambulatory Visit: Payer: Medicare Other | Admitting: Psychiatry

## 2019-11-10 ENCOUNTER — Encounter: Payer: Self-pay | Admitting: Psychiatry

## 2019-11-10 ENCOUNTER — Other Ambulatory Visit: Payer: Self-pay

## 2019-11-10 ENCOUNTER — Ambulatory Visit (INDEPENDENT_AMBULATORY_CARE_PROVIDER_SITE_OTHER): Payer: Medicare Other | Admitting: Psychiatry

## 2019-11-10 DIAGNOSIS — F0280 Dementia in other diseases classified elsewhere without behavioral disturbance: Secondary | ICD-10-CM | POA: Diagnosis not present

## 2019-11-10 DIAGNOSIS — G20 Parkinson's disease: Secondary | ICD-10-CM

## 2019-11-10 DIAGNOSIS — F331 Major depressive disorder, recurrent, moderate: Secondary | ICD-10-CM

## 2019-11-10 MED ORDER — MIRTAZAPINE 45 MG PO TABS: 22.5000 mg | ORAL_TABLET | Freq: Every day | ORAL | 1 refills | Status: DC

## 2019-11-10 MED ORDER — PAROXETINE HCL 30 MG PO TABS: ORAL_TABLET | ORAL | 1 refills | Status: DC

## 2019-11-10 NOTE — Progress Notes (Signed)
Stephen Gentry [id number] 07-Feb-1935 85 y.o.  Subjective:   Patient ID:  Stephen Gentry is a 15 y.o. (DOB Oct 15, 1934) male.  Chief Complaint:  Chief Complaint  Patient presents with  . Follow-up    Medication Management  . Depression    Medication Management  . Stress    PD    Depression        Associated symptoms include decreased concentration, fatigue and appetite change.  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  Seen with wife Stephen Gentry.  His first visit was September 06, 2018.  For the symptoms of anxiety, dread and obsessing over dying associated with some psychotic symptoms we altered dosing of quetiapine to half of a 25 mg tablet twice daily for 4 to 5 days and if not effective increase to 1 tablet twice daily if needed.  He was to continue on the Lexapro 20 mg daily but if it failed to adequately manage his anxiety there was consideration of switching to paroxetine although there are greater drug interaction issues with that medicine.  visit  November 08, 2018.  Lexapro 20 mg was continued as was quetiapine between 25 and 50 mg daily.  Mirtazapine 15 mg was added. Per wife mirtazapine 15 mg with a much improved appetite and weight.  "Wonder drug". Less bloating with Colace and Senna.  at visit October 2020.  No meds were changed.  March 2021 visit the following is noted: Just had Holter monitor.  Pending eval for afib. Overall health worse with a lot of bad days with health inluding heart and PD.  Weakness and exhaustion easily.  SOB.  Some depression over things he can't do. Sleep good. Not real good bc PD makes it difficult to do things.  Wonders about tthe future.  Depressed bc nature  Of the disease.  Abd pain off and on but seems more settled.  Sleep pretty good. Staying up until 9 and then wonders if he took pills and get OOB.  Sleeps about 12 hours.  They don't sleep  together bc of dogs. No much anxiety and no longer pacing.  Not asking to go to the ER like before meds.      Plan:Trial increase mirtazapine to 30 mg HS .  It helped really quickly with appetite but he is progressively somewhat worse.    09/07/2019 appointment the following is noted: He still feels bad every day.  Fatigue, brain "fog".  Will say he needs to be in the hospital.  Tremor and bloating.  Appetite not better. Wt 163#  Reduced quetiapine to 25 mg HS per neuro bc of low blood pressure.  Not sure if he's more dysphoric but might be.  Blood pressure maybe better.   Not pacing. He can't follow TV show with plot. Incrase mirtazapine to 45 mg HS  TC from wife: Patient's wife called and said that Stephen Gentry has been on 45 mg of the mirtazapine since his last visit with [name] which was 6/30. Since that time he seems worse. He has increased agitation, PAIN IN THE ABDOMEN, BRAIN FOG AND LACK OF APPETITTE. All of  His other meds are the same so she doesn't know if this is from the increase of the mirtazapine or if it is the parkinsons. Please give her a call back at [phone] Response: Spoke with Stephen Evans, she didn't pick up the 45 mg mirtazapine yet just  been giving a 30 mg and a 15 mg. Advised her she could go back down to 30 mg as was the increase a few weeks ago or just give him the 15 mg. She said she will see how he does. She appreciates the recommendation. Said it's so hard to tell because he just has so much medical that bothers him too.   11/10/19 appt with the following noted: Seen with wife Wouldn't say real great.  Cycles of feeling bad likely with PD with flu-like sx likely related to PD or meds for it and wants to go to the hospital.  Doesn't remember being told that won't help and argues about it. No hallucinations   Past Psychiatric Medication Trials: On Lexapro for nearly 3 years.   Started quetiapine 25 mg May 2020,  sertraline 50 less effective,  Abilify for 2 weeks without  response. Mirtazapine 30 helped appetite initally  Review of Systems:  Review of Systems  Constitutional: Positive for appetite change, fatigue and unexpected weight change.  Gastrointestinal: Positive for abdominal pain. Negative for constipation.  Neurological: Positive for tremors and weakness.       Shuffling gait and balance problems without change Neuropathy feet and hands  Psychiatric/Behavioral: Positive for decreased concentration and depression. Negative for hallucinations.    Medications: I have reviewed the patient's current medications.  Current Outpatient Medications  Medication Sig Dispense Refill  . apixaban (ELIQUIS) 5 MG TABS tablet Take 1 tablet (5 mg total) by mouth 2 (two) times daily. 60 tablet 6  . B Complex-C (SUPER B COMPLEX PO) Take 1 tablet by mouth daily.    . Carbidopa-Levodopa ER (SINEMET CR) 25-100 MG tablet controlled release Take 8 tablets by mouth as directed. 2 tablets at 6AM, 2 tablets at 11AM, 2 tablets at 4PM, 1 tablet at bedtime, and 1 tablet at 1AM    . Cholecalciferol 50 MCG (2000 UT) CAPS Take 2,000 Units by mouth daily.     . cyanocobalamin (,VITAMIN B-12,) 1000 MCG/ML injection Inject 1,000 mcg into the muscle every 30 (thirty) days.    . fludrocortisone (FLORINEF) 0.1 MG tablet Take 2 tablets (0.2 mg total) by mouth 2 (two) times daily. 360 tablet 3  . midodrine (PROAMATINE) 10 MG tablet Take 1 tablet (10 mg) at 7 AM, 11 AM, and 3 PM 270 tablet 3  . polyethylene glycol (MIRALAX / GLYCOLAX) 17 g packet Take 17 g by mouth 2 (two) times daily.    . potassium chloride (KLOR-CON) 10 MEQ tablet Take 3 tablets (30 mEq total) by mouth daily. 270 tablet 3  . QUEtiapine (SEROQUEL) 25 MG tablet TAKE 1 TABLET BY MOUTH TWICE A DAY (Patient taking differently: Take 25 mg by mouth at bedtime. Reduced early June bc low blood pressure) 180 tablet 1  . rivastigmine (EXELON) 4.6 mg/24hr Place 4.6 mg onto the skin daily.    [name] senna (SENOKOT) 8.6 MG TABS tablet  Take 2 tablets by mouth daily.    . mirtazapine (REMERON) 45 MG tablet Take 0.5 tablets (22.5 mg total) by mouth at bedtime. 15 tablet 1  . PARoxetine (PAXIL) 30 MG tablet 1/2 tablet at night for 1 week then 1 tablet nightly 30 tablet 1   No current facility-administered medications for this visit.    Medication Side Effects: None  Allergies:  Allergies  Allergen Reactions  . Lorazepam Other (See Comments)    Combative, foul language. Wife doesn't want patient to have again  . Tetracyclines & Related Anaphylaxis  .  Amitiza [Lubiprostone] Nausea Only    Past Medical History:  Diagnosis Date  . Alzheimer's dementia (HCC)   . Anemia   . Anticoagulant long-term use    xarelto  . Anxiety   . Arthritis   . Autonomic dysfunction   . Baker's cyst    right knee  . Chronic constipation   . Diverticulosis   . Internal hemorrhoids   . Nocturia   . Numbness and tingling in right hand    since tablesaw injury 2002  . Numbness and tingling of both feet    toes  . Orthostatic hypotension   . PAF (paroxysmal atrial fibrillation) [facility](HCC)    cardiologist-- dr Graciela Husbandsklein  . Parkinson's disease [facility](HCC)    neurologist-- dr tat  (idiopathic,  notes in epic)  . Tremor, essential   . Wears glasses     Family History  Problem Relation Age of Onset  . Lung cancer Mother   . Lung cancer Father   . Colon cancer Neg Hx   . Esophageal cancer Neg Hx   . Pancreatic cancer Neg Hx   . Stomach cancer Neg Hx   . Liver disease Neg Hx   . Rectal cancer Neg Hx     Social History   Socioeconomic History  . Marital status: Married    Spouse name: Stephen Gentry  . Number of children: 3  . Years of education: Not on file  . Highest education level: Not on file  Occupational History  . Occupation: retired    Comment: Public house managercollege professor geology  . Occupation: retired    Comment: Medical sales representativeoceanographer  Tobacco Use  . Smoking status: Former Smoker    Years: 3.00    Types: Cigarettes    Quit date: 10/23/1956     Years since quitting: 63.0  . Smokeless tobacco: Never Used  Vaping Use  . Vaping Use: Never used  Substance and Sexual Activity  . Alcohol use: Not Currently    Alcohol/week: 0.0 standard drinks  . Drug use: Never  . Sexual activity: Not on file  Other Topics Concern  . Not on file  Social History Narrative  . Not on file   Social Determinants of Health   Financial Resource Strain:   . Difficulty of Paying Living Expenses: Not on file  Food Insecurity:   . Worried About Programme researcher, broadcasting/film/videounning Out of Food in the Last Year: Not on file  . Ran Out of Food in the Last Year: Not on file  Transportation Needs:   . Lack of Transportation (Medical): Not on file  . Lack of Transportation (Non-Medical): Not on file  Physical Activity:   . Days of Exercise per Week: Not on file  . Minutes of Exercise per Session: Not on file  Stress:   . Feeling of Stress : Not on file  Social Connections:   . Frequency of Communication with Friends and Family: Not on file  . Frequency of Social Gatherings with Friends and Family: Not on file  . Attends Religious Services: Not on file  . Active Member of Clubs or Organizations: Not on file  . Attends BankerClub or Organization Meetings: Not on file  . Marital Status: Not on file  Intimate Partner Violence:   . Fear of Current or Ex-Partner: Not on file  . Emotionally Abused: Not on file  . Physically Abused: Not on file  . Sexually Abused: Not on file    Past Medical History, Surgical history, Social history, and Family history were reviewed and updated  as appropriate.   Please see review of systems for further details on the patient's review from today.   Objective:   Physical Exam:  There were no vitals taken for this visit.  Physical Exam Constitutional:      Appearance: He is ill-appearing.  Neurological:     Mental Status: He is alert.     Motor: Weakness and tremor present.     Gait: Gait abnormal.     Comments: Shuffling gait and balance problems  without change  Psychiatric:        Attention and Perception: He does not perceive auditory or visual hallucinations.        Mood and Affect: Mood is anxious and depressed.        Speech: Speech is delayed. Speech is not slurred.        Behavior: Behavior is slowed. Behavior is cooperative.        Thought Content: Thought content is not paranoid or delusional. Thought content does not include homicidal or suicidal ideation.        Cognition and Memory: Cognition is impaired. Memory is impaired.     Comments: Attention is fair.   Worsening mood over health. He does not recall my name that he knows he should recall it because he was told before he came.  He is not oriented to this specific date.  He is oriented to the situation. Insight and judgment are fair and limited because of cognitive impairment     Lab Review:     Component Value Date/Time   NA 143 07/28/2019 1529   K 3.7 07/28/2019 1529   CL 103 07/28/2019 1529   CO2 28 07/28/2019 1529   GLUCOSE 83 07/28/2019 1529   GLUCOSE 97 09/29/2018 1133   BUN 18 07/28/2019 1529   CREATININE 0.81 07/28/2019 1529   CREATININE 0.82 08/06/2017 1554   CALCIUM 9.4 07/28/2019 1529   PROT 6.5 09/29/2018 1133   ALBUMIN 4.1 09/29/2018 1133   AST 39 (H) 09/29/2018 1133   ALT 31 09/29/2018 1133   ALKPHOS 38 (L) 09/29/2018 1133   BILITOT 0.7 09/29/2018 1133   GFRNONAA 82 07/28/2019 1529   GFRAA 95 07/28/2019 1529       Component Value Date/Time   WBC 4.4 09/29/2018 1133   RBC 4.57 09/29/2018 1133   HGB 14.2 09/29/2018 1133   HCT 42.1 09/29/2018 1133   PLT 195.0 09/29/2018 1133   MCV 92.1 09/29/2018 1133   MCH 30.0 07/29/2018 1903   MCHC 33.8 09/29/2018 1133   RDW 15.1 09/29/2018 1133   LYMPHSABS 1.3 09/29/2018 1133   MONOABS 0.5 09/29/2018 1133   EOSABS 0.1 09/29/2018 1133   BASOSABS 0.0 09/29/2018 1133    No results found for: POCLITH, LITHIUM   No results found for: PHENYTOIN, PHENOBARB, VALPROATE, CBMZ   .res Assessment:  Plan:    Daimien was seen today for follow-up, depression and stress.  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 -     PARoxetine (PAXIL) 30 MG tablet; 1/2 tablet at night for 1 week then 1 tablet nightly    The increase in quetiapine has been helpful at reducing some of the anxiety and rumination and pacing.  He still has low appetite  and significant depression and residual anxiety.  They are both satisfied with the results with meds for his anxiety and depression.  They are under control at this time.  Continue vs stop quetiapine as currently taking 25 mg daily.  Vs stopping it bc it' is of questionable benefit at this time.  Still can ruminate on need to go to the hospital.  We discussed side effects including the potential for worsening Parkinson's symptoms.  Discussed potential metabolic side effects associated with atypical antipsychotics, as well as potential risk for movement side effects. Advised pt to contact office if movement side effects occur.   Reduce mirtazapine to 22.5 mg HS to help appetite and reduce agitation Switch lexapro to paroxetine 30 mg to help with anxious depression and appetite.  They are currently seeing an nurse practitioner managing his Parkinson's meds.  His Parkinson's disease is advancing.  I \ Follow-up in 2 months  Meredith Staggers, MD, DFAPA   Please see After Visit Summary for patient specific instructions.  Increase mirtazapine to 45 mg nightly to help depression.  After 2 weeks, if there are no side effects of the change, then stop quetiapine to see if energy and focus are better.  If anxiety gets worse then call  Meredith Staggers, MD, DFAPA   No future appointments.  No orders of  the defined types were placed in this encounter.   -------------------------------

## 2019-11-10 NOTE — Patient Instructions (Signed)
Change mirtazapine to one half of a 45 mg tablet at night  Reduce Lexapro to 1/2 tablet at night with 1/2 tablet of paroxetine for 1 week, Then stop Lexapro and increase paroxetine to 1 tablet at night

## 2019-12-03 ENCOUNTER — Other Ambulatory Visit: Payer: Self-pay | Admitting: Psychiatry

## 2019-12-13 ENCOUNTER — Telehealth: Payer: Self-pay

## 2019-12-13 ENCOUNTER — Other Ambulatory Visit: Payer: Self-pay | Admitting: Psychiatry

## 2019-12-13 ENCOUNTER — Telehealth: Payer: Self-pay | Admitting: Psychiatry

## 2019-12-13 MED ORDER — PAROXETINE HCL 30 MG PO TABS: 30.0000 mg | ORAL_TABLET | Freq: Every day | ORAL | 0 refills | Status: AC

## 2019-12-13 NOTE — Telephone Encounter (Signed)
Contacted his pharmacy and it's not a pharmacy issue it's an insurance issue. [employer] won't let it go through until we call and okay it and tell them he's no longer on escitalopram. So I will contact them at [phone]  ID# [id number]

## 2019-12-13 NOTE — Telephone Encounter (Signed)
Contacted [employer] at correct # [phone] but they instructed that it just needs a prior authorization submitted through cover my meds. Initiated a PA and sent to [facility] for urgent.

## 2019-12-13 NOTE — Telephone Encounter (Signed)
Pt was weaned off ESCITALOPRAM (Lexapro) and Rx for PAROXETINE (Paxil) 30 mg tablets was sent 12/06/19 stating "STOP ESCITALOPRAM". Please call [facility] to get this straight so Pt can get Rx for Paxil filled. Has been working on getting filled since Friday. Pharmacy stating has code taking both meds and will not fill Paxil.

## 2019-12-13 NOTE — Telephone Encounter (Signed)
Prior approval received effective 03/11/2019-03/09/2020 with [facility]   Pharmacy and patient notified

## 2019-12-13 NOTE — Telephone Encounter (Signed)
Sent in corrected RX which should solve the problem and asked them to fill it today

## 2019-12-13 NOTE — Telephone Encounter (Signed)
I will contact pharmacy as soon as I can for clarification.

## 2019-12-26 NOTE — Telephone Encounter (Signed)
See previous phone message. 

## 2020-01-11 ENCOUNTER — Ambulatory Visit (INDEPENDENT_AMBULATORY_CARE_PROVIDER_SITE_OTHER): Payer: Medicare Other | Admitting: Psychiatry

## 2020-01-11 ENCOUNTER — Other Ambulatory Visit: Payer: Self-pay

## 2020-01-11 ENCOUNTER — Encounter: Payer: Self-pay | Admitting: Psychiatry

## 2020-01-11 DIAGNOSIS — F0280 Dementia in other diseases classified elsewhere without behavioral disturbance: Secondary | ICD-10-CM | POA: Diagnosis not present

## 2020-01-11 DIAGNOSIS — G20A1 Parkinson's disease without dyskinesia, without mention of fluctuations: Secondary | ICD-10-CM

## 2020-01-11 DIAGNOSIS — G20 Parkinson's disease: Secondary | ICD-10-CM

## 2020-01-11 DIAGNOSIS — F331 Major depressive disorder, recurrent, moderate: Secondary | ICD-10-CM

## 2020-01-11 DIAGNOSIS — F320 Major depressive disorder, single episode, mild: Secondary | ICD-10-CM

## 2020-01-11 DIAGNOSIS — F32A Depression, unspecified: Secondary | ICD-10-CM

## 2020-01-11 MED ORDER — MIRTAZAPINE 30 MG PO TABS: 30.0000 mg | ORAL_TABLET | Freq: Every day | ORAL | 3 refills | Status: DC

## 2020-01-11 NOTE — Patient Instructions (Signed)
Stop quetiapine  Switch paroxetine 30 mg to night time  Continue mirtazapine 30 mg at night.

## 2020-01-11 NOTE — Progress Notes (Signed)
Stephen Gentry [id number] 02-07-1935 85 y.o.  Subjective:   Patient ID:  Stephen Gentry is a 89 y.o. (DOB [birth year]) male.  Chief Complaint:  Chief Complaint  Patient presents with  . Follow-up  . Anxiety  . Depression  . Hallucinations    Depression        Associated symptoms include decreased concentration, fatigue and appetite change.  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  Seen with wife Stephen Gentry.  His first visit was September 06, 2018.  For the symptoms of anxiety, dread and obsessing over dying associated with some psychotic symptoms we altered dosing of quetiapine to half of a 25 mg tablet twice daily for 4 to 5 days and if not effective increase to 1 tablet twice daily if needed.  He was to continue on the Lexapro 20 mg daily but if it failed to adequately manage his anxiety there was consideration of switching to paroxetine although there are greater drug interaction issues with that medicine.  visit  November 08, 2018.  Lexapro 20 mg was continued as was quetiapine between 25 and 50 mg daily.  Mirtazapine 15 mg was added. Per wife mirtazapine 15 mg with a much improved appetite and weight.  "Wonder drug". Less bloating with Colace and Senna.  at visit October 2020.  No meds were changed.  March 2021 visit the following is noted: Just had Holter monitor.  Pending eval for afib. Overall health worse with a lot of bad days with health inluding heart and PD.  Weakness and exhaustion easily.  SOB.  Some depression over things he can't do. Sleep good. Not real good bc PD makes it difficult to do things.  Wonders about tthe future.  Depressed bc nature  Of the disease.  Abd pain off and on but seems more settled.  Sleep pretty good. Staying up until 9 and then wonders if he took pills and get OOB.  Sleeps about 12 hours.  They don't sleep together bc of dogs. No much anxiety and  no longer pacing.  Not asking to go to the ER like before meds.      Plan:Trial increase mirtazapine to 30 mg HS .  It helped really quickly with appetite but he is progressively somewhat worse.    09/07/2019 appointment the following is noted: He still feels bad every day.  Fatigue, brain "fog".  Will say he needs to be in the hospital.  Tremor and bloating.  Appetite not better. Wt 163#  Reduced quetiapine to 25 mg HS per neuro bc of low blood pressure.  Not sure if he's more dysphoric but might be.  Blood pressure maybe better.   Not pacing. He can't follow TV show with plot. Incrase mirtazapine to 45 mg HS  TC from wife: Patient's wife called and said that [place] has been on 45 mg of the mirtazapine since his last visit with [name] which was 6/30. Since that time he seems worse. He has increased agitation, PAIN IN THE ABDOMEN, BRAIN FOG AND LACK OF APPETITTE. All of  His other meds are the same so she doesn't know if this is from the increase of the mirtazapine or if it is the parkinsons. Please give her a call back at [phone] Response: Spoke with Stephen Gentry, she didn't pick up the 45 mg mirtazapine yet just been giving a 30 mg and a 15 mg. Advised her  she could go back down to 30 mg as was the increase a few weeks ago or just give him the 15 mg. She said she will see how he does. She appreciates the recommendation. Said it's so hard to tell because he just has so much medical that bothers him too.   11/10/19 appt with the following noted: Seen with wife Wouldn't say real great.  Cycles of feeling bad likely with PD with flu-like sx likely related to PD or meds for it and wants to go to the hospital.  Doesn't remember being told that won't help and argues about it. No hallucinations Plan: Reduce mirtazapine to 22.5 mg HS to help appetite and reduce agitation Switch lexapro to paroxetine 30 mg to help with anxious depression and appetite. Option stop quetiapine  01/11/2020 appointment with  the following noted:  Seen with wife who provides history.. Moved to asst living [name] 12-31-19.  PCP had Covid.  PA of the PCP filled out FL2 incorrectly.   Wife reviewed the med list and found mistakes.  Example taking the 30 mg mirtazapine.  Also taking the quetiapine 25 mg AM.  Paroxetine 30 AM. Holding his own on his weight. Yesterday 156#. Tomorrow appt with neurologist at [facility]. A little less anxious and perhaps better appetite with switch from Lexapro to paroxetine.   Apparently less ruminating on the need to go to the hospital.  Past Psychiatric Medication Trials: On Lexapro for nearly 3 years.   Started quetiapine 25 mg May 2020,  sertraline 50 less effective,  Abilify for 2 weeks without response. Mirtazapine 30 helped appetite initally  Review of Systems:  Review of Systems  Constitutional: Positive for appetite change, fatigue and unexpected weight change.  Gastrointestinal: Positive for abdominal pain and constipation.  Neurological: Positive for tremors and weakness.       Shuffling gait and balance problems without change Neuropathy feet and hands  Psychiatric/Behavioral: Positive for decreased concentration and depression. Negative for hallucinations.    Medications: I have reviewed the patient's current medications.  Current Outpatient Medications  Medication Sig Dispense Refill  . apixaban (ELIQUIS) 5 MG TABS tablet Take 1 tablet (5 mg total) by mouth 2 (two) times daily. 60 tablet 6  . B Complex-C (SUPER B COMPLEX PO) Take 1 tablet by mouth daily.    . Carbidopa-Levodopa ER (SINEMET CR) 25-100 MG tablet controlled release Take 8 tablets by mouth as directed. 2 tablets at 6AM, 2 tablets at 11AM, 2 tablets at 4PM, 1 tablet at bedtime, and 1 tablet at 1AM    . Cholecalciferol 50 MCG (2000 UT) CAPS Take 2,000 Units by mouth daily.     . cyanocobalamin (,VITAMIN B-12,) 1000 MCG/ML injection Inject 1,000 mcg into the muscle every 30 (thirty) days.    .  fludrocortisone (FLORINEF) 0.1 MG tablet Take 2 tablets (0.2 mg total) by mouth 2 (two) times daily. 360 tablet 3  . midodrine (PROAMATINE) 10 MG tablet Take 1 tablet (10 mg) at 7 AM, 11 AM, and 3 PM 270 tablet 3  . PARoxetine (PAXIL) 30 MG tablet Take 1 tablet (30 mg total) by mouth daily. 90 tablet 0  . polyethylene glycol (MIRALAX / GLYCOLAX) 17 g packet Take 17 g by mouth 2 (two) times daily.    . potassium chloride (KLOR-CON) 10 MEQ tablet Take 3 tablets (30 mEq total) by mouth daily. 270 tablet 3  . rivastigmine (EXELON) 4.6 mg/24hr Place 4.6 mg onto the skin daily.    [name] senna (  SENOKOT) 8.6 MG TABS tablet Take 2 tablets by mouth daily.    . mirtazapine (REMERON) 30 MG tablet Take 1 tablet (30 mg total) by mouth at bedtime. 30 tablet 3   No current facility-administered medications for this visit.    Medication Side Effects: None  Allergies:  Allergies  Allergen Reactions  . Lorazepam Other (See Comments)    Combative, foul language. Wife doesn't want patient to have again  . Tetracyclines & Related Anaphylaxis  . Amitiza [Lubiprostone] Nausea Only    Past Medical History:  Diagnosis Date  . Alzheimer's dementia (HCC)   . Anemia   . Anticoagulant long-term use    xarelto  . Anxiety   . Arthritis   . Autonomic dysfunction   . Baker's cyst    right knee  . Chronic constipation   . Diverticulosis   . Internal hemorrhoids   . Nocturia   . Numbness and tingling in right hand    since tablesaw injury 2002  . Numbness and tingling of both feet    toes  . Orthostatic hypotension   . PAF (paroxysmal atrial fibrillation) [facility])    cardiologist-- dr Graciela Husbands  . Parkinson's disease [facility])    neurologist-- dr tat  (idiopathic,  notes in epic)  . Tremor, essential   . Wears glasses     Family History  Problem Relation Age of Onset  . Lung cancer Mother   . Lung cancer Father   . Colon cancer Neg Hx   . Esophageal cancer Neg Hx   . Pancreatic cancer Neg Hx   . Stomach  cancer Neg Hx   . Liver disease Neg Hx   . Rectal cancer Neg Hx     Social History   Socioeconomic History  . Marital status: Married    Spouse name: Stephen Gentry  . Number of children: 3  . Years of education: Not on file  . Highest education level: Not on file  Occupational History  . Occupation: retired    Comment: Public house manager  . Occupation: retired    Comment: Medical sales representative  Tobacco Use  . Smoking status: Former Smoker    Years: 3.00    Types: Cigarettes    Quit date: 10/23/1956    Years since quitting: 63.2  . Smokeless tobacco: Never Used  Vaping Use  . Vaping Use: Never used  Substance and Sexual Activity  . Alcohol use: Not Currently    Alcohol/week: 0.0 standard drinks  . Drug use: Never  . Sexual activity: Not on file  Other Topics Concern  . Not on file  Social History Narrative  . Not on file   Social Determinants of Health   Financial Resource Strain:   . Difficulty of Paying Living Expenses: Not on file  Food Insecurity:   . Worried About Programme researcher, broadcasting/film/video in the Last Year: Not on file  . Ran Out of Food in the Last Year: Not on file  Transportation Needs:   . Lack of Transportation (Medical): Not on file  . Lack of Transportation (Non-Medical): Not on file  Physical Activity:   . Days of Exercise per Week: Not on file  . Minutes of Exercise per Session: Not on file  Stress:   . Feeling of Stress : Not on file  Social Connections:   . Frequency of Communication with Friends and Family: Not on file  . Frequency of Social Gatherings with Friends and Family: Not on file  . Attends Religious Services: Not  on file  . Active Member of Clubs or Organizations: Not on file  . Attends Banker Meetings: Not on file  . Marital Status: Not on file  Intimate Partner Violence:   . Fear of Current or Ex-Partner: Not on file  . Emotionally Abused: Not on file  . Physically Abused: Not on file  . Sexually Abused: Not on file     Past Medical History, Surgical history, Social history, and Family history were reviewed and updated as appropriate.   Please see review of systems for further details on the patient's review from today.   Objective:   Physical Exam:  There were no vitals taken for this visit.  Physical Exam Constitutional:      Appearance: He is ill-appearing.  Neurological:     Mental Status: He is alert.     Motor: Weakness and tremor present.     Gait: Gait abnormal.     Comments: Shuffling gait and balance problems without change  Psychiatric:        Attention and Perception: He does not perceive auditory or visual hallucinations.        Mood and Affect: Mood is anxious and depressed.        Speech: Speech is delayed. Speech is not slurred.        Behavior: Behavior is slowed. Behavior is cooperative.        Thought Content: Thought content is not paranoid or delusional. Thought content does not include homicidal or suicidal ideation.        Cognition and Memory: Cognition is impaired. Memory is impaired.     Comments: Attention is fair.   Worsening mood over health. He does not recall my name that he knows he should recall it because he was told before he came.  He is not oriented to this specific date.  He is oriented to the situation. Insight and judgment are fair and limited because of cognitive impairment tremor     Lab Review:     Component Value Date/Time   NA 143 07/28/2019 1529   K 3.7 07/28/2019 1529   CL 103 07/28/2019 1529   CO2 28 07/28/2019 1529   GLUCOSE 83 07/28/2019 1529   GLUCOSE 97 09/29/2018 1133   BUN 18 07/28/2019 1529   CREATININE 0.81 07/28/2019 1529   CREATININE 0.82 08/06/2017 1554   CALCIUM 9.4 07/28/2019 1529   PROT 6.5 09/29/2018 1133   ALBUMIN 4.1 09/29/2018 1133   AST 39 (H) 09/29/2018 1133   ALT 31 09/29/2018 1133   ALKPHOS 38 (L) 09/29/2018 1133   BILITOT 0.7 09/29/2018 1133   GFRNONAA 82 07/28/2019 1529   GFRAA 95 07/28/2019 1529        Component Value Date/Time   WBC 4.4 09/29/2018 1133   RBC 4.57 09/29/2018 1133   HGB 14.2 09/29/2018 1133   HCT 42.1 09/29/2018 1133   PLT 195.0 09/29/2018 1133   MCV 92.1 09/29/2018 1133   MCH 30.0 07/29/2018 1903   MCHC 33.8 09/29/2018 1133   RDW 15.1 09/29/2018 1133   LYMPHSABS 1.3 09/29/2018 1133   MONOABS 0.5 09/29/2018 1133   EOSABS 0.1 09/29/2018 1133   BASOSABS 0.0 09/29/2018 1133    No results found for: POCLITH, LITHIUM   No results found for: PHENYTOIN, PHENOBARB, VALPROATE, CBMZ   .res Assessment: Plan:    Azhar was seen today for follow-up, anxiety, depression and hallucinations.  Diagnoses and all orders for this visit:  Major depressive disorder, recurrent episode, moderate (  HCC) -     mirtazapine (REMERON) 30 MG tablet; Take 1 tablet (30 mg total) by mouth at bedtime.  Dementia associated with Parkinson's disease (HCC)  Mild depression (HCC)    Anxiety and depression and appetite are better with switch from escitalopram to paroxetine 30 mg daily and he seems to be tolerating it well.  stop quetiapine as currently taking 25 mg dailyt bc it' is of questionable benefit at this time and it can also contribute to tiredness and constipation..  Still can ruminate on need to go to the hospital and call if this gets worse or if the hallucinations recur.  We discussed side effects including the potential for worsening Parkinson's symptoms.  Discussed potential metabolic side effects associated with atypical antipsychotics, as well as potential risk for movement side effects. Advised pt to contact office if movement side effects occur.   Continue mirtazapine 30 mg HS  Change paroxetine 30 mg to night to help with anxious depression and appetite.  [facility] Follow-up in 3 months  Meredith Staggersarey Cottle, MD, DFAPA   Meredith Staggersarey Cottle, MD, DFAPA   Future Appointments  Date Time Provider Department Center  04/13/2020 10:00 AM Duke SalviaKlein, Steven C, MD  [facility]    No orders of the defined types were placed in this encounter.   -------------------------------

## 2020-01-14 ENCOUNTER — Emergency Department (HOSPITAL_COMMUNITY): Payer: Medicare Other

## 2020-01-14 ENCOUNTER — Other Ambulatory Visit: Payer: Self-pay

## 2020-01-14 ENCOUNTER — Emergency Department (HOSPITAL_COMMUNITY)
Admission: EM | Admit: 2020-01-14 | Discharge: 2020-01-15 | Disposition: A | Discharge status: Home / Self Care | Payer: Medicare Other | Attending: Emergency Medicine | Admitting: Emergency Medicine

## 2020-01-14 DIAGNOSIS — Z79899 Other long term (current) drug therapy: Secondary | ICD-10-CM | POA: Insufficient documentation

## 2020-01-14 DIAGNOSIS — R1084 Generalized abdominal pain: Secondary | ICD-10-CM | POA: Diagnosis not present

## 2020-01-14 DIAGNOSIS — G309 Alzheimer's disease, unspecified: Secondary | ICD-10-CM | POA: Diagnosis not present

## 2020-01-14 DIAGNOSIS — I4891 Unspecified atrial fibrillation: Secondary | ICD-10-CM | POA: Diagnosis not present

## 2020-01-14 DIAGNOSIS — R10814 Left lower quadrant abdominal tenderness: Secondary | ICD-10-CM | POA: Diagnosis not present

## 2020-01-14 DIAGNOSIS — K5900 Constipation, unspecified: Secondary | ICD-10-CM | POA: Insufficient documentation

## 2020-01-14 DIAGNOSIS — R10813 Right lower quadrant abdominal tenderness: Secondary | ICD-10-CM | POA: Insufficient documentation

## 2020-01-14 DIAGNOSIS — G20 Parkinson's disease: Secondary | ICD-10-CM | POA: Diagnosis not present

## 2020-01-14 DIAGNOSIS — Z7901 Long term (current) use of anticoagulants: Secondary | ICD-10-CM | POA: Diagnosis not present

## 2020-01-14 DIAGNOSIS — Z87891 Personal history of nicotine dependence: Secondary | ICD-10-CM | POA: Insufficient documentation

## 2020-01-14 DIAGNOSIS — R1030 Lower abdominal pain, unspecified: Secondary | ICD-10-CM | POA: Diagnosis present

## 2020-01-14 LAB — CBC WITH DIFFERENTIAL/PLATELET
Abs Immature Granulocytes: 0.01 10*3/uL (ref 0.00–0.07)
Basophils Absolute: 0 10*3/uL (ref 0.0–0.1)
Basophils Relative: 1 %
Eosinophils Absolute: 0 10*3/uL (ref 0.0–0.5)
Eosinophils Relative: 0 %
HCT: 40.9 % (ref 39.0–52.0)
Hemoglobin: 13.5 g/dL (ref 13.0–17.0)
Immature Granulocytes: 0 %
Lymphocytes Relative: 27 %
Lymphs Abs: 0.9 10*3/uL (ref 0.7–4.0)
MCH: 31 pg (ref 26.0–34.0)
MCHC: 33 g/dL (ref 30.0–36.0)
MCV: 94 fL (ref 80.0–100.0)
Monocytes Absolute: 0.4 10*3/uL (ref 0.1–1.0)
Monocytes Relative: 13 %
Neutro Abs: 2 10*3/uL (ref 1.7–7.7)
Neutrophils Relative %: 59 %
Platelets: 160 10*3/uL (ref 150–400)
RBC: 4.35 MIL/uL (ref 4.22–5.81)
RDW: 13.2 % (ref 11.5–15.5)
WBC: 3.5 10*3/uL — ABNORMAL LOW (ref 4.0–10.5)
nRBC: 0 % (ref 0.0–0.2)

## 2020-01-14 LAB — URINALYSIS, ROUTINE W REFLEX MICROSCOPIC
Bacteria, UA: NONE SEEN
Bilirubin Urine: NEGATIVE
Glucose, UA: NEGATIVE mg/dL
Hgb urine dipstick: NEGATIVE
Ketones, ur: 5 mg/dL — AB
Leukocytes,Ua: NEGATIVE
Nitrite: NEGATIVE
Protein, ur: 30 mg/dL — AB
Specific Gravity, Urine: 1.027 (ref 1.005–1.030)
pH: 5 (ref 5.0–8.0)

## 2020-01-14 LAB — COMPREHENSIVE METABOLIC PANEL
ALT: 12 U/L (ref 0–44)
AST: 17 U/L (ref 15–41)
Albumin: 3.8 g/dL (ref 3.5–5.0)
Alkaline Phosphatase: 57 U/L (ref 38–126)
Anion gap: 8 (ref 5–15)
BUN: 16 mg/dL (ref 8–23)
CO2: 27 mmol/L (ref 22–32)
Calcium: 9.6 mg/dL (ref 8.9–10.3)
Chloride: 105 mmol/L (ref 98–111)
Creatinine, Ser: 0.79 mg/dL (ref 0.61–1.24)
GFR, Estimated: >60 mL/min (ref >60)
Glucose, Bld: 87 mg/dL (ref 70–99)
Potassium: 4 mmol/L (ref 3.5–5.1)
Sodium: 140 mmol/L (ref 135–145)
Total Bilirubin: 0.7 mg/dL (ref 0.3–1.2)
Total Protein: 6.5 g/dL (ref 6.5–8.1)

## 2020-01-14 LAB — LIPASE, BLOOD: Lipase: 69 U/L — ABNORMAL HIGH (ref 11–51)

## 2020-01-14 MED ORDER — IOHEXOL 300 MG/ML  SOLN
100.0000 mL | Freq: Once | INTRAMUSCULAR | Status: AC | PRN
  Administered 2020-01-14: 100 mL via INTRAVENOUS

## 2020-01-14 MED ORDER — SODIUM CHLORIDE 0.9 % IV BOLUS
500.0000 mL | Freq: Once | INTRAVENOUS | Status: AC
  Administered 2020-01-14: 500 mL via INTRAVENOUS

## 2020-01-14 MED ORDER — POLYETHYLENE GLYCOL 3350 17 G PO PACK: 34.0000 g | PACK | Freq: Two times a day (BID) | ORAL | 0 refills | Status: AC

## 2020-01-14 MED ORDER — GLYCERIN (LAXATIVE) 2.1 G RE SUPP
1.0000 | Freq: Once | RECTAL | Status: AC
  Administered 2020-01-14: 1 via RECTAL
  Filled 2020-01-14: qty 1
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
## 2020-01-14 NOTE — ED Triage Notes (Signed)
Pt lives in facility. C/O lower abd pain "across the middle" 4-5 weeks. Last BM yesterday, lg amt after stool preparation. C/O "episodes of constipation. Denies any other complaints

## 2020-01-14 NOTE — ED Notes (Signed)
Patient transported to CT 

## 2020-01-14 NOTE — Discharge Instructions (Addendum)
Give 34g Miralax twice daily as prescribed. Follow up with PCP for further medication adjustment.

## 2020-01-21 ENCOUNTER — Other Ambulatory Visit: Payer: Self-pay | Admitting: Psychiatry

## 2020-04-12 ENCOUNTER — Ambulatory Visit (INDEPENDENT_AMBULATORY_CARE_PROVIDER_SITE_OTHER): Payer: Medicare Other | Admitting: Psychiatry

## 2020-04-12 ENCOUNTER — Encounter: Payer: Self-pay | Admitting: Psychiatry

## 2020-04-12 ENCOUNTER — Other Ambulatory Visit: Payer: Self-pay

## 2020-04-12 DIAGNOSIS — F331 Major depressive disorder, recurrent, moderate: Secondary | ICD-10-CM | POA: Diagnosis not present

## 2020-04-12 DIAGNOSIS — G20 Parkinson's disease: Secondary | ICD-10-CM

## 2020-04-12 DIAGNOSIS — F0280 Dementia in other diseases classified elsewhere without behavioral disturbance: Secondary | ICD-10-CM

## 2020-04-12 NOTE — Progress Notes (Signed)
MELVIN WHITEFORD [id number] 05/22/1934 86 y.o.  Subjective:   Patient ID:  Stephen Gentry is a 84 y.o. (DOB 13-Mar-1934) male.  Chief Complaint:  Chief Complaint  Patient presents with  . Major depressive disorder, recurrent episode, moderate (HCC)  . Follow-up    Depression        Associated symptoms include decreased concentration, fatigue and appetite change.  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  Seen with wife [name].  His first visit was September 06, 2018.  For the symptoms of anxiety, dread and obsessing over dying associated with some psychotic symptoms we altered dosing of quetiapine to half of a 25 mg tablet twice daily for 4 to 5 days and if not effective increase to 1 tablet twice daily if needed.  He was to continue on the Lexapro 20 mg daily but if it failed to adequately manage his anxiety there was consideration of switching to paroxetine although there are greater drug interaction issues with that medicine.  visit  November 08, 2018.  Lexapro 20 mg was continued as was quetiapine between 25 and 50 mg daily.  Mirtazapine 15 mg was added. Per wife mirtazapine 15 mg with a much improved appetite and weight.  "Wonder drug". Less bloating with Colace and Senna.  at visit October 2020.  No meds were changed.  March 2021 visit the following is noted: Just had Holter monitor.  Pending eval for afib. Overall health worse with a lot of bad days with health inluding heart and PD.  Weakness and exhaustion easily.  SOB.  Some depression over things he can't do. Sleep good. Not real good bc PD makes it difficult to do things.  Wonders about tthe future.  Depressed bc nature  Of the disease.  Abd pain off and on but seems more settled.  Sleep pretty good. Staying up until 9 and then wonders if he took pills and get OOB.  Sleeps about 12 hours.  They don't sleep together bc of dogs.  No much anxiety and no longer pacing.  Not asking to go to the ER like before meds.      Plan:Trial increase mirtazapine to 30 mg HS .  It helped really quickly with appetite but he is progressively somewhat worse.    09/07/2019 appointment the following is noted: He still feels bad every day.  Fatigue, brain "fog".  Will say he needs to be in the hospital.  Tremor and bloating.  Appetite not better. Wt 163#  Reduced quetiapine to 25 mg HS per neuro bc of low blood pressure.  Not sure if he's more dysphoric but might be.  Blood pressure maybe better.   Not pacing. He can't follow TV show with plot. Incrase mirtazapine to 45 mg HS  TC from wife: Patient's wife called and said that Yarnell has been on 45 mg of the mirtazapine since his last visit with [name] which was 6/30. Since that time he seems worse. He has increased agitation, PAIN IN THE ABDOMEN, BRAIN FOG AND LACK OF APPETITTE. All of  His other meds are the same so she doesn't know if this is from the increase of the mirtazapine or if it is the parkinsons. Please give her a call back at [phone] Response: Spoke with [name], she didn't pick up the 45 mg mirtazapine yet just been giving a 30 mg and a 15 mg. Advised her  she could go back down to 30 mg as was the increase a few weeks ago or just give him the 15 mg. She said she will see how he does. She appreciates the recommendation. Said it's so hard to tell because he just has so much medical that bothers him too.   11/10/19 appt with the following noted: Seen with wife Wouldn't say real great.  Cycles of feeling bad likely with PD with flu-like sx likely related to PD or meds for it and wants to go to the hospital.  Doesn't remember being told that won't help and argues about it. No hallucinations Plan: Reduce mirtazapine to 22.5 mg HS to help appetite and reduce agitation Switch lexapro to paroxetine 30 mg to help with anxious depression and appetite. Option stop  quetiapine  01/11/2020 appointment with the following noted:  Seen with wife who provides history.. Moved to asst living [name] 12-31-19.  PCP had Covid.  PA of the PCP filled out FL2 incorrectly.   Wife reviewed the med list and found mistakes.  Example taking the 30 mg mirtazapine.  Also taking the quetiapine 25 mg AM.  Paroxetine 30 AM. Holding his own on his weight. Yesterday 156#. Tomorrow appt with neurologist at [facility]. A little less anxious and perhaps better appetite with switch from Lexapro to paroxetine.   Apparently less ruminating on the need to go to the hospital.  Plan: stop quetiapine as currently taking 25 mg daily  04/12/2020 appt noted: Seen with wife Moved to [facility] 03/20/20  after wandering out.  No differences good or bad with stopping quetiapine.   Constant belly pain with constipation for years. DX 2015 PD. Appetite mediocre. Wife things some weight loss.  But she sees him eat fairly well. Sleep pretty well usually. From 9-4 AM.     Depressed over difficulty doing things he'd like.  Not nervous.  Past Psychiatric Medication Trials: On Lexapro for nearly 3 years.   Started quetiapine 25 mg May 2020,  sertraline 50 less effective,  Abilify for 2 weeks without response. Mirtazapine 30 helped appetite initally  Review of Systems:  Review of Systems  Constitutional: Positive for appetite change, fatigue and unexpected weight change.  Gastrointestinal: Positive for abdominal pain and constipation.  Neurological: Positive for tremors and weakness.       Shuffling gait and balance problems without change Neuropathy feet and hands occ falls  Psychiatric/Behavioral: Positive for decreased concentration and depression. Negative for hallucinations.    Medications: I have reviewed the patient's current medications.  Current Outpatient Medications  Medication Sig Dispense Refill  . apixaban (ELIQUIS) 5 MG TABS tablet Take 1 tablet (5 mg total)  by mouth 2 (two) times daily. 60 tablet 6  . B Complex-C (SUPER B COMPLEX PO) Take 1 tablet by mouth daily.    . Cholecalciferol 50 MCG (2000 UT) CAPS Take 2,000 Units by mouth daily.     . cyanocobalamin (,VITAMIN B-12,) 1000 MCG/ML injection Inject 1,000 mcg into the muscle every 30 (thirty) days.    . fludrocortisone (FLORINEF) 0.1 MG tablet Take 2 tablets (0.2 mg total) by mouth 2 (two) times daily. 360 tablet 3  . midodrine (PROAMATINE) 10 MG tablet Take 1 tablet (10 mg) at 7 AM, 11 AM, and 3 PM 270 tablet 3  . mirtazapine (REMERON) 30 MG tablet Take 1 tablet (30 mg total) by mouth at bedtime. 30 tablet 3  . PARoxetine (PAXIL) 30 MG tablet Take 1 tablet (30 mg  total) by mouth daily. 90 tablet 0  . polyethylene glycol (MIRALAX / GLYCOLAX) 17 g packet Take 34 g by mouth 2 (two) times daily. 14 each 0  . potassium chloride (KLOR-CON) 10 MEQ tablet Take 3 tablets (30 mEq total) by mouth daily. 270 tablet 3  . rivastigmine (EXELON) 4.6 mg/24hr Place 4.6 mg onto the skin daily.    [name]. senna (SENOKOT) 8.6 MG TABS tablet Take 2 tablets by mouth daily.    . Carbidopa-Levodopa ER (SINEMET CR) 25-100 MG tablet controlled release Take 7 tablets by mouth as directed. 2 tablets at 6AM, 2 tablets at 11AM, 2 tablets at 4PM, 1 tablet at bedtime     No current facility-administered medications for this visit.    Medication Side Effects: None  Allergies:  Allergies  Allergen Reactions  . Lorazepam Other (See Comments)    Combative, foul language. Wife doesn't want patient to have again  . Tetracyclines & Related Anaphylaxis  . Amitiza [Lubiprostone] Nausea Only    Past Medical History:  Diagnosis Date  . Alzheimer's dementia (HCC)   . Anemia   . Anticoagulant long-term use    xarelto  . Anxiety   . Arthritis   . Autonomic dysfunction   . Baker's cyst    right knee  . Chronic constipation   . Diverticulosis   . Internal hemorrhoids   . Nocturia   . Numbness and tingling in right hand     since tablesaw injury 2002  . Numbness and tingling of both feet    toes  . Orthostatic hypotension   . PAF (paroxysmal atrial fibrillation) [facility](HCC)    cardiologist-- dr Graciela Husbandsklein  . Parkinson's disease [facility](HCC)    neurologist-- dr tat  (idiopathic,  notes in epic)  . Tremor, essential   . Wears glasses     Family History  Problem Relation Age of Onset  . Lung cancer Mother   . Lung cancer Father   . Colon cancer Neg Hx   . Esophageal cancer Neg Hx   . Pancreatic cancer Neg Hx   . Stomach cancer Neg Hx   . Liver disease Neg Hx   . Rectal cancer Neg Hx     Social History   Socioeconomic History  . Marital status: Married    Spouse name: [name]  . Number of children: 3  . Years of education: Not on file  . Highest education level: Not on file  Occupational History  . Occupation: retired    Comment: Public house managercollege professor geology  . Occupation: retired    Comment: Medical sales representativeoceanographer  Tobacco Use  . Smoking status: Former Smoker    Years: 3.00    Types: Cigarettes    Quit date: 10/23/1956    Years since quitting: 63.5  . Smokeless tobacco: Never Used  Vaping Use  . Vaping Use: Never used  Substance and Sexual Activity  . Alcohol use: Not Currently    Alcohol/week: 0.0 standard drinks  . Drug use: Never  . Sexual activity: Not on file  Other Topics Concern  . Not on file  Social History Narrative  . Not on file   Social Determinants of Health   Financial Resource Strain: Not on file  Food Insecurity: Not on file  Transportation Needs: Not on file  Physical Activity: Not on file  Stress: Not on file  Social Connections: Not on file  Intimate Partner Violence: Not on file    Past Medical History, Surgical history, Social history, and Family history were  reviewed and updated as appropriate.   Please see review of systems for further details on the patient's review from today.   Objective:   Physical Exam:  There were no vitals taken for this visit.  Physical  Exam Constitutional:      Appearance: He is ill-appearing.  Neurological:     Mental Status: He is alert.     Motor: Weakness and tremor present.     Gait: Gait abnormal.     Comments: Shuffling gait and balance problems without change  Psychiatric:        Attention and Perception: He does not perceive auditory or visual hallucinations.        Mood and Affect: Mood is anxious and depressed.        Speech: Speech is delayed. Speech is not slurred.        Behavior: Behavior is slowed. Behavior is cooperative.        Thought Content: Thought content is not paranoid or delusional. Thought content does not include homicidal or suicidal ideation.        Cognition and Memory: Cognition is impaired. Memory is impaired.     Comments: Attention is fair.   Worsening mood over health. He is not oriented to this specific date.  He is oriented to the situation. Insight and judgment are fair and limited because of cognitive impairment Tremor Walker     Lab Review:     Component Value Date/Time   NA 140 01/14/2020 1112   NA 143 07/28/2019 1529   K 4.0 01/14/2020 1112   CL 105 01/14/2020 1112   CO2 27 01/14/2020 1112   GLUCOSE 87 01/14/2020 1112   BUN 16 01/14/2020 1112   BUN 18 07/28/2019 1529   CREATININE 0.79 01/14/2020 1112   CREATININE 0.82 08/06/2017 1554   CALCIUM 9.6 01/14/2020 1112   PROT 6.5 01/14/2020 1112   ALBUMIN 3.8 01/14/2020 1112   AST 17 01/14/2020 1112   ALT 12 01/14/2020 1112   ALKPHOS 57 01/14/2020 1112   BILITOT 0.7 01/14/2020 1112   GFRNONAA >60 01/14/2020 1112   GFRAA 95 07/28/2019 1529       Component Value Date/Time   WBC 3.5 (L) 01/14/2020 1112   RBC 4.35 01/14/2020 1112   HGB 13.5 01/14/2020 1112   HCT 40.9 01/14/2020 1112   PLT 160 01/14/2020 1112   MCV 94.0 01/14/2020 1112   MCH 31.0 01/14/2020 1112   MCHC 33.0 01/14/2020 1112   RDW 13.2 01/14/2020 1112   LYMPHSABS 0.9 01/14/2020 1112   MONOABS 0.4 01/14/2020 1112   EOSABS 0.0 01/14/2020 1112    BASOSABS 0.0 01/14/2020 1112    No results found for: POCLITH, LITHIUM   No results found for: PHENYTOIN, PHENOBARB, VALPROATE, CBMZ   .res Assessment: Plan:    Avrohom was seen today for major depressive disorder, recurrent episode, moderate (hcc) and follow-up.  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  Hx psychosis with PD resolved and no worse with DC of quetiapine  Continue mirtazapine 30 mg HS  Change paroxetine 30 mg to night to help with anxious depression and appetite.  Pending neuro eval at [facility] \ Follow-up in 4-6 months  Meredith Staggers, MD,  DFAPA   Future Appointments  Date Time Provider Department Center  04/30/2020  2:45 PM Duke Salvia, MD [facility]    No orders of the defined types were placed in this encounter.   -------------------------------

## 2020-04-13 ENCOUNTER — Telehealth: Payer: Medicare Other | Admitting: Internal Medicine

## 2020-04-30 ENCOUNTER — Ambulatory Visit: Payer: Medicare Other | Admitting: Internal Medicine

## 2020-05-11 ENCOUNTER — Encounter: Payer: Self-pay | Admitting: Internal Medicine

## 2020-05-11 ENCOUNTER — Other Ambulatory Visit: Payer: Self-pay

## 2020-05-11 ENCOUNTER — Ambulatory Visit (INDEPENDENT_AMBULATORY_CARE_PROVIDER_SITE_OTHER): Payer: Medicare Other | Admitting: Internal Medicine

## 2020-05-11 VITALS — Ht 73.0 in | Wt 148.0 lb

## 2020-05-11 DIAGNOSIS — I951 Orthostatic hypotension: Secondary | ICD-10-CM

## 2020-05-11 DIAGNOSIS — I4892 Unspecified atrial flutter: Secondary | ICD-10-CM
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
## 2020-05-11 NOTE — Patient Instructions (Addendum)
Medication Instructions:  Your physician recommends that you continue on your current medications as directed. Please refer to the Current Medication list given to you today.  Labwork: None ordered.  Testing/Procedures: None ordered.  Follow-Up: Your physician wants you to follow-up in: 6 months with Dr. Graciela Husbands or  Gypsy Balsam, NP  Francis Dowse, PA-C  [facility] [place], [place]   You will receive a reminder letter in the mail two months in advance. If you don't receive a letter, please call our office to schedule the follow-up appointment.   Any Other Special Instructions Will Be Listed Below (If Applicable).  If you need a refill on your cardiac medications before your next appointment, please call your pharmacy.

## 2020-09-05 ENCOUNTER — Encounter: Payer: Self-pay | Admitting: Psychiatry

## 2020-09-05 ENCOUNTER — Other Ambulatory Visit: Payer: Self-pay

## 2020-09-05 ENCOUNTER — Ambulatory Visit (INDEPENDENT_AMBULATORY_CARE_PROVIDER_SITE_OTHER): Payer: Medicare Other | Admitting: Psychiatry

## 2020-09-05 DIAGNOSIS — F0280 Dementia in other diseases classified elsewhere without behavioral disturbance: Secondary | ICD-10-CM | POA: Diagnosis not present

## 2020-09-05 DIAGNOSIS — F331 Major depressive disorder, recurrent, moderate: Secondary | ICD-10-CM | POA: Diagnosis not present

## 2020-09-05 DIAGNOSIS — G20 Parkinson's disease: Secondary | ICD-10-CM | POA: Diagnosis not present
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
## 2020-11-20 ENCOUNTER — Other Ambulatory Visit: Payer: Self-pay

## 2020-11-20 ENCOUNTER — Encounter: Payer: Self-pay | Admitting: Internal Medicine

## 2020-11-20 ENCOUNTER — Ambulatory Visit (INDEPENDENT_AMBULATORY_CARE_PROVIDER_SITE_OTHER): Payer: Medicare Other | Admitting: Internal Medicine

## 2020-11-20 VITALS — BP 122/72 | HR 129 | Ht 72.0 in | Wt 154.0 lb

## 2020-11-20 DIAGNOSIS — Z79899 Other long term (current) drug therapy: Secondary | ICD-10-CM

## 2020-11-20 DIAGNOSIS — I4892 Unspecified atrial flutter: Secondary | ICD-10-CM

## 2020-11-20 DIAGNOSIS — I951 Orthostatic hypotension: Secondary | ICD-10-CM | POA: Diagnosis not present
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
## 2020-11-20 NOTE — Patient Instructions (Signed)
Medication Instructions:  Your physician recommends that you continue on your current medications as directed. Please refer to the Current Medication list given to you today.  *If you need a refill on your cardiac medications before your next appointment, please call your pharmacy*   Lab Work: CBC and BMET today If you have labs (blood work) drawn today and your tests are completely normal, you will receive your results only by: . MyChart Message (if you have MyChart) OR . A paper copy in the mail If you have any lab test that is abnormal or we need to change your treatment, we will call you to review the results.   Testing/Procedures: None ordered.    Follow-Up: At [facility], you and your health needs are our priority.  As part of our continuing mission to provide you with exceptional heart care, we have created designated Provider Care Teams.  These Care Teams include your primary Cardiologist (physician) and Advanced Practice Providers (APPs -  Physician Assistants and Nurse Practitioners) who all work together to provide you with the care you need, when you need it.  We recommend signing up for the patient portal called "MyChart".  Sign up information is provided on this After Visit Summary.  MyChart is used to connect with patients for Virtual Visits (Telemedicine).  Patients are able to view lab/test results, encounter notes, upcoming appointments, etc.  Non-urgent messages can be sent to your provider as well.   To learn more about what you can do with MyChart, go to [email or web address].    Your next appointment:   12 month(s)  The format for your next appointment:   In Person  Provider:   Steven Klein, MD     

## 2020-11-20 NOTE — Progress Notes (Signed)
Error

## 2020-11-21 LAB — BASIC METABOLIC PANEL
BUN/Creatinine Ratio: 20 (ref 10–24)
BUN: 16 mg/dL (ref 8–27)
CO2: 28 mmol/L (ref 20–29)
Calcium: 9.4 mg/dL (ref 8.6–10.2)
Chloride: 103 mmol/L (ref 96–106)
Creatinine, Ser: 0.81 mg/dL (ref 0.76–1.27)
Glucose: 90 mg/dL (ref 65–99)
Potassium: 3.5 mmol/L (ref 3.5–5.2)
Sodium: 144 mmol/L (ref 134–144)
eGFR: 87 mL/min/{1.73_m2} (ref >59)

## 2020-11-21 LAB — CBC
Hematocrit: 37.2 % — ABNORMAL LOW (ref 37.5–51.0)
Hemoglobin: 12.7 g/dL — ABNORMAL LOW (ref 13.0–17.7)
MCH: 30.7 pg (ref 26.6–33.0)
MCHC: 34.1 g/dL (ref 31.5–35.7)
MCV: 90 fL (ref 79–97)
Platelets: 164 10*3/uL (ref 150–450)
RBC: 4.14 x10E6/uL (ref 4.14–5.80)
RDW: 13.2 % (ref 11.6–15.4)
WBC: 4.2 10*3/uL (ref 3.4–10.8)

## 2021-01-07 IMAGING — CT CT ABD-PELV W/ CM
2 of 5 series · 16 of 46 positions shown, 18 images · IV contrast (omnipaque)
Comparison: CT of the abdomen and pelvis 04/07/2018.

CLINICAL DATA: Abdominal pain. Status post recent surgery for
sigmoid volvulus.

EXAM:
CT ABDOMEN AND PELVIS WITH CONTRAST
TECHNIQUE: Multidetector CT imaging of the abdomen and pelvis was performed
using the standard protocol following bolus administration of
intravenous contrast.
CONTRAST:  100mL OMNIPAQUE IOHEXOL 300 MG/ML  SOLN

[Series 3: abdomen 5.0 · axial · 0.80mm/px · z∈[+970,+1355]mm · 13 of 89 slices shown, 15 images]
[im 6/89  soft-tissue]
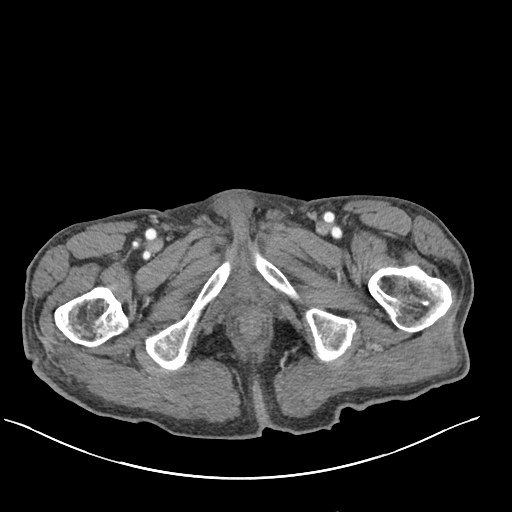
[im 6/89  bone]
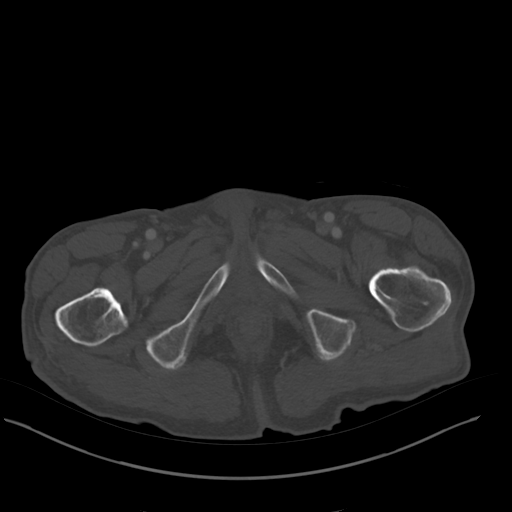
[im 12/89  soft-tissue]
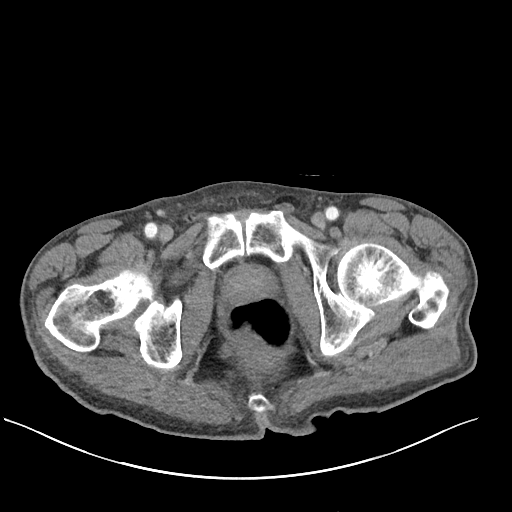
[im 17/89  soft-tissue]
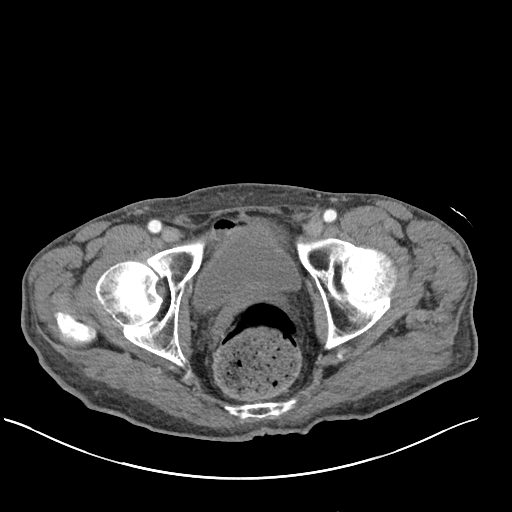
[im 28/89  soft-tissue]
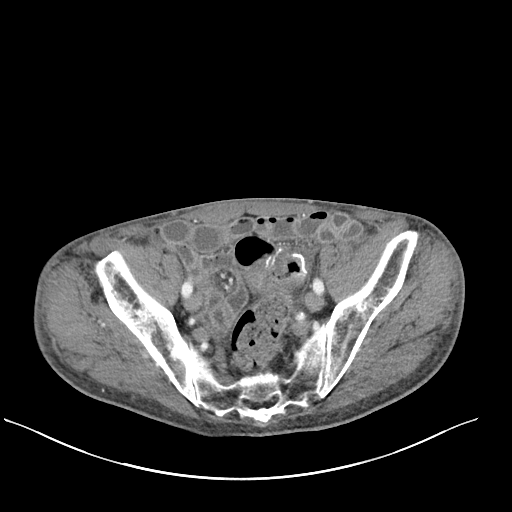
[im 34/89  soft-tissue]
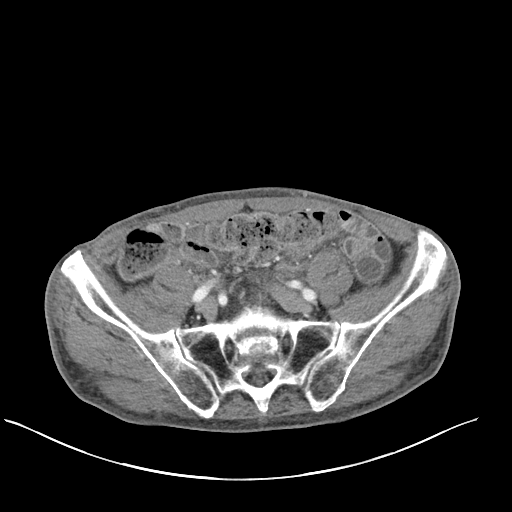
[im 39/89  soft-tissue]
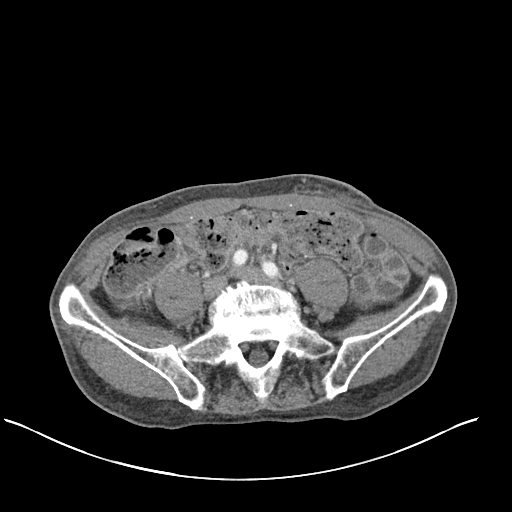
[im 45/89  soft-tissue]
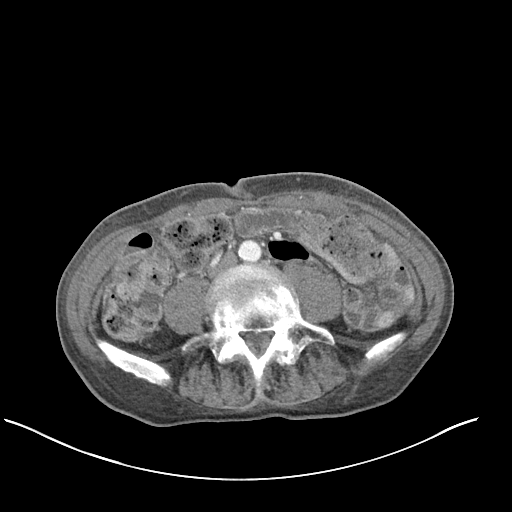
[im 50/89  soft-tissue]
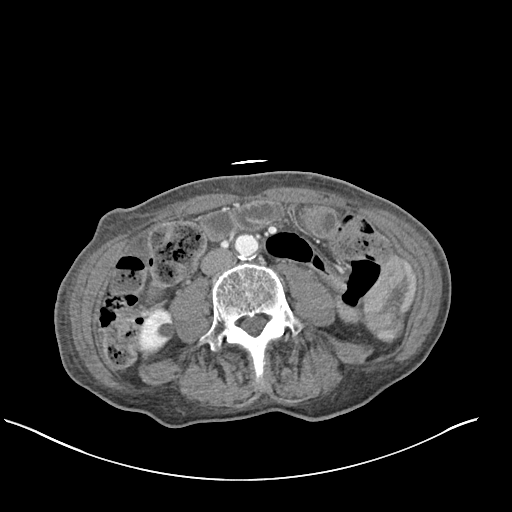
[im 56/89  soft-tissue]
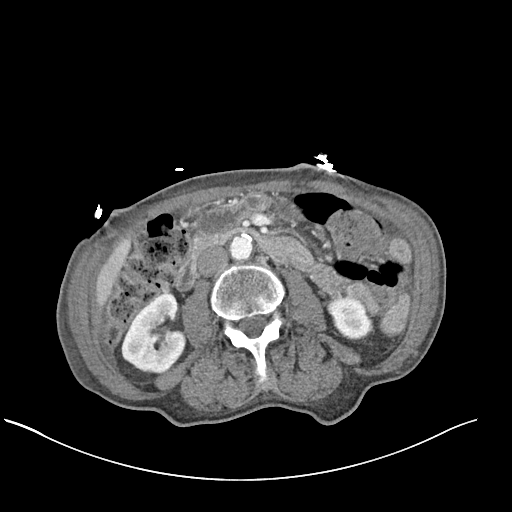
[im 56/89  bone]
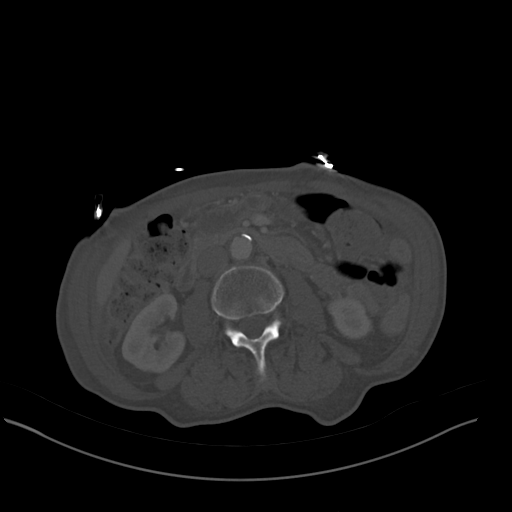
[im 61/89  soft-tissue]
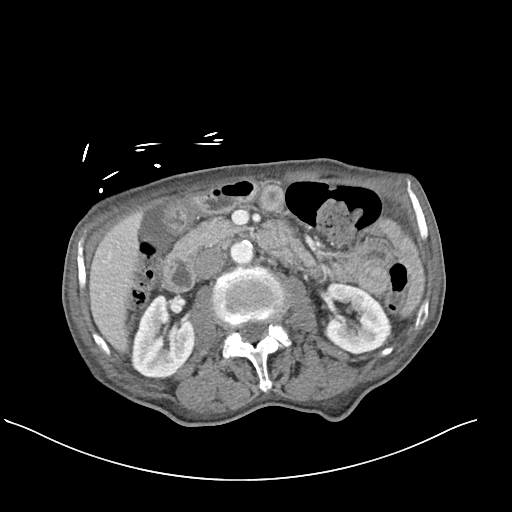
[im 72/89  soft-tissue]
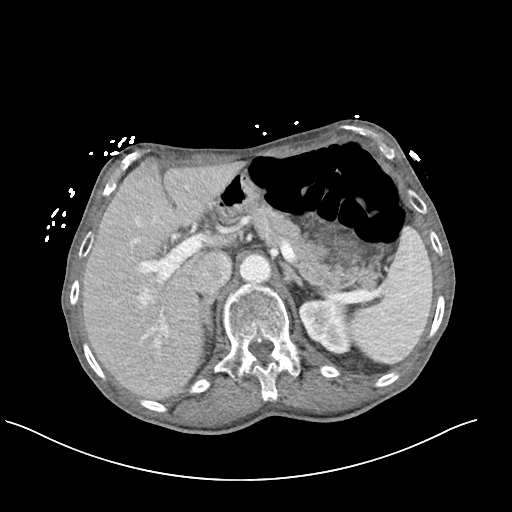
[im 78/89  soft-tissue]
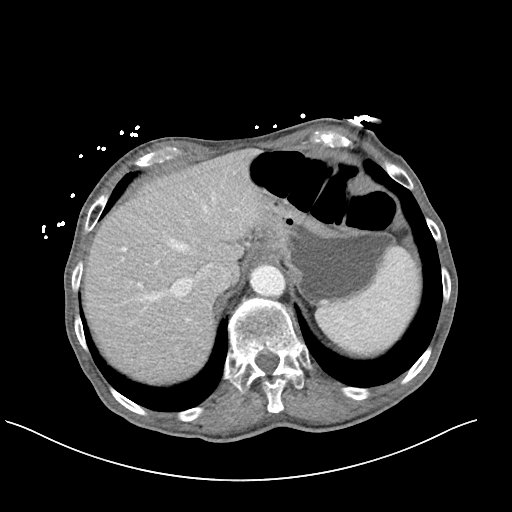
[im 83/89  soft-tissue]
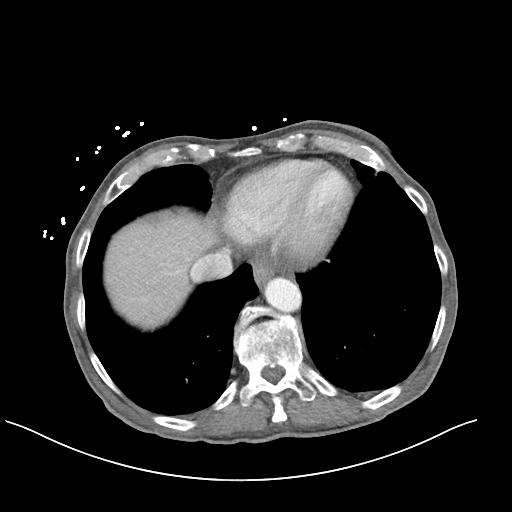

[Series 6: abdomen 3.0 mpr cor · coronal · 0.80mm/px · 3 of 100 slices shown]
[im 34/100  soft-tissue]
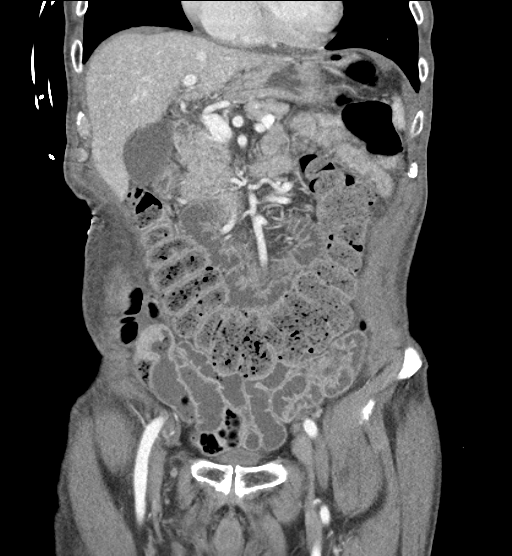
[im 45/100  soft-tissue]
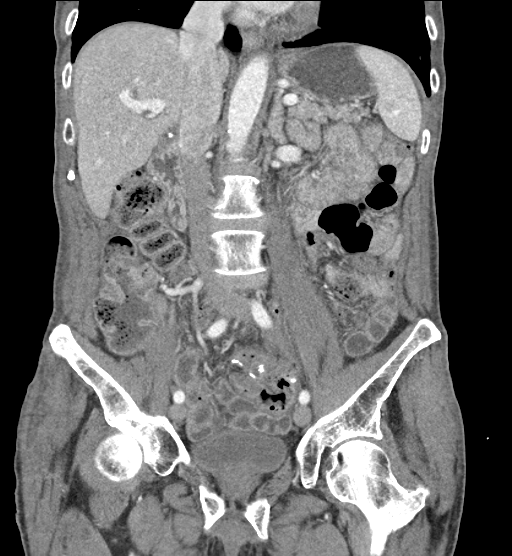
[im 56/100  soft-tissue]
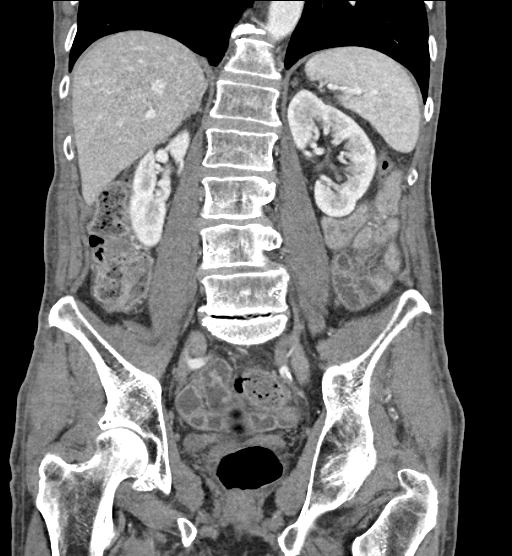

[16 of 46 positions shown; findings below may reference images not displayed]

FINDINGS: Lower chest: Lung bases are clear without focal nodule, mass, or
airspace disease. Heart size is normal. There is no significant
pleural or pericardial effusion.

Hepatobiliary: Hepatic cyst in the left lobe is stable. No other
focal lesions are present. A single stone at the neck of the
gallbladder is stable. No inflammatory changes are present. The
common bile duct is within normal limits..

Pancreas: Unremarkable. No pancreatic ductal dilatation or
surrounding inflammatory changes.

Spleen: Normal in size without focal abnormality.

Adrenals/Urinary Tract: Adrenal glands are normal bilaterally.
Simple cyst is present at the lower pole of the right kidney,
unchanged. A punctate stone at the upper pole of the left kidney is
stable, nonobstructing. Kidneys are otherwise unremarkable. Ureters
are within normal limits. The urinary bladder is within normal
limits.

Stomach/Bowel: Stomach and duodenum are within normal limits. The
small bowel is unremarkable. Terminal ileum is within normal limits.
The ascending and transverse colon are now within normal limits. The
descending colon is unremarkable. Anastomosis in the sigmoid colon
is intact. Moderate stool is present at the rectum, distal to the
anastomosis. There is no obstruction.

Vascular/Lymphatic: Atherosclerotic calcifications are present at
the aorta and branch vessels. No significant adenopathy is present.

Reproductive: Prostate is unremarkable.

Other: No abdominal wall hernia or abnormality. No abdominopelvic
ascites.

Musculoskeletal: Mild rightward curvature is present in the lumbar
spine. Grade 1 anterolisthesis at L4-5 is stable. A vacuum disc is
present at L5-S1. Pelvis intact. The hips are located and within
normal limits.
IMPRESSION: 1. Postsurgical changes of the sigmoid colon without complicating
features.
2. Moderate stool at the rectum without obstruction.
3.  Aortic Atherosclerosis ([id number]).
4. Punctate nonobstructing stone at the upper pole of the left
kidney.
5. Hepatic and renal cysts.
6. Cholelithiasis without evidence for cholecystitis.

## 2021-03-07 ENCOUNTER — Encounter: Payer: Self-pay | Admitting: Psychiatry

## 2021-03-07 ENCOUNTER — Ambulatory Visit (INDEPENDENT_AMBULATORY_CARE_PROVIDER_SITE_OTHER): Payer: Medicare Other | Admitting: Psychiatry

## 2021-03-07 DIAGNOSIS — G20 Parkinson's disease: Secondary | ICD-10-CM

## 2021-03-07 DIAGNOSIS — F0280 Dementia in other diseases classified elsewhere without behavioral disturbance: Secondary | ICD-10-CM

## 2021-03-07 DIAGNOSIS — F331 Major depressive disorder, recurrent, moderate: Secondary | ICD-10-CM

## 2021-03-07 NOTE — Progress Notes (Signed)
Stephen Gentry [id number] 07-24-1934 86 y.o.  Subjective:   Patient ID:  Stephen Gentry is a 86 y.o. (DOB [birth year]) male.  Chief Complaint:  Chief Complaint  Patient presents with   Follow-up   Depression    Depression        Associated symptoms include decreased concentration, fatigue and appetite change. 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  Seen with wife Stephen Gentry.  His first visit was September 06, 2018.  For the symptoms of anxiety, dread and obsessing over dying associated with some psychotic symptoms we altered dosing of quetiapine to half of a 25 mg tablet twice daily for 4 to 5 days and if not effective increase to 1 tablet twice daily if needed.  He was to continue on the Lexapro 20 mg daily but if it failed to adequately manage his anxiety there was consideration of switching to paroxetine although there are greater drug interaction issues with that medicine.  visit  November 08, 2018.  Lexapro 20 mg was continued as was quetiapine between 25 and 50 mg daily.  Mirtazapine 15 mg was added. Per wife mirtazapine 15 mg with a much improved appetite and weight.  "Wonder drug". Less bloating with Colace and Senna.  at visit October 2020.  No meds were changed.  March 2021 visit the following is noted: Just had Holter monitor.  Pending eval for afib. Overall health worse with a lot of bad days with health inluding heart and PD.  Weakness and exhaustion easily.  SOB.  Some depression over things he can't do. Sleep good. Not real good bc PD makes it difficult to do things.  Wonders about tthe future.  Depressed bc nature  Of the disease.  Abd pain off and on but seems more settled.  Sleep pretty good. Staying up until 9 and then wonders if he took pills and get OOB.  Sleeps about 12 hours.  They don't sleep together bc of dogs. No much anxiety and no longer pacing.  Not asking to  go to the ER like before meds.      Plan:Trial increase mirtazapine to 30 mg HS .  It helped really quickly with appetite but he is progressively somewhat worse.    09/07/2019 appointment the following is noted: He still feels bad every day.  Fatigue, brain "fog".  Will say he needs to be in the hospital.  Tremor and bloating.  Appetite not better. Wt 163#  Reduced quetiapine to 25 mg HS per neuro bc of low blood pressure.  Not sure if he's more dysphoric but might be.  Blood pressure maybe better.   Not pacing. He can't follow TV show with plot. Incrase mirtazapine to 45 mg HS  TC from wife: Patient's wife called and said that Stephen Gentry has been on 45 mg of the mirtazapine since his last visit with Stephen Gentry which was 6/30. Since that time he seems worse. He has increased agitation, PAIN IN THE ABDOMEN, BRAIN FOG AND LACK OF APPETITTE. All of  His other meds are the same so she doesn't know if this is from the increase of the mirtazapine or if it is the parkinsons. Please give her a call back at [phone]  Response: Spoke with Stephen Gentry, she didn't pick up the 45 mg mirtazapine yet just been giving a 30 mg and a 15 mg. Advised her she could go back down to  30 mg as was the increase a few weeks ago or just give him the 15 mg. She said she will see how he does. She appreciates the recommendation. Said it's so hard to tell because he just has so much medical that bothers him too.   11/10/19 appt with the following noted: Seen with wife Wouldn't say real great.  Cycles of feeling bad likely with PD with flu-like sx likely related to PD or meds for it and wants to go to the hospital.  Doesn't remember being told that won't help and argues about it. No hallucinations Plan: Reduce mirtazapine to 22.5 mg HS to help appetite and reduce agitation Switch lexapro to paroxetine 30 mg to help with anxious depression and appetite. Option stop quetiapine  01/11/2020 appointment with the following noted:  Seen with  wife who provides history.. Moved to asst living [name] 12-31-19.  PCP had Covid.  PA of the PCP filled out FL2 incorrectly.   Wife reviewed the med list and found mistakes.  Example taking the 30 mg mirtazapine.  Also taking the quetiapine 25 mg AM.  Paroxetine 30 AM. Holding his own on his weight. Yesterday 156#. Tomorrow appt with neurologist at [name]. A little less anxious and perhaps better appetite with switch from Lexapro to paroxetine.   Apparently less ruminating on the need to go to the hospital.  Plan: stop quetiapine as currently taking 25 mg daily  04/12/2020 appt noted: Seen with wife Moved to [name] 03/20/20  after wandering out.  No differences good or bad with stopping quetiapine.   Constant belly pain with constipation for years. DX 2015 PD. Appetite mediocre. Wife things some weight loss.  But she sees him eat fairly well. Sleep pretty well usually. From 9-4 AM.     Depressed over difficulty doing things he'd like.  Not nervous. Plan: Hx psychosis with PD resolved and no worse with DC of quetiapine Continue mirtazapine 30 mg HS Change paroxetine 30 mg to night to help with anxious depression and appetite.  09/05/2020 appointment with the following noted: Staying at [name].  Feels well treated.   "Not real good" DT weakness. W says overall doing better than when living at home and uncomfortable all the time. Doing well with meds.   Eating OK with not a big appetite.  Holding his own with weight.  Sleep is OK. No SE with meds.   No fear.   Some anxiety.  I suspect I'm depressed some.  Hard to enjoy much bc can't do much.  Cannot read anymore or work on motorcycles.  Watches TV.   Plan: Hx psychosis with PD resolved and no worse with DC of quetiapine Continue mirtazapine 30 mg HS Change paroxetine 30 mg to night to help with anxious depression and appetite. No indication for med changes.  03/07/2021 appointment with the following  noted: with wife [name] depressed with himself bc difficulty doing things.  W notes he has more episodes of freezing. Appetite is pretty good and wife agrees.  She visits him 3 times weekly and he has gained some weitght with mirtazapine.   No fear. Sleep pretty well. Resident Barrett Henle, memory care. Family outing last night Chronic constipation managed.   No SE known.  Past Psychiatric Medication Trials: On Lexapro for nearly 3 years.   Started quetiapine 25 mg May 2020,  sertraline 50 less effective,  Abilify for 2 weeks without response. Mirtazapine 30 helped appetite initally  Review of  Systems:  Review of Systems  Constitutional:  Positive for appetite change and fatigue. Negative for unexpected weight change.  Cardiovascular:  Negative for palpitations.  Gastrointestinal:  Positive for abdominal pain and constipation.  Neurological:  Positive for tremors and weakness.       Shuffling gait and balance problems without change Neuropathy feet and hands occ falls  Psychiatric/Behavioral:  Positive for decreased concentration. Negative for hallucinations.    Medications: I have reviewed the patient's current medications.  Current Outpatient Medications  Medication Sig Dispense Refill   apixaban (ELIQUIS) 5 MG TABS tablet Take 1 tablet (5 mg total) by mouth 2 (two) times daily. 60 tablet 6   B Complex-C (SUPER B COMPLEX PO) Take 1 tablet by mouth daily.     Carbidopa-Levodopa ER (SINEMET CR) 25-100 MG tablet controlled release Take 7 tablets by mouth as directed. 2 tablets at 6AM, 2 tablets at 11AM, 2 tablets at 4PM, 1 tablet at bedtime     Cholecalciferol 50 MCG (2000 UT) CAPS Take 2,000 Units by mouth daily.      cyanocobalamin (,VITAMIN B-12,) 1000 MCG/ML injection Inject 1,000 mcg into the muscle every 30 (thirty) days.     fludrocortisone (FLORINEF) 0.1 MG tablet Take 2 tablets (0.2 mg total) by mouth 2 (two) times daily. 360 tablet 3   midodrine (PROAMATINE)  10 MG tablet Take 1 tablet (10 mg) at 7 AM, 11 AM, and 3 PM 270 tablet 3   mirtazapine (REMERON) 30 MG tablet Take 1 tablet (30 mg total) by mouth at bedtime. 30 tablet 3   PARoxetine (PAXIL) 30 MG tablet Take 1 tablet (30 mg total) by mouth daily. 90 tablet 0   polyethylene glycol (MIRALAX / GLYCOLAX) 17 g packet Take 34 g by mouth 2 (two) times daily. 14 each 0   potassium chloride (KLOR-CON) 10 MEQ tablet Take 3 tablets (30 mEq total) by mouth daily. 270 tablet 3   rivastigmine (EXELON) 4.6 mg/24hr Place 4.6 mg onto the skin daily.     senna (SENOKOT) 8.6 MG TABS tablet Take 2 tablets by mouth daily.     No current facility-administered medications for this visit.    Medication Side Effects: None  Allergies:  Allergies  Allergen Reactions   Lorazepam Other (See Comments)    Combative, foul language. Wife doesn't want patient to have again   Tetracyclines & Related Anaphylaxis   Donepezil Other (See Comments)    Didn't like   Amitiza [Lubiprostone] Nausea Only    Past Medical History:  Diagnosis Date   Alzheimer's dementia (HCC)    Anemia    Anticoagulant long-term use    xarelto   Anxiety    Arthritis    Autonomic dysfunction    Baker's cyst    right knee   Chronic constipation    Diverticulosis    Internal hemorrhoids    Nocturia    Numbness and tingling in right hand    since tablesaw injury 2002   Numbness and tingling of both feet    toes   Orthostatic hypotension    PAF (paroxysmal atrial fibrillation) [facility])    cardiologist-- dr Graciela Husbands   Parkinson's disease [facility])    neurologist-- dr tat  (idiopathic,  notes in epic)   Tremor, essential    Wears glasses     Family History  Problem Relation Age of Onset   Lung cancer Mother    Lung cancer Father    Colon cancer Neg Hx    Esophageal cancer  Neg Hx    Pancreatic cancer Neg Hx    Stomach cancer Neg Hx    Liver disease Neg Hx    Rectal cancer Neg Hx     Social History   Socioeconomic History    Marital status: Married    Spouse name: Stephen Gentry   Number of children: 3   Years of education: Not on file   Highest education level: Not on file  Occupational History   Occupation: retired    Comment: college professor geology   Occupation: retired    Comment: Medical sales representative  Tobacco Use   Smoking status: Former    Years: 3.00    Types: Cigarettes    Quit date: 10/23/1956    Years since quitting: 64.4   Smokeless tobacco: Never  Vaping Use   Vaping Use: Never used  Substance and Sexual Activity   Alcohol use: Not Currently    Alcohol/week: 0.0 standard drinks   Drug use: Never   Sexual activity: Not on file  Other Topics Concern   Not on file  Social History Narrative   Not on file   Social Determinants of Health   Financial Resource Strain: Not on file  Food Insecurity: Not on file  Transportation Needs: Not on file  Physical Activity: Not on file  Stress: Not on file  Social Connections: Not on file  Intimate Partner Violence: Not on file    Past Medical History, Surgical history, Social history, and Family history were reviewed and updated as appropriate.   Please see review of systems for further details on the patient's review from today.   Objective:   Physical Exam:  There were no vitals taken for this visit.  Physical Exam Neurological:     Mental Status: He is alert and oriented to person, place, and time.     Cranial Nerves: No dysarthria.  Psychiatric:        Attention and Perception: Attention and perception normal. He does not perceive auditory or visual hallucinations.        Mood and Affect: Mood normal. Mood is not anxious or depressed.        Speech: Speech normal. Speech is not slurred.        Behavior: Behavior is slowed. Behavior is not agitated. Behavior is cooperative.        Thought Content: Thought content normal. Thought content is not paranoid or delusional. Thought content does not include homicidal or suicidal ideation. Thought  content does not include suicidal plan.        Cognition and Memory: Memory is impaired. He exhibits impaired recent memory and impaired remote memory.        Judgment: Judgment normal.     Comments: Insight intact    Lab Review:     Component Value Date/Time   NA 144 11/20/2020 1528   K 3.5 11/20/2020 1528   CL 103 11/20/2020 1528   CO2 28 11/20/2020 1528   GLUCOSE 90 11/20/2020 1528   GLUCOSE 87 01/14/2020 1112   BUN 16 11/20/2020 1528   CREATININE 0.81 11/20/2020 1528   CREATININE 0.82 08/06/2017 1554   CALCIUM 9.4 11/20/2020 1528   PROT 6.5 01/14/2020 1112   ALBUMIN 3.8 01/14/2020 1112   AST 17 01/14/2020 1112   ALT 12 01/14/2020 1112   ALKPHOS 57 01/14/2020 1112   BILITOT 0.7 01/14/2020 1112   GFRNONAA >60 01/14/2020 1112   GFRAA 95 07/28/2019 1529       Component Value Date/Time   WBC  4.2 11/20/2020 1528   WBC 3.5 (L) 01/14/2020 1112   RBC 4.14 11/20/2020 1528   RBC 4.35 01/14/2020 1112   HGB 12.7 (L) 11/20/2020 1528   HCT 37.2 (L) 11/20/2020 1528   PLT 164 11/20/2020 1528   MCV 90 11/20/2020 1528   MCH 30.7 11/20/2020 1528   MCH 31.0 01/14/2020 1112   MCHC 34.1 11/20/2020 1528   MCHC 33.0 01/14/2020 1112   RDW 13.2 11/20/2020 1528   LYMPHSABS 0.9 01/14/2020 1112   MONOABS 0.4 01/14/2020 1112   EOSABS 0.0 01/14/2020 1112   BASOSABS 0.0 01/14/2020 1112    No results found for: POCLITH, LITHIUM   No results found for: PHENYTOIN, PHENOBARB, VALPROATE, CBMZ   .res Assessment: Plan:    Tanav was seen today for follow-up and depression.  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  But he is struggling with his illness.    Hx psychosis with PD resolved  and no worse with DC of quetiapine  Continue mirtazapine 30 mg HS  Change paroxetine 30 mg to night to help with anxious depression and appetite.  No indication for med changes. He and wife agree with the plan  Neuro follows PD  Supportive therapy dealing with chronic disabling PD and complications. \ Follow-up in 6 months  Meredith Staggers, MD, DFAPA   No future appointments.   No orders of the defined types were placed in this encounter.    -------------------------------

## 2021-05-29 IMAGING — US ULTRASOUND ABDOMEN COMPLETE
2 series · 13 of 25 positions shown · non-contrast
Comparison: CT abdomen and pelvis 09/20/2018

CLINICAL DATA: Chronic constipation, weight loss and abdominal
pain, early satiety, history Parkinson's, atrial fibrillation,
former smoker

EXAM:
ABDOMEN ULTRASOUND COMPLETE

[Series 1: ultrasound abdomen complete · 12 of 69 slices shown (1 of 2)]
[im 1/69]
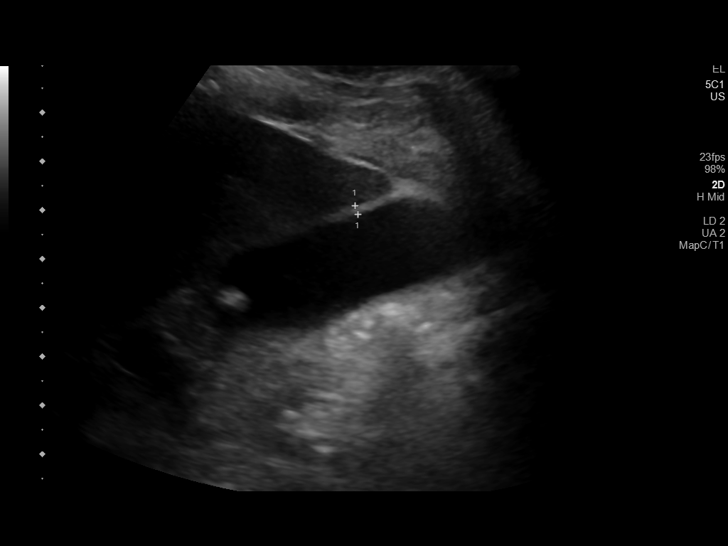
[im 6/69]
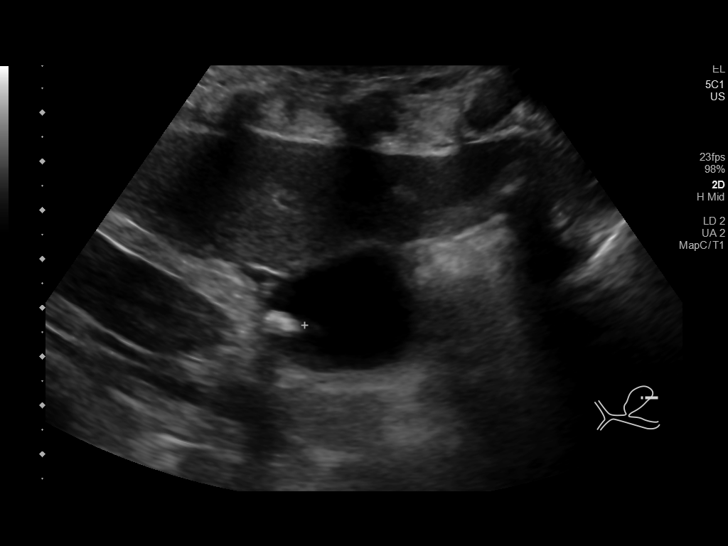
[im 12/69]
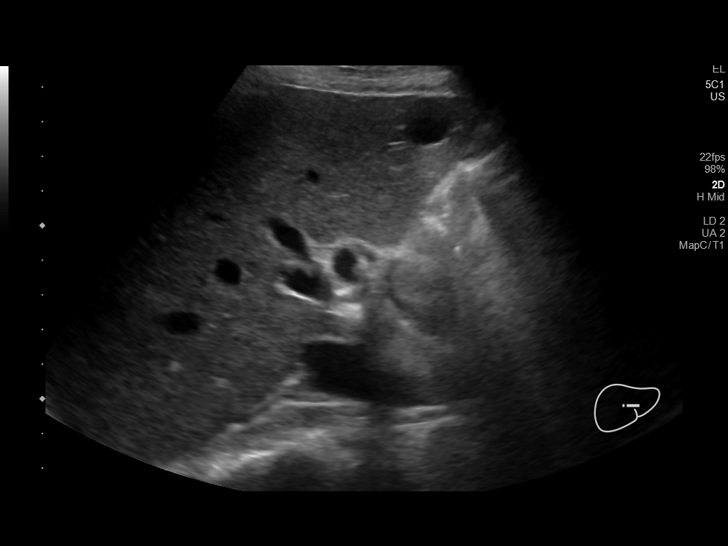
[im 18/69]
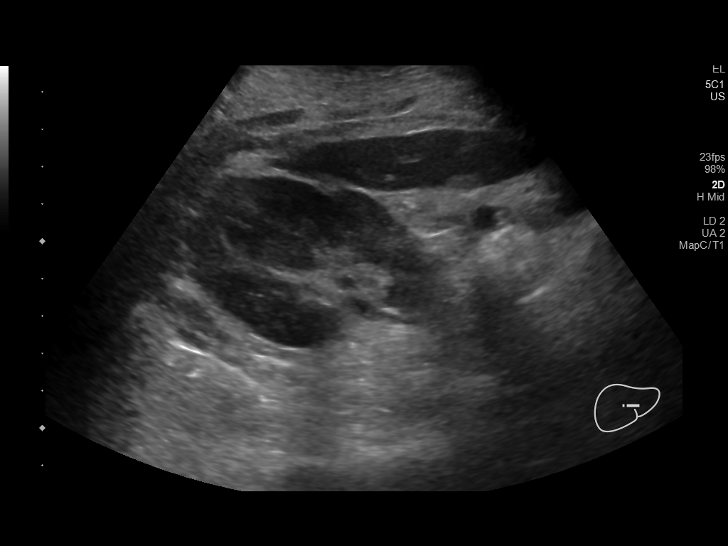
[im 24/69]
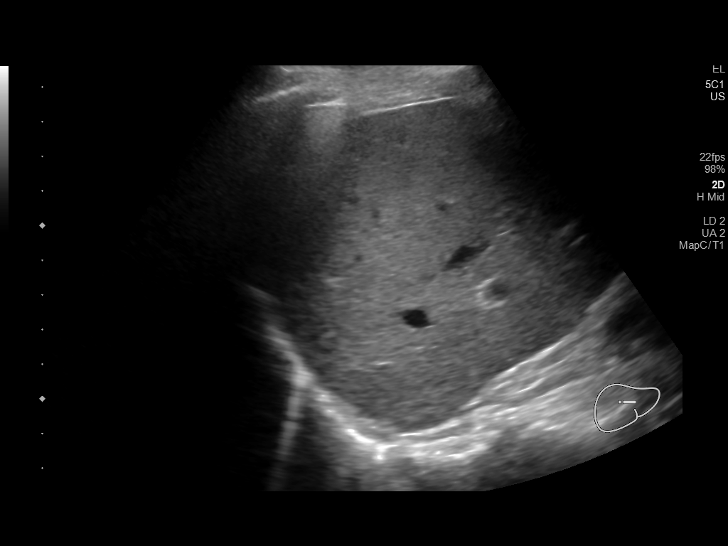
[im 30/69]
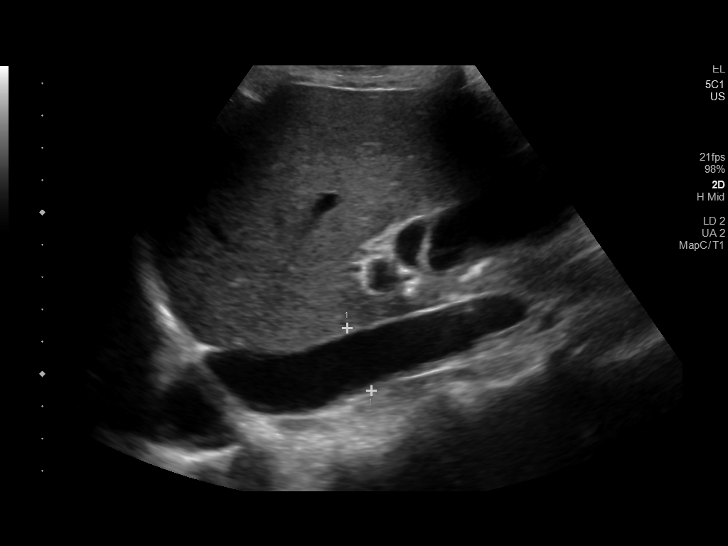
[im 36/69]
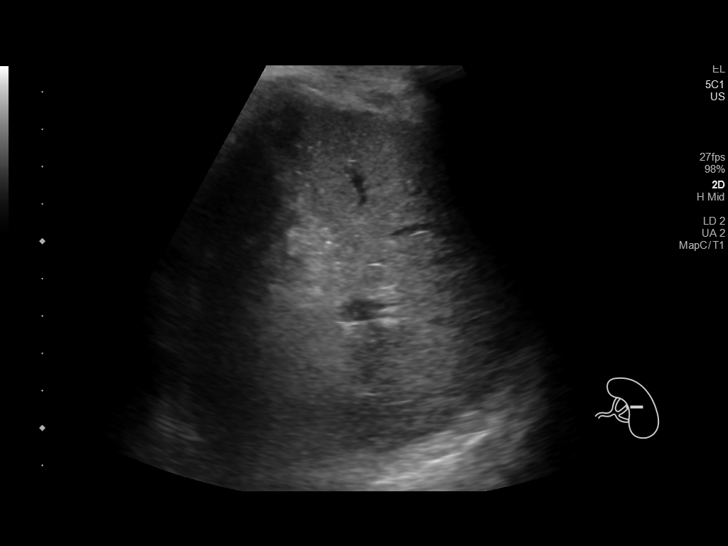
[im 42/69]
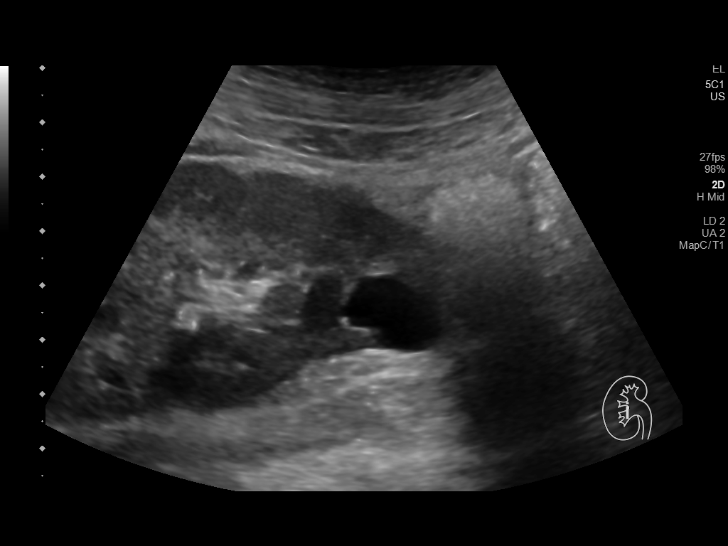
[im 48/69]
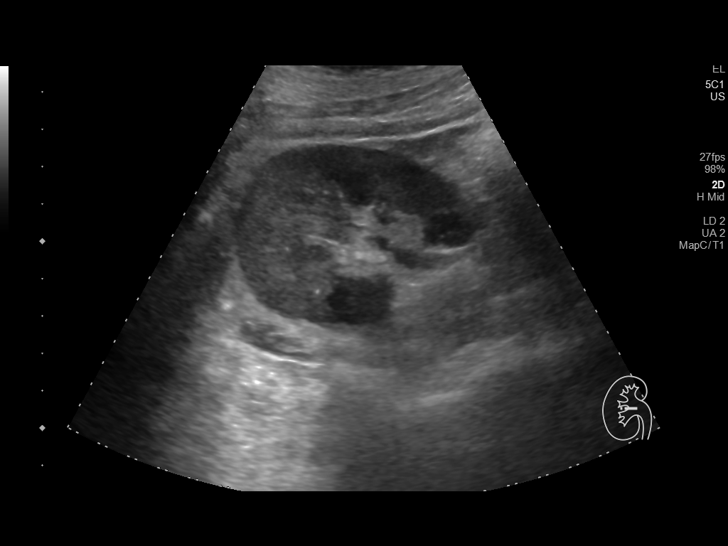
[im 54/69]
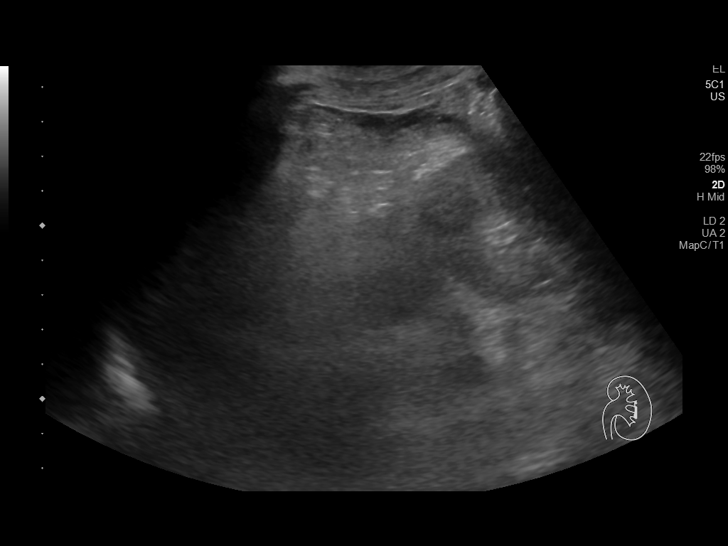
[im 60/69]
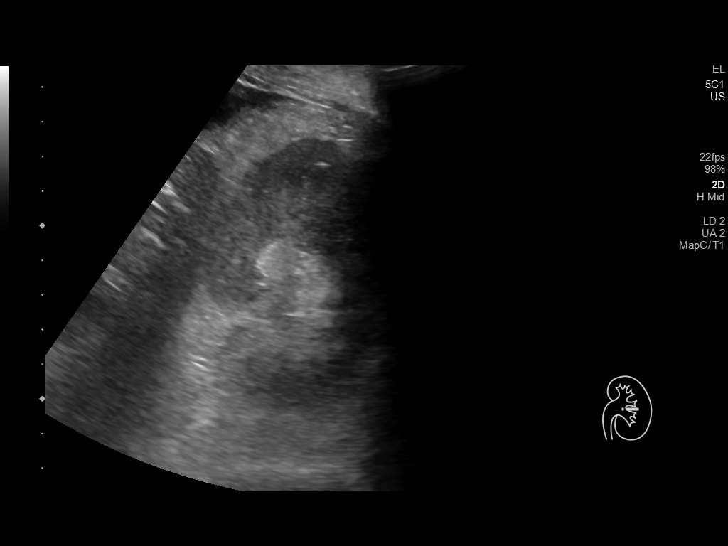
[im 66/69]
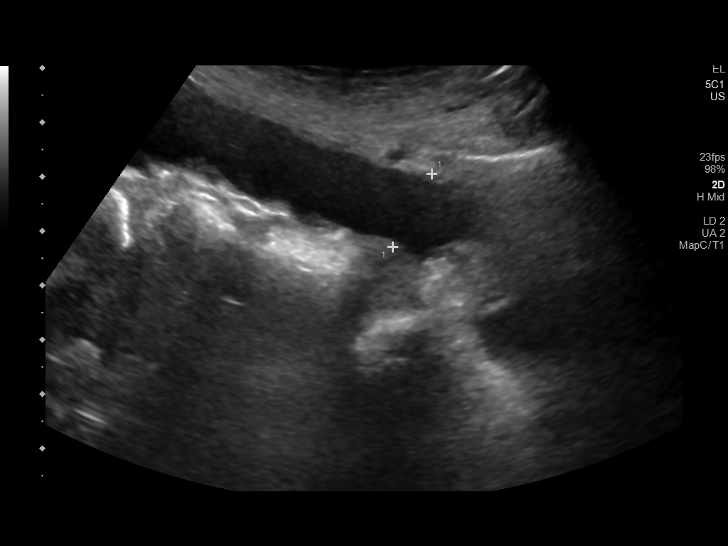

[Series 2: ultrasound abdomen complete · 1 of 1 slices shown (2 of 2)]
[im 1/1]
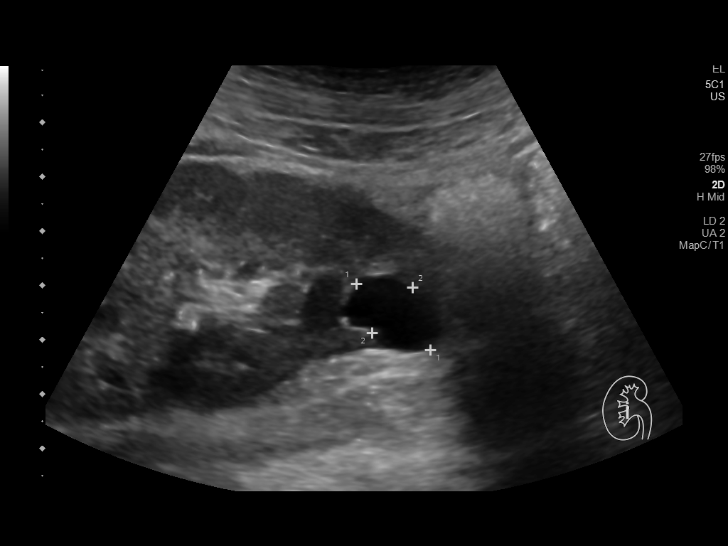

[13 of 25 positions shown; findings below may reference images not displayed]

FINDINGS: Gallbladder: Shadowing mobile calculus within gallbladder 8 mm
diameter. No gallbladder wall thickening, pericholecystic fluid or
sonographic Murphy sign.

Common bile duct: Diameter: 2 mm diameter, normal

Liver: Normal parenchymal echogenicity. Small cyst LEFT lobe 16 x 12
x 13 mm, simple features. Remainder of liver unremarkable. Portal
vein is patent on color Doppler imaging with normal direction of
blood flow towards the liver.

IVC: Normal appearance

Pancreas: Obscured by bowel gas

Spleen: Normal appearance, 5.6 cm length

Right Kidney: Length: 11.8 cm. Normal cortical thickness and
echogenicity. Cyst at inferior pole 18 x 11 x 13 mm. No
hydronephrosis.

Left Kidney: Length: 10.0 cm. Normal cortical thickness and
echogenicity. No mass, hydronephrosis or shadowing calcification.

Abdominal aorta: Normal caliber

Other findings: No free fluid
IMPRESSION: Cholelithiasis.

Small hepatic and RIGHT renal cysts.

Nonvisualization of pancreas due to bowel gas.

## 2021-06-06 ENCOUNTER — Emergency Department (HOSPITAL_COMMUNITY): Payer: Medicare Other

## 2021-06-06 ENCOUNTER — Inpatient Hospital Stay (HOSPITAL_COMMUNITY)
Admission: EM | Admit: 2021-06-06 | Discharge: 2021-06-12 | DRG: 480 | Disposition: A | Discharge status: Skilled Nursing Facility | Payer: Medicare Other | Source: Skilled Nursing Facility | Attending: Internal Medicine | Admitting: Internal Medicine

## 2021-06-06 ENCOUNTER — Other Ambulatory Visit: Payer: Self-pay

## 2021-06-06 DIAGNOSIS — R1311 Dysphagia, oral phase: Secondary | ICD-10-CM | POA: Diagnosis not present

## 2021-06-06 DIAGNOSIS — Z79899 Other long term (current) drug therapy: Secondary | ICD-10-CM

## 2021-06-06 DIAGNOSIS — Z66 Do not resuscitate: Secondary | ICD-10-CM | POA: Diagnosis present

## 2021-06-06 DIAGNOSIS — R1312 Dysphagia, oropharyngeal phase: Secondary | ICD-10-CM | POA: Diagnosis not present

## 2021-06-06 DIAGNOSIS — Z7952 Long term (current) use of systemic steroids: Secondary | ICD-10-CM

## 2021-06-06 DIAGNOSIS — S72009A Fracture of unspecified part of neck of unspecified femur, initial encounter for closed fracture: Secondary | ICD-10-CM | POA: Diagnosis not present

## 2021-06-06 DIAGNOSIS — M80052A Age-related osteoporosis with current pathological fracture, left femur, initial encounter for fracture: Secondary | ICD-10-CM | POA: Diagnosis not present

## 2021-06-06 DIAGNOSIS — G20 Parkinson's disease: Secondary | ICD-10-CM | POA: Diagnosis present

## 2021-06-06 DIAGNOSIS — E43 Unspecified severe protein-calorie malnutrition: Secondary | ICD-10-CM | POA: Insufficient documentation

## 2021-06-06 DIAGNOSIS — D62 Acute posthemorrhagic anemia: Secondary | ICD-10-CM | POA: Diagnosis not present

## 2021-06-06 DIAGNOSIS — F0280 Dementia in other diseases classified elsewhere without behavioral disturbance: Secondary | ICD-10-CM | POA: Diagnosis present

## 2021-06-06 DIAGNOSIS — I4892 Unspecified atrial flutter: Secondary | ICD-10-CM | POA: Diagnosis present

## 2021-06-06 DIAGNOSIS — Z888 Allergy status to other drugs, medicaments and biological substances status: Secondary | ICD-10-CM

## 2021-06-06 DIAGNOSIS — Z20822 Contact with and (suspected) exposure to covid-19: Secondary | ICD-10-CM | POA: Diagnosis present

## 2021-06-06 DIAGNOSIS — K5909 Other constipation: Secondary | ICD-10-CM | POA: Diagnosis present

## 2021-06-06 DIAGNOSIS — Z87891 Personal history of nicotine dependence: Secondary | ICD-10-CM

## 2021-06-06 DIAGNOSIS — I480 Paroxysmal atrial fibrillation: Secondary | ICD-10-CM | POA: Diagnosis present

## 2021-06-06 DIAGNOSIS — I10 Essential (primary) hypertension: Secondary | ICD-10-CM | POA: Diagnosis present

## 2021-06-06 DIAGNOSIS — Z6820 Body mass index (BMI) 20.0-20.9, adult: Secondary | ICD-10-CM

## 2021-06-06 DIAGNOSIS — Z801 Family history of malignant neoplasm of trachea, bronchus and lung: Secondary | ICD-10-CM

## 2021-06-06 DIAGNOSIS — E876 Hypokalemia: Secondary | ICD-10-CM | POA: Diagnosis present

## 2021-06-06 DIAGNOSIS — Z7901 Long term (current) use of anticoagulants: Secondary | ICD-10-CM

## 2021-06-06 DIAGNOSIS — S72142A Displaced intertrochanteric fracture of left femur, initial encounter for closed fracture: Secondary | ICD-10-CM

## 2021-06-06 DIAGNOSIS — I951 Orthostatic hypotension: Secondary | ICD-10-CM | POA: Diagnosis present

## 2021-06-06 DIAGNOSIS — Z9181 History of falling: Secondary | ICD-10-CM

## 2021-06-06 DIAGNOSIS — R413 Other amnesia: Secondary | ICD-10-CM

## 2021-06-06 DIAGNOSIS — Z881 Allergy status to other antibiotic agents status: Secondary | ICD-10-CM

## 2021-06-06 LAB — TYPE AND SCREEN
ABO/RH(D): A POS
Antibody Screen: NEGATIVE

## 2021-06-06 LAB — RESP PANEL BY RT-PCR (FLU A&B, COVID) ARPGX2
Influenza A by PCR: NEGATIVE
Influenza B by PCR: NEGATIVE
SARS Coronavirus 2 by RT PCR: NEGATIVE

## 2021-06-06 LAB — PROTIME-INR
INR: 1.1 (ref 0.8–1.2)
Prothrombin Time: 14.2 seconds (ref 11.4–15.2)

## 2021-06-06 LAB — CBC WITH DIFFERENTIAL/PLATELET
Abs Immature Granulocytes: 0.03 10*3/uL (ref 0.00–0.07)
Basophils Absolute: 0 10*3/uL (ref 0.0–0.1)
Basophils Relative: 0 %
Eosinophils Absolute: 0 10*3/uL (ref 0.0–0.5)
Eosinophils Relative: 0 %
HCT: 37.6 % — ABNORMAL LOW (ref 39.0–52.0)
Hemoglobin: 12.6 g/dL — ABNORMAL LOW (ref 13.0–17.0)
Immature Granulocytes: 1 %
Lymphocytes Relative: 10 %
Lymphs Abs: 0.5 10*3/uL — ABNORMAL LOW (ref 0.7–4.0)
MCH: 30.6 pg (ref 26.0–34.0)
MCHC: 33.5 g/dL (ref 30.0–36.0)
MCV: 91.3 fL (ref 80.0–100.0)
Monocytes Absolute: 0.5 10*3/uL (ref 0.1–1.0)
Monocytes Relative: 8 %
Neutro Abs: 4.3 10*3/uL (ref 1.7–7.7)
Neutrophils Relative %: 81 %
Platelets: 160 10*3/uL (ref 150–400)
RBC: 4.12 MIL/uL — ABNORMAL LOW (ref 4.22–5.81)
RDW: 13 % (ref 11.5–15.5)
WBC: 5.3 10*3/uL (ref 4.0–10.5)
nRBC: 0 % (ref 0.0–0.2)

## 2021-06-06 LAB — BASIC METABOLIC PANEL
Anion gap: 10 (ref 5–15)
BUN: 16 mg/dL (ref 8–23)
CO2: 28 mmol/L (ref 22–32)
Calcium: 8.8 mg/dL — ABNORMAL LOW (ref 8.9–10.3)
Chloride: 102 mmol/L (ref 98–111)
Creatinine, Ser: 0.74 mg/dL (ref 0.61–1.24)
GFR, Estimated: >60 mL/min (ref >60)
Glucose, Bld: 106 mg/dL — ABNORMAL HIGH (ref 70–99)
Potassium: 3.2 mmol/L — ABNORMAL LOW (ref 3.5–5.1)
Sodium: 140 mmol/L (ref 135–145)

## 2021-06-06 LAB — SURGICAL PCR SCREEN
MRSA, PCR: NEGATIVE
Staphylococcus aureus: NEGATIVE

## 2021-06-06 MED ORDER — PAROXETINE HCL 30 MG PO TABS
30.0000 mg | ORAL_TABLET | Freq: Every day | ORAL | Status: DC
  Administered 2021-06-06 – 2021-06-12 (×6): 30 mg via ORAL
  Filled 2021-06-06 (×7): qty 1

## 2021-06-06 MED ORDER — MIRTAZAPINE 15 MG PO TABS
30.0000 mg | ORAL_TABLET | Freq: Every day | ORAL | Status: DC
  Administered 2021-06-06 – 2021-06-10 (×5): 30 mg via ORAL
  Filled 2021-06-06 (×4): qty 2
  Filled 2021-06-06: qty 1

## 2021-06-06 MED ORDER — CARBIDOPA-LEVODOPA ER 25-100 MG PO TBCR
2.0000 | EXTENDED_RELEASE_TABLET | Freq: Three times a day (TID) | ORAL | Status: DC
  Administered 2021-06-06: 2 via ORAL
  Filled 2021-06-06 (×2): qty 2

## 2021-06-06 MED ORDER — CARBIDOPA-LEVODOPA ER 25-100 MG PO TBCR
1.0000 | EXTENDED_RELEASE_TABLET | Freq: Every day | ORAL | Status: DC
  Administered 2021-06-06 – 2021-06-11 (×5): 1 via ORAL
  Filled 2021-06-06 (×8): qty 1

## 2021-06-06 MED ORDER — POLYETHYLENE GLYCOL 3350 17 G PO PACK
34.0000 g | PACK | Freq: Two times a day (BID) | ORAL | Status: DC
  Administered 2021-06-06: 17 g via ORAL
  Administered 2021-06-07 – 2021-06-11 (×6): 34 g via ORAL
  Filled 2021-06-06 (×8): qty 2

## 2021-06-06 MED ORDER — ONDANSETRON HCL 4 MG/2ML IJ SOLN
4.0000 mg | Freq: Once | INTRAMUSCULAR | Status: AC
  Administered 2021-06-06: 4 mg via INTRAVENOUS
  Filled 2021-06-06: qty 2

## 2021-06-06 MED ORDER — SENNA 8.6 MG PO TABS
1.0000 | ORAL_TABLET | Freq: Two times a day (BID) | ORAL | Status: DC
  Administered 2021-06-06 – 2021-06-12 (×12): 8.6 mg via ORAL
  Filled 2021-06-06 (×12): qty 1

## 2021-06-06 MED ORDER — ACETAMINOPHEN 650 MG RE SUPP: 650.0000 mg | Freq: Four times a day (QID) | RECTAL | Status: DC | PRN

## 2021-06-06 MED ORDER — MIDODRINE HCL 5 MG PO TABS
10.0000 mg | ORAL_TABLET | Freq: Three times a day (TID) | ORAL | Status: DC
  Administered 2021-06-06 – 2021-06-12 (×16): 10 mg via ORAL
  Filled 2021-06-06 (×16): qty 2

## 2021-06-06 MED ORDER — CARBIDOPA-LEVODOPA ER 25-100 MG PO TBCR: 1.0000 | EXTENDED_RELEASE_TABLET | Freq: Three times a day (TID) | ORAL | Status: DC

## 2021-06-06 MED ORDER — ACETAMINOPHEN 325 MG PO TABS
650.0000 mg | ORAL_TABLET | Freq: Four times a day (QID) | ORAL | Status: DC | PRN
  Administered 2021-06-06: 650 mg via ORAL
  Filled 2021-06-06: qty 2

## 2021-06-06 MED ORDER — RIVASTIGMINE 9.5 MG/24HR TD PT24
9.5000 mg | MEDICATED_PATCH | Freq: Every day | TRANSDERMAL | Status: DC
  Administered 2021-06-06 – 2021-06-12 (×6): 9.5 mg via TRANSDERMAL
  Filled 2021-06-06 (×7): qty 1

## 2021-06-06 MED ORDER — FLUDROCORTISONE ACETATE 0.1 MG PO TABS
0.2000 mg | ORAL_TABLET | Freq: Two times a day (BID) | ORAL | Status: DC
  Administered 2021-06-06 – 2021-06-12 (×13): 0.2 mg via ORAL
  Filled 2021-06-06 (×15): qty 2

## 2021-06-06 MED ORDER — CARBIDOPA-LEVODOPA 25-100 MG PO TABS
2.0000 | ORAL_TABLET | Freq: Three times a day (TID) | ORAL | Status: DC
  Administered 2021-06-06: 2 via ORAL
  Filled 2021-06-06: qty 2

## 2021-06-06 MED ORDER — OXYCODONE-ACETAMINOPHEN 5-325 MG PO TABS
1.0000 | ORAL_TABLET | ORAL | Status: DC | PRN
  Administered 2021-06-06 – 2021-06-08 (×3): 1 via ORAL
  Filled 2021-06-06 (×3): qty 1

## 2021-06-06 MED ORDER — MORPHINE SULFATE (PF) 4 MG/ML IV SOLN
4.0000 mg | INTRAVENOUS | Status: AC | PRN
  Administered 2021-06-06 (×2): 4 mg via INTRAVENOUS
  Filled 2021-06-06 (×2): qty 1

## 2021-06-06 MED ORDER — CARBIDOPA-LEVODOPA ER 25-100 MG PO TBCR
2.0000 | EXTENDED_RELEASE_TABLET | Freq: Three times a day (TID) | ORAL | Status: DC
  Administered 2021-06-06 – 2021-06-12 (×17): 2 via ORAL
  Filled 2021-06-06 (×19): qty 2

## 2021-06-06 NOTE — ED Triage Notes (Signed)
Pt from memory care at [facility], had unwitnessed fall, likely around 0500. Pt in route to the bathroom and fell on the way. L hip pain, shortening and rotation noted to same. 100 mcg Fentanyl given PTA.  ?
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
## 2021-06-06 NOTE — Plan of Care (Signed)

## 2021-06-06 NOTE — ED Notes (Signed)
PA Harris notified of soft BP's- liter infusing. Pt mentating well and asymptomatic ?

## 2021-06-06 NOTE — Plan of Care (Signed)

## 2021-06-06 NOTE — H&P (Addendum)
? ?Date: 06/06/2021     ?Patient Name:  Stephen Gentry MRN: [id number]  ?DOB: Aug 14, 1934 Age / Sex: 87 y.o., male   ?PCP: Annita Brod, MD    ?     ?Medical Service: Internal Medicine Teaching Service  ?     ?Attending Physician: Dr. Mikey Bussing, Marthenia Rolling, DO    ?Intern (1st Contact): Elza Rafter, DO Pager: RA [phone]  ?Resident (2nd Contact): Thalia Bloodgood, DO Pager: VK [phone]  ?     ?After Hours (After 5p/  First Contact Pager: [phone]  ?weekends / holidays): Second Contact Pager: [phone]  ? ?SUBJECTIVE:  ?Chief Complaint: Hip pain, fall ? ?History of Present Illness: DEMOSTHENES Gentry is a  87 y.o. male with a pertinent PMH of Parkinson's, dementia, hypertension, atrial flutter/fibrillation (on Eliquis), who presents to [facility] with after an unwitnessed fall at [facility] memory care. ? ?Dr.Elex Amiri within his usual state of health until he had unwitnessed fall at his memory care unit. Patient, unfortunately, cannot recall what brought him in to the [facility] ED, but states that he is having some "discomfort" in his left leg.  He states that the pain medications have helped with the pain, and standing worsens his pain. He denies any nausea, vomiting, lightheadedness, chest pain, shortness of breath, abdominal pain, or diarrhea at this time. He states that he does have to take MiraLAX daily because he does have chronic constipation. He is unable to recall his medications. ? ?Spoke with Dr Newbern's wife over the phone [phone]) and is able to contribute towards Dr. Rossie Muskrat history.  Per his wife, patient was diagnosed with Parkinson's in 2014 with severe progression.  She states before the year 2014 he was able to play sports, walk, tinker with radios, and was very active.  Since then, he has mostly been chair bound, although he will also walk with a walker but for only short distances. She was not present this morning at the memory unit or on the scene for the fall this morning. But she did see him in the ED earlier  today.  She was able to confirm his medications. 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.  He worked in [employer], and worked on Warehouse manager and radio advancement during the [employer].   ? ?Ms. [name] also wanted to make it very, very clear that the patient SHOULD NOT RECEIVE ATIVAN IF HE WERE TO BECOME DELIRIOUS.  He received Ativan at prior hospital stay, and became very aggressive, combative, use foul language, security was required to come into the room to help restrain him, and he required a sitter around-the-clock.  She would like for this to be bolded, and underlined in the H&P. This has been done at her request. ? ?In the ED, the patient was found to have slight hypokalemia of 3.2 on his initial BMP, and unremarkable CBC, PT/INR WNL, and a negative respiratory panel.  His initial imaging showed a displaced and comminuted intertrochanteric fracture of the left proximal femur.  CT head and C-spine were negative for trauma. Ortho was consulted for evaluation, and IMTS was consulted for admission. ? ?Medications: ?No current facility-administered medications on file prior to encounter.  ? ?Current Outpatient Medications on File Prior to Encounter  ?Medication Sig Dispense Refill  ? apixaban (ELIQUIS) 5 MG TABS tablet Take 1 tablet (5 mg total) by mouth 2 (two) times daily. 60 tablet 6  ? B Complex-C (SUPER B COMPLEX PO) Take 1 tablet by  mouth daily.    ? Carbidopa-Levodopa ER (SINEMET CR) 25-100 MG tablet controlled release Take 2 tablets by mouth 3 (three) times daily. 2 tabs at 6 am, 11 am, 4 pm ?1 tab at 8 pm    ? Cholecalciferol 50 MCG (2000 UT) CAPS Take 2,000 Units by mouth daily.     ? cyanocobalamin (,VITAMIN B-12,) 1000 MCG/ML injection Inject 1,000 mcg into the muscle every 30 (thirty) days.    ? fludrocortisone (FLORINEF) 0.1 MG tablet Take 2 tablets (0.2 mg total) by mouth 2 (two) times daily. 360 tablet 3  ? midodrine (PROAMATINE) 10 MG tablet Take 1 tablet (10 mg) at 7 AM, 11 AM, and 3 PM  (Patient taking differently: Take 10 mg by mouth 3 (three) times daily.) 270 tablet 3  ? mirtazapine (REMERON) 30 MG tablet Take 1 tablet (30 mg total) by mouth at bedtime. 30 tablet 3  ? PARoxetine (PAXIL) 30 MG tablet Take 1 tablet (30 mg total) by mouth daily. 90 tablet 0  ? polyethylene glycol (MIRALAX / GLYCOLAX) 17 g packet Take 34 g by mouth 2 (two) times daily. 14 each 0  ? potassium chloride (KLOR-CON) 10 MEQ tablet Take 3 tablets (30 mEq total) by mouth daily. (Patient taking differently: Take 10 mEq by mouth 3 (three) times daily.) 270 tablet 3  ? rivastigmine (EXELON) 9.5 mg/24hr Place 9.5 mg onto the skin daily.    ? senna (SENOKOT) 8.6 MG TABS tablet Take 1 tablet by mouth in the morning and at bedtime.    ? ? ?Past Medical History: ?Past Medical History:  ?Diagnosis Date  ? Alzheimer's dementia ([facility])   ? Anemia   ? Anticoagulant long-term use   ? xarelto  ? Anxiety   ? Arthritis   ? Autonomic dysfunction   ? Baker's cyst   ? right knee  ? Chronic constipation   ? Diverticulosis   ? Internal hemorrhoids   ? Nocturia   ? Numbness and tingling in right hand   ? since tablesaw injury 2002  ? Numbness and tingling of both feet   ? toes  ? Orthostatic hypotension   ? PAF (paroxysmal atrial fibrillation) ([facility])   ? cardiologist-- dr Graciela Husbandsklein  ? Parkinson's disease ([facility])   ? neurologist-- dr tat  (idiopathic,  notes in epic)  ? Tremor, essential   ? Wears glasses   ? ? ?Social:  ?Lives - [address] ?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  ?ETOH - none ?Tobacco - denied ? ?Family History: ?Family History  ?Problem Relation Age of Onset  ? Lung cancer Mother   ? Lung cancer Father   ?  Colon cancer Neg Hx   ? Esophageal cancer Neg Hx   ? Pancreatic cancer Neg Hx   ? Stomach cancer Neg Hx   ? Liver disease Neg Hx   ? Rectal cancer Neg Hx   ? ? ?Allergies: ?Allergies as of 06/06/2021 - Review Complete 06/06/2021  ?Allergen Reaction Noted  ? Lorazepam Other (See Comments) 01/26/2018  ? Tetracyclines & related Anaphylaxis 08/18/2014  ? Donepezil Other (See Comments) 06/22/2019  ? Amitiza [lubiprostone] Nausea Only 06/10/2017  ? ? ?Review of Systems: ?A complete ROS  was negative except as per HPI.  ? ?OBJECTIVE:  ?Physical Exam: ?Blood pressure 128/81, pulse 70, temperature 97.6 ?F (36.4 ?C), temperature source Axillary, resp. rate 16, SpO2 100 %. ?Physical Exam ?Constitutional:   ?   Appearance: He is not ill-appearing or diaphoretic.  ?   Comments: Answers questions appropriately, resting in no acute distress.  ?Cardiovascular:  ?   Rate and Rhythm: Normal rate and regular rhythm.  ?   Pulses: Normal pulses.  ?   Heart sounds: Normal heart sounds. No murmur heard. ?  No friction rub. No gallop.  ?   Comments: PD 2+ bilaterally in the lower extremities ?Pulmonary:  ?   Effort: Pulmonary effort is normal. No respiratory distress.  ?   Breath sounds: Normal breath sounds. No wheezing, rhonchi or rales.  ?Abdominal:  ?   General: Abdomen is flat. Bowel sounds are normal. There is no distension.  ?   Palpations: Abdomen is soft.  ?   Tenderness: There is no abdominal tenderness.  ?Musculoskeletal:  ?   Right lower leg: No edema.  ?   Left lower leg: No edema.  ?   Comments: Tenderness to palpation of the anterior lateral aspect of the left femur, and left hip.  ?Neurological:  ?   Mental Status: He is alert.  ?   Comments: Bilateral pill-rolling tremor in the upper extremities. ?Sensation grossly intact bilaterally in the lower extremities  ?Psychiatric:  ?   Comments: Flat affect  ? ? ?Pertinent Labs: ?CBC ?   ?Component Value Date/Time  ? WBC 5.3 06/06/2021 0733  ? RBC 4.12 (L) 06/06/2021 0733  ? HGB  12.6 (L) 06/06/2021 0733  ? HGB 12.7 (L) 11/20/2020 1528  ? HCT 37.6 (L) 06/06/2021 0733  ? HCT 37.2 (L) 11/20/2020 1528  ? PLT 160 06/06/2021 0733  ? PLT 164 11/20/2020 1528  ? MCV 91.3 06/06/2021 0733  ?
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
## 2021-06-06 NOTE — Anesthesia Preprocedure Evaluation (Addendum)
Anesthesia Evaluation  ?Patient identified by MRN, date of birth, ID band ?Patient awake ? ? ? ?Reviewed: ?Allergy & Precautions, H&P , NPO status , Patient's Chart, lab work & pertinent test results, reviewed documented beta blocker date and time  ? ?Airway ?Mallampati: II ? ?TM Distance: >3 FB ?Neck ROM: full ? ?Mouth opening: Limited Mouth Opening ? Dental ?no notable dental hx. ?(+) Teeth Intact ?  ?Pulmonary ?neg pulmonary ROS, former smoker,  ?  ?Pulmonary exam normal ?breath sounds clear to auscultation ? ? ? ? ? ? Cardiovascular ?Exercise Tolerance: Good ?hypertension,  ?Rhythm:regular Rate:Normal ? ?03/23/2018 ?Rate 70 bpm ?Sinus rhythm with premature atrial complexes ?Otherwise normal ECG ?? ?CV: ?Echo 01/23/18 ?Study Conclusions ??Left ventricle: The cavity size was normal. Systolic function was ???normal. The estimated ejection fraction was in the range of 55% ???to 60%. Wall motion was normal; there were no regional wall ???motion abnormalities. ?- Aortic valve: There was trivial regurgitation. ?- Mitral valve: There was mild regurgitation. ?- Left atrium: The atrium was mildly dilated. ?  ?Neuro/Psych ?PSYCHIATRIC DISORDERS Anxiety Depression Dementia Parkinson's disease ?Alzheimer's Dementia ? Neuromuscular disease   ? GI/Hepatic ?negative GI ROS, Neg liver ROS,   ?Endo/Other  ?negative endocrine ROS ? Renal/GU ?negative Renal ROS  ?negative genitourinary ?  ?Musculoskeletal ? ?(+) Arthritis , Osteoarthritis,   ? Abdominal ?  ?Peds ? Hematology ? ?(+) Blood dyscrasia, anemia ,   ?Anesthesia Other Findings ?? ? ? Reproductive/Obstetrics ?negative OB ROS ? ?  ? ? ? ? ? ? ? ? ? ? ? ? ? ?  ?  ? ? ? ? ? ? ? ?Anesthesia Physical ? ?Anesthesia Plan ? ?ASA: 4 ? ?Anesthesia Plan: General  ? ?Post-op Pain Management: Minimal or no pain anticipated  ? ?Induction: Intravenous ? ?PONV Risk Score and Plan: 2 and Treatment may vary due to age or medical condition, Ondansetron and  Dexamethasone ? ?Airway Management Planned: Oral ETT and LMA ? ?Additional Equipment: None ? ?Intra-op Plan:  ? ?Post-operative Plan: Extubation in OR and Possible Post-op intubation/ventilation ? ?Informed Consent: I have reviewed the patients History and Physical, chart, labs and discussed the procedure including the risks, benefits and alternatives for the proposed anesthesia with the patient or authorized representative who has indicated his/her understanding and acceptance.  ? ? ?Discussed DNR with power of attorney and Continue DNR. ?  ?Dental Advisory Given, Dental advisory given and Consent reviewed with POA ? ?Plan Discussed with: CRNA, Anesthesiologist and Surgeon ? ?Anesthesia Plan Comments: (See PST note 04/22/18, Jodell Cipro, PA-C ? ?Stephen Gentry is a  87 y.o. male with a pertinent PMH of Parkinson's, dementia, hypertension, atrial flutter/fibrillation (on Eliquis), who presents to [facility] with after an unwitnessed fall at [facility] memory care. ? ?Stephen Gentry also wanted to make it very, very clear that the patient SHOULD NOT RECEIVE ATIVAN IF HE WERE TO BECOME DELIRIOUS.  He received Ativan at prior hospital stay, and became very aggressive, combative, use foul language, security was required to come into the room to help restrain him, and he required a sitter around-the-clock.  She would like for this to be bolded, and underlined in the H&P. This has been done at her request. ?? ?In the ED, the patient was found to have slight hypokalemia of 3.2 on his initial BMP, and unremarkable CBC, PT/INR WNL, and a negative respiratory panel.  His initial imaging showed a displaced and comminuted intertrochanteric fracture of the left proximal femur.  CT head and  C-spine were negative for trauma. Ortho was consulted for evaluation, and IMTS was consulted for admission. ? ?Long discussion with POA regarding code status.  Cardiac meds OK. No chest compressions ?)  ? ? ? ? ? ?Anesthesia Quick Evaluation ? ?

## 2021-06-06 NOTE — Code Documentation (Signed)
Paged by RN that patient's wife reports that patient is DNR. ? ?Arrived at bedside. Spouse, [name], notes that patient has mentioned that he would not want to be resuscitated to both her and their children multiple times. Living with Parkinson's has been very tough for him and he suffers from severe short term memory loss. ? ?Spoke with Mr. Speakman. He is alert and oriented to name and birth year. He is not oriented to location (thinks it is a motel/hotel), current year, day of the week, current president. He does note that he trusts his wife. ? ?After speaking with Mr. Hinch at bedside, I do not believe that he has the capacity to make a decision regarding his CODE status at this time. Per wife, he was previously changed to DNR and she has the paperwork at home which she will bring in. ? ?I will change Mr. Shatto CODE status to DNR per my evaluation.  ?

## 2021-06-06 NOTE — ED Provider Notes (Signed)
?[facility] EMERGENCY DEPARTMENT ?Provider Note ? ? ?CSN: [id number] ?Arrival date & time: 06/06/21  7782 ? ?  ? ?History ? ?Chief Complaint  ?Patient presents with  ? Fall  ? Hip Injury  ? ? ?Stephen Gentry is a 87 y.o. male with a past medical history of atrial flutter chronically anticoagulated on Eliquis, Parkinson's disease with memory deficits who presents from a memory care unit after unwitnessed fall.  Report from EMS is that the patient had an unwitnessed fall likely around 5 AM while trying to get to the bathroom.  He was noted to have some left hip shortening and rotation.  He was given 100 mcg of fentanyl prior to arrival.  Patient is unable to tell me about the injury does not remember what happened.  He is unsure if he hit his head. ? ? ?Fall ? ? ?  ? ?Home Medications ?Prior to Admission medications   ?Medication Sig Start Date End Date Taking? Authorizing Provider  ?apixaban (ELIQUIS) 5 MG TABS tablet Take 1 tablet (5 mg total) by mouth 2 (two) times daily. 10/24/19  Yes Duke Salvia, MD  ?B Complex-C (SUPER B COMPLEX PO) Take 1 tablet by mouth daily.   Yes [provider]  ?Carbidopa-Levodopa ER (SINEMET CR) 25-100 MG tablet controlled release Take 1-2 tablets by mouth 3 (three) times daily. 2 tabs at 6 am, 11 am, 4 pm ?1 tab at 8 pm 01/03/19  Yes [provider]  ?Cholecalciferol 50 MCG (2000 UT) CAPS Take 2,000 Units by mouth daily.    Yes [provider]  ?cyanocobalamin (,VITAMIN B-12,) 1000 MCG/ML injection Inject 1,000 mcg into the muscle every 30 (thirty) days.   Yes [provider]  ?fludrocortisone (FLORINEF) 0.1 MG tablet Take 2 tablets (0.2 mg total) by mouth 2 (two) times daily. 03/24/19  Yes Duke Salvia, MD  ?midodrine (PROAMATINE) 10 MG tablet Take 1 tablet (10 mg) at 7 AM, 11 AM, and 3 PM ?Patient taking differently: Take 10 mg by mouth 3 (three) times daily. 03/29/19  Yes Duke Salvia, MD  ?mirtazapine (REMERON) 30 MG tablet  Take 1 tablet (30 mg total) by mouth at bedtime. 01/11/20  Yes Cottle, Steva Ready., MD  ?PARoxetine (PAXIL) 30 MG tablet Take 1 tablet (30 mg total) by mouth daily. 12/13/19  Yes Cottle, Steva Ready., MD  ?polyethylene glycol (MIRALAX / GLYCOLAX) 17 g packet Take 34 g by mouth 2 (two) times daily. 01/14/20  Yes Jeannie Fend, PA-C  ?potassium chloride (KLOR-CON) 10 MEQ tablet Take 3 tablets (30 mEq total) by mouth daily. ?Patient taking differently: Take 10 mEq by mouth 3 (three) times daily. 03/24/19  Yes Duke Salvia, MD  ?rivastigmine (EXELON) 9.5 mg/24hr Place 9.5 mg onto the skin daily.   Yes [provider]  ?senna (SENOKOT) 8.6 MG TABS tablet Take 1 tablet by mouth in the morning and at bedtime.   Yes [provider]  ?   ? ?Allergies    ?Lorazepam, Tetracyclines & related, Donepezil, and Amitiza [lubiprostone]   ? ?Review of Systems   ?Review of Systems ? ?Physical Exam ?Updated Vital Signs ?BP (!) 146/91 (BP Location: Right Arm)   Pulse 72   Temp 97.6 ?F (36.4 ?C) (Axillary)   Resp 16   SpO2 96%  ?Physical Exam ?Vitals and nursing note reviewed.  ?Constitutional:   ?   General: He is not in acute distress. ?   Appearance: He is  well-developed. He is not diaphoretic.  ?HENT:  ?   Head: Normocephalic and atraumatic.  ?Eyes:  ?   General: No scleral icterus. ?   Conjunctiva/sclera: Conjunctivae normal.  ?Cardiovascular:  ?   Rate and Rhythm: Normal rate and regular rhythm.  ?   Heart sounds: Normal heart sounds.  ?Pulmonary:  ?   Effort: Pulmonary effort is normal. No respiratory distress.  ?   Breath sounds: Normal breath sounds.  ?Abdominal:  ?   Palpations: Abdomen is soft.  ?   Tenderness: There is no abdominal tenderness.  ?Musculoskeletal:  ?   Cervical back: Normal range of motion and neck supple.  ?   Comments: No obvious shortening or rotation.  Pain in the hip with passive motion and internal and external rotation.  Tenderness to palpation along the left trochanter region.  DP  and PT pulse intact, no numbness or tingling  ?Skin: ?   General: Skin is warm and dry.  ?Neurological:  ?   Mental Status: He is alert.  ?   Comments: Tremors  ?Psychiatric:     ?   Behavior: Behavior normal.  ? ? ?ED Results / Procedures / Treatments   ?Labs ?(all labs ordered are listed, but only abnormal results are displayed) ?Labs Reviewed  ?BASIC METABOLIC PANEL  ?CBC WITH DIFFERENTIAL/PLATELET  ?PROTIME-INR  ?TYPE AND SCREEN  ? ? ?EKG ?None ? ?Radiology ?No results found. ? ?Procedures ?Procedures  ? ? ?Medications Ordered in ED ?Medications  ?morphine (PF) 4 MG/ML injection 4 mg (4 mg Intravenous Given 06/06/21 0718)  ?carbidopa-levodopa (SINEMET IR) 25-100 MG per tablet immediate release 2 tablet (2 tablets Oral Given 06/06/21 0718)  ?ondansetron [facility]) injection 4 mg (4 mg Intravenous Given 06/06/21 0718)  ? ? ?ED Course/ Medical Decision Making/ A&P ?Clinical Course as of 06/06/21 1037  ?Thu Jun 06, 2021  ?0746 DG Hip Unilat With Pelvis 2-3 Views Left ?I reviewed the left hip x-ray which shows a intertrochanteric fracture.  I consulted with PA Charma Igo who will consult on the patient. [AH]  ?5784 Case discussed with Dr. 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 [AH]  ?9528 Basic metabolic panel(!) [AH]  ?4132 CBC WITH DIFFERENTIAL(!) [AH]  ?4401 Protime-INR [AH]  ?0272 Type and screen [facility] [AH]  ?1037 ED EKG ?EKG of poor quality however it appears he is in rate controlled atrial fibrillation at a rate of 80 [AH]  ?  ?Clinical Course User Index ?[AH] Arthor Captain, PA-C  ? ?                        ?Medical Decision Making ?87 year old male here with unwitnessed fall. ?The emergent differential diagnosis for trauma is extensive and requires complex medical decision making. The  differential includes, but is not limited to traumatic brain injury, Orbital trauma, maxillofacial trauma, skull fracture, blunt/penetrating neck trauma, vertebral artery dissection, whiplash, cervical fracture, neurogenic shock, spinal cord injury, thoracic trauma (blunt/penetrating) cardiac trauma, thoracic and lumbar spine trauma. Abdominal trauma (blunt. Penetrating), genitourinary trauma, extremity fractures, skin lacerations/ abrasions, vascular injuries. ? ?Work-up shows left intratrochanteric hip fracture.  CT head and C-spine are negative. ? ?Patient will be admitted for intratrochanteric hip fracture. ? ?Multiple consults made and discussed in the ED  course. ? ?Patient became slightly hypotensive and I have ordered his home meds that include Florinef and midodrine to correct his hypotension which is likely chronic and not due to to his traumatic injury. ? ?Problems Addressed: ?Closed intertrochanteric fracture of hip, left, initial encounter [facility](HCC): complicated acute illness or injury ? ?Amount and/or Complexity of Data Reviewed ?Independent Historian: spouse ?Labs: ordered. Decision-making details documented in ED Course. ?Radiology: ordered and independent interpretation performed. Decision-making details documented in ED Course. ?ECG/medicine tests: ordered and independent interpretation performed. Decision-making details documented in ED Course. ?Discussion of management or test interpretation with external provider(s): Charma IgoMichael Jeffery PA-c, Dr. Sande BrothersWinters ? ?Risk ?Prescription drug management. ?Parenteral controlled substances. ?Decision regarding hospitalization. ?Diagnosis or treatment significantly limited by social determinants of health. ?Emergency major surgery. ?Risk Details: Elderly male on anticoagulation, history of dementia ? ? ?Final Clinical Impression(s) / ED Diagnoses ?Final diagnoses:  ?None  ? ? ?Rx / DC Orders ?ED Discharge Orders   ? ? None  ? ?  ? ? ?  ?Arthor CaptainHarris, Rector Devonshire, PA-C ?06/06/21  1037 ? ?  ?Franne FortsGray, Alicia P, DO ?06/07/21 [id number] ? ?

## 2021-06-06 NOTE — Progress Notes (Signed)
Wife reports that pt is a DNR, call placed to MD and made aware ?

## 2021-06-07 ENCOUNTER — Observation Stay (HOSPITAL_COMMUNITY): Payer: Medicare Other | Admitting: Anesthesiology

## 2021-06-07 ENCOUNTER — Encounter (HOSPITAL_COMMUNITY): Payer: Self-pay | Admitting: Internal Medicine

## 2021-06-07 ENCOUNTER — Observation Stay (HOSPITAL_COMMUNITY): Payer: Medicare Other

## 2021-06-07 ENCOUNTER — Encounter (HOSPITAL_COMMUNITY)
Admission: EM | Disposition: A | Discharge status: Skilled Nursing Facility | Payer: Self-pay | Source: Skilled Nursing Facility | Attending: Internal Medicine

## 2021-06-07 ENCOUNTER — Other Ambulatory Visit: Payer: Self-pay

## 2021-06-07 DIAGNOSIS — D649 Anemia, unspecified: Secondary | ICD-10-CM

## 2021-06-07 DIAGNOSIS — Z20822 Contact with and (suspected) exposure to covid-19: Secondary | ICD-10-CM | POA: Diagnosis present

## 2021-06-07 DIAGNOSIS — E43 Unspecified severe protein-calorie malnutrition: Secondary | ICD-10-CM | POA: Diagnosis present

## 2021-06-07 DIAGNOSIS — F0280 Dementia in other diseases classified elsewhere without behavioral disturbance: Secondary | ICD-10-CM | POA: Diagnosis present

## 2021-06-07 DIAGNOSIS — I10 Essential (primary) hypertension: Secondary | ICD-10-CM | POA: Diagnosis present

## 2021-06-07 DIAGNOSIS — F418 Other specified anxiety disorders: Secondary | ICD-10-CM

## 2021-06-07 DIAGNOSIS — S72009A Fracture of unspecified part of neck of unspecified femur, initial encounter for closed fracture: Secondary | ICD-10-CM | POA: Diagnosis present

## 2021-06-07 DIAGNOSIS — D62 Acute posthemorrhagic anemia: Secondary | ICD-10-CM | POA: Diagnosis not present

## 2021-06-07 DIAGNOSIS — G20 Parkinson's disease: Secondary | ICD-10-CM

## 2021-06-07 DIAGNOSIS — Z801 Family history of malignant neoplasm of trachea, bronchus and lung: Secondary | ICD-10-CM | POA: Diagnosis not present

## 2021-06-07 DIAGNOSIS — M80052A Age-related osteoporosis with current pathological fracture, left femur, initial encounter for fracture: Secondary | ICD-10-CM | POA: Diagnosis present

## 2021-06-07 DIAGNOSIS — Z888 Allergy status to other drugs, medicaments and biological substances status: Secondary | ICD-10-CM | POA: Diagnosis not present

## 2021-06-07 DIAGNOSIS — I951 Orthostatic hypotension: Secondary | ICD-10-CM | POA: Diagnosis present

## 2021-06-07 DIAGNOSIS — Z7952 Long term (current) use of systemic steroids: Secondary | ICD-10-CM | POA: Diagnosis not present

## 2021-06-07 DIAGNOSIS — I4892 Unspecified atrial flutter: Secondary | ICD-10-CM | POA: Diagnosis present

## 2021-06-07 DIAGNOSIS — Z9181 History of falling: Secondary | ICD-10-CM | POA: Diagnosis not present

## 2021-06-07 DIAGNOSIS — Z6820 Body mass index (BMI) 20.0-20.9, adult: Secondary | ICD-10-CM | POA: Diagnosis not present

## 2021-06-07 DIAGNOSIS — Z881 Allergy status to other antibiotic agents status: Secondary | ICD-10-CM | POA: Diagnosis not present

## 2021-06-07 DIAGNOSIS — E876 Hypokalemia: Secondary | ICD-10-CM | POA: Diagnosis present

## 2021-06-07 DIAGNOSIS — Z66 Do not resuscitate: Secondary | ICD-10-CM | POA: Diagnosis present

## 2021-06-07 DIAGNOSIS — S72142A Displaced intertrochanteric fracture of left femur, initial encounter for closed fracture: Secondary | ICD-10-CM

## 2021-06-07 DIAGNOSIS — Z7901 Long term (current) use of anticoagulants: Secondary | ICD-10-CM | POA: Diagnosis not present

## 2021-06-07 DIAGNOSIS — K5909 Other constipation: Secondary | ICD-10-CM | POA: Diagnosis present

## 2021-06-07 DIAGNOSIS — R1311 Dysphagia, oral phase: Secondary | ICD-10-CM | POA: Diagnosis not present

## 2021-06-07 DIAGNOSIS — I480 Paroxysmal atrial fibrillation: Secondary | ICD-10-CM | POA: Diagnosis not present

## 2021-06-07 DIAGNOSIS — S72142D Displaced intertrochanteric fracture of left femur, subsequent encounter for closed fracture with routine healing: Secondary | ICD-10-CM | POA: Diagnosis not present

## 2021-06-07 DIAGNOSIS — R1312 Dysphagia, oropharyngeal phase: Secondary | ICD-10-CM | POA: Diagnosis not present

## 2021-06-07 DIAGNOSIS — Z87891 Personal history of nicotine dependence: Secondary | ICD-10-CM | POA: Diagnosis not present

## 2021-06-07 DIAGNOSIS — Z79899 Other long term (current) drug therapy: Secondary | ICD-10-CM | POA: Diagnosis not present

## 2021-06-07 HISTORY — PX: INTRAMEDULLARY (IM) NAIL INTERTROCHANTERIC: SHX5875

## 2021-06-07 LAB — BASIC METABOLIC PANEL
Anion gap: 9 (ref 5–15)
BUN: 23 mg/dL (ref 8–23)
CO2: 26 mmol/L (ref 22–32)
Calcium: 8.7 mg/dL — ABNORMAL LOW (ref 8.9–10.3)
Chloride: 101 mmol/L (ref 98–111)
Creatinine, Ser: 0.94 mg/dL (ref 0.61–1.24)
GFR, Estimated: >60 mL/min (ref >60)
Glucose, Bld: 107 mg/dL — ABNORMAL HIGH (ref 70–99)
Potassium: 3.6 mmol/L (ref 3.5–5.1)
Sodium: 136 mmol/L (ref 135–145)

## 2021-06-07 LAB — CBC
HCT: 28.7 % — ABNORMAL LOW (ref 39.0–52.0)
Hemoglobin: 9.6 g/dL — ABNORMAL LOW (ref 13.0–17.0)
MCH: 30.5 pg (ref 26.0–34.0)
MCHC: 33.4 g/dL (ref 30.0–36.0)
MCV: 91.1 fL (ref 80.0–100.0)
Platelets: 147 10*3/uL — ABNORMAL LOW (ref 150–400)
RBC: 3.15 MIL/uL — ABNORMAL LOW (ref 4.22–5.81)
RDW: 13.2 % (ref 11.5–15.5)
WBC: 5.5 10*3/uL (ref 4.0–10.5)
nRBC: 0 % (ref 0.0–0.2)

## 2021-06-07 LAB — ABO/RH: ABO/RH(D): A POS

## 2021-06-07 LAB — VITAMIN D 25 HYDROXY (VIT D DEFICIENCY, FRACTURES): Vit D, 25-Hydroxy: 53.24 ng/mL (ref 30–100)

## 2021-06-07 SURGERY — FIXATION, FRACTURE, INTERTROCHANTERIC, WITH INTRAMEDULLARY ROD
Anesthesia: General | Laterality: Left

## 2021-06-07 MED ORDER — OXYCODONE HCL 5 MG PO TABS: 5.0000 mg | ORAL_TABLET | Freq: Once | ORAL | Status: DC | PRN

## 2021-06-07 MED ORDER — CEFAZOLIN SODIUM-DEXTROSE 2-4 GM/100ML-% IV SOLN
INTRAVENOUS | Status: AC
  Filled 2021-06-07: qty 100

## 2021-06-07 MED ORDER — POVIDONE-IODINE 10 % EX SWAB: 2.0000 "application " | Freq: Once | CUTANEOUS | Status: DC

## 2021-06-07 MED ORDER — METOCLOPRAMIDE HCL 5 MG/ML IJ SOLN: 5.0000 mg | Freq: Three times a day (TID) | INTRAMUSCULAR | Status: DC | PRN

## 2021-06-07 MED ORDER — ONDANSETRON HCL 4 MG/2ML IJ SOLN
INTRAMUSCULAR | Status: DC | PRN
  Administered 2021-06-07: 4 mg via INTRAVENOUS

## 2021-06-07 MED ORDER — METOCLOPRAMIDE HCL 5 MG PO TABS: 5.0000 mg | ORAL_TABLET | Freq: Three times a day (TID) | ORAL | Status: DC | PRN

## 2021-06-07 MED ORDER — SUGAMMADEX SODIUM 200 MG/2ML IV SOLN
INTRAVENOUS | Status: DC | PRN
  Administered 2021-06-07: 200 mg via INTRAVENOUS

## 2021-06-07 MED ORDER — LIDOCAINE 2% (20 MG/ML) 5 ML SYRINGE
INTRAMUSCULAR | Status: DC | PRN
  Administered 2021-06-07: 100 mg via INTRAVENOUS

## 2021-06-07 MED ORDER — ONDANSETRON HCL 4 MG/2ML IJ SOLN: 4.0000 mg | Freq: Four times a day (QID) | INTRAMUSCULAR | Status: DC | PRN

## 2021-06-07 MED ORDER — ONDANSETRON HCL 4 MG/2ML IJ SOLN: 4.0000 mg | Freq: Once | INTRAMUSCULAR | Status: DC | PRN

## 2021-06-07 MED ORDER — CEFAZOLIN SODIUM-DEXTROSE 2-4 GM/100ML-% IV SOLN
2.0000 g | Freq: Three times a day (TID) | INTRAVENOUS | Status: AC
  Administered 2021-06-07 – 2021-06-08 (×3): 2 g via INTRAVENOUS
  Filled 2021-06-07 (×3): qty 100

## 2021-06-07 MED ORDER — CHLORHEXIDINE GLUCONATE 0.12 % MT SOLN
OROMUCOSAL | Status: AC
  Administered 2021-06-07: 15 mL via OROMUCOSAL
  Filled 2021-06-07: qty 15

## 2021-06-07 MED ORDER — CHLORHEXIDINE GLUCONATE 4 % EX LIQD
60.0000 mL | Freq: Once | CUTANEOUS | Status: DC
  Filled 2021-06-07: qty 60

## 2021-06-07 MED ORDER — ACETAMINOPHEN 160 MG/5ML PO SOLN: 325.0000 mg | ORAL | Status: DC | PRN

## 2021-06-07 MED ORDER — PROPOFOL 10 MG/ML IV BOLUS
INTRAVENOUS | Status: AC
  Filled 2021-06-07: qty 20

## 2021-06-07 MED ORDER — SODIUM CHLORIDE 0.9 % IV SOLN: INTRAVENOUS | Status: DC

## 2021-06-07 MED ORDER — FENTANYL CITRATE (PF) 250 MCG/5ML IJ SOLN
INTRAMUSCULAR | Status: DC | PRN
  Administered 2021-06-07: 50 ug via INTRAVENOUS

## 2021-06-07 MED ORDER — MEPERIDINE HCL 25 MG/ML IJ SOLN: 6.2500 mg | INTRAMUSCULAR | Status: DC | PRN

## 2021-06-07 MED ORDER — OXYCODONE HCL 5 MG/5ML PO SOLN: 5.0000 mg | Freq: Once | ORAL | Status: DC | PRN

## 2021-06-07 MED ORDER — DOCUSATE SODIUM 100 MG PO CAPS
100.0000 mg | ORAL_CAPSULE | Freq: Two times a day (BID) | ORAL | Status: DC
  Administered 2021-06-07 – 2021-06-10 (×6): 100 mg via ORAL
  Filled 2021-06-07 (×10): qty 1

## 2021-06-07 MED ORDER — CEFAZOLIN SODIUM-DEXTROSE 2-4 GM/100ML-% IV SOLN
2.0000 g | INTRAVENOUS | Status: AC
  Administered 2021-06-07: 2 g via INTRAVENOUS

## 2021-06-07 MED ORDER — 0.9 % SODIUM CHLORIDE (POUR BTL) OPTIME
TOPICAL | Status: DC | PRN
  Administered 2021-06-07: 1000 mL

## 2021-06-07 MED ORDER — PHENYLEPHRINE HCL-NACL 20-0.9 MG/250ML-% IV SOLN
INTRAVENOUS | Status: DC | PRN
  Administered 2021-06-07: 25 ug/min via INTRAVENOUS

## 2021-06-07 MED ORDER — LACTATED RINGERS IV SOLN: INTRAVENOUS | Status: DC

## 2021-06-07 MED ORDER — POLYETHYLENE GLYCOL 3350 17 G PO PACK: 17.0000 g | PACK | Freq: Every day | ORAL | Status: DC | PRN

## 2021-06-07 MED ORDER — FENTANYL CITRATE (PF) 250 MCG/5ML IJ SOLN
INTRAMUSCULAR | Status: AC
  Filled 2021-06-07: qty 5

## 2021-06-07 MED ORDER — ROCURONIUM BROMIDE 10 MG/ML (PF) SYRINGE
PREFILLED_SYRINGE | INTRAVENOUS | Status: DC | PRN
  Administered 2021-06-07: 20 mg via INTRAVENOUS
  Administered 2021-06-07: 50 mg via INTRAVENOUS

## 2021-06-07 MED ORDER — TRANEXAMIC ACID-NACL 1000-0.7 MG/100ML-% IV SOLN
1000.0000 mg | Freq: Once | INTRAVENOUS | Status: AC
  Administered 2021-06-07: 1000 mg via INTRAVENOUS
  Filled 2021-06-07: qty 100

## 2021-06-07 MED ORDER — PHENYLEPHRINE 40 MCG/ML (10ML) SYRINGE FOR IV PUSH (FOR BLOOD PRESSURE SUPPORT)
PREFILLED_SYRINGE | INTRAVENOUS | Status: DC | PRN
  Administered 2021-06-07 (×2): 80 ug via INTRAVENOUS

## 2021-06-07 MED ORDER — PROPOFOL 10 MG/ML IV BOLUS
INTRAVENOUS | Status: DC | PRN
  Administered 2021-06-07: 90 mg via INTRAVENOUS

## 2021-06-07 MED ORDER — ORAL CARE MOUTH RINSE: 15.0000 mL | Freq: Once | OROMUCOSAL | Status: AC

## 2021-06-07 MED ORDER — ONDANSETRON HCL 4 MG PO TABS: 4.0000 mg | ORAL_TABLET | Freq: Four times a day (QID) | ORAL | Status: DC | PRN

## 2021-06-07 MED ORDER — ACETAMINOPHEN 325 MG PO TABS: 325.0000 mg | ORAL_TABLET | ORAL | Status: DC | PRN

## 2021-06-07 MED ORDER — FENTANYL CITRATE (PF) 100 MCG/2ML IJ SOLN: 25.0000 ug | INTRAMUSCULAR | Status: DC | PRN

## 2021-06-07 MED ORDER — DEXAMETHASONE SODIUM PHOSPHATE 10 MG/ML IJ SOLN
INTRAMUSCULAR | Status: DC | PRN
  Administered 2021-06-07: 10 mg via INTRAVENOUS

## 2021-06-07 MED ORDER — CHLORHEXIDINE GLUCONATE 0.12 % MT SOLN: 15.0000 mL | Freq: Once | OROMUCOSAL | Status: AC

## 2021-06-07 SURGICAL SUPPLY — 52 items
ADH SKN CLS APL DERMABOND .7 (GAUZE/BANDAGES/DRESSINGS) ×1
APL PRP STRL LF DISP 70% ISPRP (MISCELLANEOUS) ×1
BAG COUNTER SPONGE SURGICOUNT (BAG) IMPLANT
BAG SPNG CNTER NS LX DISP (BAG)
BIT DRILL COMPR SURG QC 11 4H (DRILL) IMPLANT
BIT DRILL LONG 4.0 (BIT) IMPLANT
BRUSH SCRUB EZ PLAIN DRY (MISCELLANEOUS) ×4 IMPLANT
CHLORAPREP W/TINT 26 (MISCELLANEOUS) ×2 IMPLANT
COVER PERINEAL POST (MISCELLANEOUS) ×2 IMPLANT
COVER SURGICAL LIGHT HANDLE (MISCELLANEOUS) ×2 IMPLANT
DERMABOND ADVANCED (GAUZE/BANDAGES/DRESSINGS) ×1
DERMABOND ADVANCED .7 DNX12 (GAUZE/BANDAGES/DRESSINGS) ×1 IMPLANT
DRAPE C-ARM 35X43 STRL (DRAPES) ×2 IMPLANT
DRAPE C-ARM 42X72 X-RAY (DRAPES) ×1 IMPLANT
DRAPE IMP U-DRAPE 54X76 (DRAPES) ×4 IMPLANT
DRAPE INCISE IOBAN 66X45 STRL (DRAPES) ×2 IMPLANT
DRAPE STERI IOBAN 125X83 (DRAPES) ×2 IMPLANT
DRAPE SURG 17X23 STRL (DRAPES) ×4 IMPLANT
DRAPE U-SHAPE 47X51 STRL (DRAPES) ×2 IMPLANT
DRESSING MEPILEX FLEX 4X4 (GAUZE/BANDAGES/DRESSINGS) IMPLANT
DRILL BIT LONG 4.0 (BIT) ×2
DRILL COMPR SURG QC 11 4H (DRILL) ×2
DRSG MEPILEX BORDER 4X4 (GAUZE/BANDAGES/DRESSINGS) ×2 IMPLANT
DRSG MEPILEX BORDER 4X8 (GAUZE/BANDAGES/DRESSINGS) ×2 IMPLANT
DRSG MEPILEX FLEX 4X4 (GAUZE/BANDAGES/DRESSINGS) ×2
ELECT REM PT RETURN 9FT ADLT (ELECTROSURGICAL) ×2
ELECTRODE REM PT RTRN 9FT ADLT (ELECTROSURGICAL) ×1 IMPLANT
GLOVE SURG ENC MOIS LTX SZ6.5 (GLOVE) ×6 IMPLANT
GLOVE SURG ENC MOIS LTX SZ7.5 (GLOVE) ×8 IMPLANT
GLOVE SURG UNDER POLY LF SZ6.5 (GLOVE) ×2 IMPLANT
GLOVE SURG UNDER POLY LF SZ7.5 (GLOVE) ×2 IMPLANT
GOWN STRL REUS W/ TWL LRG LVL3 (GOWN DISPOSABLE) ×1 IMPLANT
GOWN STRL REUS W/TWL LRG LVL3 (GOWN DISPOSABLE) ×2
GUIDE PIN 3.2X343 (PIN) ×2
GUIDE PIN 3.2X343MM (PIN) ×4
KIT BASIN OR (CUSTOM PROCEDURE TRAY) ×2 IMPLANT
KIT TURNOVER KIT B (KITS) ×2 IMPLANT
MANIFOLD NEPTUNE II (INSTRUMENTS) ×2 IMPLANT
NAIL INTERTAN 10X18 130D 10S (Nail) ×1 IMPLANT
NS IRRIG 1000ML POUR BTL (IV SOLUTION) ×2 IMPLANT
PACK GENERAL/GYN (CUSTOM PROCEDURE TRAY) ×2 IMPLANT
PAD ARMBOARD 7.5X6 YLW CONV (MISCELLANEOUS) ×4 IMPLANT
PIN GUIDE 3.2X343MM (PIN) IMPLANT
SCREW LAG COMPR KIT 95/90 (Screw) ×1 IMPLANT
SCREW TRIGEN LOW PROF 5.0X37.5 (Screw) ×1 IMPLANT
SUT MNCRL AB 3-0 PS2 18 (SUTURE) ×2 IMPLANT
SUT VIC AB 0 CT1 27 (SUTURE) ×2
SUT VIC AB 0 CT1 27XBRD ANBCTR (SUTURE) IMPLANT
SUT VIC AB 2-0 CT1 27 (SUTURE) ×2
SUT VIC AB 2-0 CT1 TAPERPNT 27 (SUTURE) ×2 IMPLANT
TOWEL GREEN STERILE (TOWEL DISPOSABLE) ×4 IMPLANT
WATER STERILE IRR 1000ML POUR (IV SOLUTION) ×2 IMPLANT

## 2021-06-07 NOTE — Transfer of Care (Signed)
Immediate Anesthesia Transfer of Care Note ? ?Patient: LOGON UTTECH ? ?Procedure(s) Performed: INTRAMEDULLARY (IM) NAIL INTERTROCHANTRIC (Left) ? ?Patient Location: PACU ? ?Anesthesia Type:General ? ?Level of Consciousness: drowsy and responds to stimulation ? ?Airway & Oxygen Therapy: Patient Spontanous Breathing and Patient connected to face mask oxygen ? ?Post-op Assessment: Report given to RN and Post -op Vital signs reviewed and stable ? ?Post vital signs: Reviewed and stable ? ?Last Vitals:  ?Vitals Value Taken Time  ?BP 122/54 06/07/21 1018  ?Temp    ?Pulse 66 06/07/21 1020  ?Resp 16 06/07/21 1020  ?SpO2 100 % 06/07/21 1020  ?Vitals shown include unvalidated device data. ? ?Last Pain:  ?Vitals:  ? 06/07/21 0817  ?TempSrc: Oral  ?PainSc:   ?   ? ?  ? ?Complications: No notable events documented. ?

## 2021-06-07 NOTE — Hospital Course (Addendum)
#  Left Intertrochanteric Fracture of Proximal L Femur S/P Unwitnessed Fall: ?Patient had unwitnessed fall at his memory care unit while on anticoagulation. Orthopedic surgery was consulted in the ED and patient underwent cephalomedullary nailing of the left intertrochanteric fracture on the morning of 3/31. He has no history of stroke, diabetes, heart failure, or MI. RCRI calculated to be class I risk with 3.9% 30-day risk of MI, death, or cardiac arrest. Surgery overall went well and patient is to be weight bearing as tolerated. PT/OT worked with the patient and are recommending *** ? ?#Acute blood loss anemia ?Hb 12.7 about 6 months ago and was 9.6 on the morning of 3/31. Suspect this drop in Hb is in the setting of blood loss after the patient's fall and hip fracture. *** ? ?#Paroxysmal atrial fibrillation/flutter on eliquis ?Patient is on eliquis 5 mg bid and presented after an unwitnessed fall. Holding eliquis in the setting of the patient's surgery. Resumed after *** ? ?#Parkinson's disease ?#Dementia ?Patient diagnosed with Parkinson's in 2014 and currently is on rivastigmine 9.5 mg daily patch, Sinemet 25-100 g, Paxil 30 mg, and mirtazapine 30 mg. 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 At baseline, patient is alert and oriented to self, does not know place or year. All of his home medications were resumed this admission.  ? ?#Orthostatic hypotension ?On midodrine 10 mg tid and florinef 0.1 mg twice daily (does not have any adrenal disease). Continued home meds. ?  ?#Constipation ?Resumed home miralax twice daily.  ?

## 2021-06-07 NOTE — Progress Notes (Signed)
? ?HD#1 ?SUBJECTIVE:  ?Patient Summary: Stephen Gentry is a 87 y.o. with a pertinent PMH of Parkinsons, dementia, hypertension, and atrial flutter/fibrillation on Eliquis, who presented after an unwitnessed fall and admitted for left intertrochanteric fracture of proximal left femur.  ? ?Overnight Events: No acute events overnight ? ?Interim History: This is hospital day 1 for this patient who was seen and evaluated with his wife at the bedside after his surgery. He states that he is tired and does not want to open his eyes. He is not endorsing any pain at this time.  ? ? ?OBJECTIVE:  ?Vital Signs: ?Vitals:  ? 06/06/21 1515 06/06/21 1550 06/06/21 2030 06/07/21 0521  ?BP:  (!) 128/55 (!) 96/56 132/76  ?Pulse:  93 100 95  ?Resp:  17 18 19   ?Temp:  98.7 ?F (37.1 ?C) 100.2 ?F (37.9 ?C) 99.8 ?F (37.7 ?C)  ?TempSrc:  Oral Oral Oral  ?SpO2:  96% 96% 99%  ?Weight: 67.3 kg     ?Height: 6' (1.829 m)     ? ?Supplemental O2: Room Air ?SpO2: 99 % ? ?Filed Weights  ? 06/06/21 1515  ?Weight: 67.3 kg  ? ? ?No intake or output data in the 24 hours ending 06/07/21 0716 ?Net IO Since Admission: No IO data has been entered for this period [06/07/21 0716] ? ?Physical Exam: ?General: Pleasant, tired, elderly-appearing male laying in bed. No acute distress but is not opening eyes (just came out of anesthesia). ?CV: RRR. No murmurs, rubs, or gallops. No LE edema ?Pulmonary: Lungs CTAB. Normal work of breathing. No wheezing or rales. ?Abdominal: Soft, nontender, nondistended. Normal bowel sounds. ?Extremities: Palpable radial and DP pulses. Tenderness to palpation of anterior left femur- surgical sites covered in bandages. ?Skin: Warm and dry.  ?Neuro: Alert and oriented to self. Does not know place or time (baseline). Bilateral pill rolling tremor in upper extremities. Cogwheel rigidity in bilateral upper extremities.  ?Psych: Flat affect  ? ? ? ? ?ASSESSMENT/PLAN:  ?Assessment: ?Principal Problem: ?  Hip fracture ([place]) ? ? ?Plan: ?#Left  intertrochanteric fracture of proximal left femur after an unwitnessed fall ?Patient presented after an unwitnessed fall (on anticoagulation). Ortho was consulted from the ED and patient underwent cephalomedullary nailing of the left intertrochanteric fracture this morning. Eliquis was held in the setting of his surgery today and can likely be resumed tomorrow, as long as Hb is stable. Patient is to be weight bearing as tolerated.  ?- Ortho consulted, appreciate recs ?- Hold Eliquis likely until tomorrow ?- Daily CBC and BMP ?- Tylenol q6h prn for mild pain ?- Percocet 5-325 mg q4h for severe pain ?- PT/OT as able ? ?#Acute blood loss anemia ?Hb 12.7 about 6 months ago and this morning was 9.6. Suspect this drop in Hb is in the setting of blood loss after the patient's fall and hip fracture.  ?- Daily CBC ? ?#Paroxysmal atrial fibrillation/flutter on eliquis ?Patient is on eliquis 5 mg bid and presented after an unwitnessed fall. Holding eliquis in the setting of the patient's surgery today. He is in NSR today.  ? ?#Parkinson's disease ?#Dementia ?Patient diagnosed with Parkinson's in 2014 and currently is on rivastigmine 9.5 mg daily patch, Sinemet 25-100 g, Paxil 30 mg, and mirtazapine 30 mg. 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 At baseline, patient is alert and oriented to self, does not know place or year.  ?- Resumed home meds.  ? ?#  Orthostatic hypotension ?On midodrine 10 mg tid and florinef 0.1 mg twice daily (does not have any adrenal disease). Continued home meds. ? ?#Constipation ?Resumed home miralax twice daily.  ? ?Best Practice: ?Diet: Regular diet  ?IVF: Fluids: none ?VTE: SCDs Start: 06/06/21 1014 ?Code: DNR ?AB: none ?Therapy Recs: Pending ?Family Contact: Wife, [name], at bedside. ?DISPO: Anticipated discharge to  Memory care unit vs SNF  pending Medical stability. ? ?Signature: ?Thaddeus Evitts, D.O.  ?Internal  Medicine Resident, PGY-1 ?[facility]  ?Pager: [phone] ?7:16 AM, 06/07/2021  ? ?Please contact the on call pager after 5 pm and on weekends at [phone]. ? ?

## 2021-06-07 NOTE — Op Note (Signed)
Orthopaedic Surgery Operative Note (CSN: [id number] ) ?Date of Surgery: 06/07/2021  ?Admit Date: 06/06/2021  ? ?Diagnoses: ?Pre-Op Diagnoses: ?Left intertrochanteric femur fracture ? ?Post-Op Diagnosis: ?Same ? ?Procedures: ?CPT 27245-Cephalomedullary nailing of left intertrochanteric fracture ? ?Surgeons : ?Primary: Shona Needles, MD ? ?Assistant: Patrecia Pace, PA-C ? ?Location: OR 3  ? ?Anesthesia: General  ? ?Antibiotics: Ancef 2g preop  ? ?Tourniquet time: None   ? ?Estimated Blood Loss:50 mL ? ?Complications:None  ? ?Specimens:None  ? ?Implants: ?Implant Name Type Inv. Item Serial No. Manufacturer Lot No. LRB No. Used Action  ?NAIL INTERTAN 10X18 130D 10S - [id number] Nail NAIL INTERTAN 10X18 130D 10S  [employer] [id number] Left 1 Implanted  ?SCREW LAG COMPR KIT 95/90 - [id number] Screw SCREW LAG COMPR KIT 95/90  [employer] [id number] Left 1 Implanted  ?SCREW TRIGEN LOW PROF 5.0X37.5 - [id number] Screw SCREW TRIGEN LOW PROF 5.0X37.5  [employer] [id number] Left 1 Implanted  ?  ? ?Indications for Surgery: ?87 year old male with a history of Parkinson's dementia that sustained a ground-level fall and a left intertrochanteric femur fracture.  Due to the unstable nature of his injury I recommended proceeding with cephalomedullary nailing.  Risks and benefits were discussed with the patient's wife.  Risks included but not limited to bleeding, infection, malunion, nonunion, hardware failure, hardware irritation, nerve or blood vessel injury, DVT, even the possibility anesthetic complications.  She agreed to proceed with surgery and consent was obtained. ? ?Operative Findings: ?Cephalomedullary nailing of left intertrochanteric femur fracture using [employer] InterTAN 10 x 180 mm nail with a 95 mm / 90 mm lag screw/compression screw combination ? ?Procedure: ?The patient was identified in the preoperative holding area. Consent was confirmed with the patient and their  family and all questions were answered. The operative extremity was marked after confirmation with the patient. he was then brought back to the operating room by our anesthesia colleagues.  He was placed under general anesthetic.  He was carefully transferred over to a Hana table.  All bony prominences were well-padded.  The left lower extremity was reduced under fluoroscopic guidance using traction.  The left lower extremity was then prepped and draped in usual sterile fashion.  A timeout was performed to verify the patient, the procedure, and the extremity.  Preoperative antibiotics were dosed. ? ?Small incision proximal to the greater trochanter was made and carried down through skin and subcutaneous tissue.  Threaded guidewire was directed at the tip of the greater trochanter into the proximal metaphysis.  Positioning was confirmed with fluoroscopy.  I then used an entry reamer to enter the medullary canal.  I then passed a 10 x 180 mm nail down the center of the canal and seated it until it was appropriately positioned.  I then used the targeting arm to place a threaded guidewire into the head/neck segment.  I confirmed adequate tip apex distance.  I then measured the length and then drilled the path for the compression screw.  I placed an antirotation bar.  I then drilled the path for the lag screw.  The lag screw was placed and then the compression screw was placed to provide approximately 5 mm of compression. ? ?The nail was statically locked.  We then used the targeting arm to place a distal interlocking screw.  The targeting arm was removed.  Final fluoroscopic imaging was obtained.  The incisions were copiously irrigated.  A layered closure of 2-0 Vicryl and 3-0 Monocryl  with Dermabond was used to close the skin.  Sterile dressing was applied.  The patient was awoken from anesthesia and taken to the PACU in stable condition. ? ?Post Op Plan/Instructions: ?The patient will be weightbearing as tolerated to  the left lower extremity.  He will receive postoperative Ancef.  He may be restarted on his Eliquis postoperative day 1 if his hemoglobin is stable.  We will have him mobilize with physical and Occupational Therapy. ? ?I was present and performed the entire surgery. ? ?Patrecia Pace, PA-C did assist me throughout the case. An assistant was necessary given the difficulty in approach, maintenance of reduction and ability to instrument the fracture. ? ? ?Katha Hamming, MD ?[facility]  ?

## 2021-06-07 NOTE — Anesthesia Procedure Notes (Signed)
Procedure Name: Intubation ?Date/Time: 06/07/2021 9:09 AM ?Performed by: Carolan Clines, CRNA ?Pre-anesthesia Checklist: Patient identified, Emergency Drugs available, Suction available and Patient being monitored ?Patient Re-evaluated:Patient Re-evaluated prior to induction ?Oxygen Delivery Method: Circle System Utilized ?Preoxygenation: Pre-oxygenation with 100% oxygen ?Induction Type: IV induction ?Ventilation: Mask ventilation without difficulty and Oral airway inserted - appropriate to patient size ?Laryngoscope Size: Mac and 4 ?Grade View: Grade II ?Tube type: Oral ?Tube size: 7.5 mm ?Number of attempts: 1 ?Airway Equipment and Method: Stylet and Oral airway ?Placement Confirmation: ETT inserted through vocal cords under direct vision, positive ETCO2 and breath sounds checked- equal and bilateral ?Secured at: 26 cm ?Tube secured with: Tape ?Dental Injury: Teeth and Oropharynx as per pre-operative assessment  ? ? ? ? ?

## 2021-06-07 NOTE — Progress Notes (Signed)
PT Cancellation Note ? ?Patient Details ?Name: Stephen Gentry ?MRN: [id number] ?DOB: 30-Sep-1934 ? ? ?Cancelled Treatment:    Reason Eval/Treat Not Completed: Patient declined, no reason specified. Pt requires re-orientation to place and situation, declines mobility at this time, preferring to start tomorrow. ? ? ?Arlyss Gandy ?06/07/2021, 4:26 PM ?

## 2021-06-07 NOTE — TOC CAGE-AID Note (Signed)
Transition of Care (TOC) - CAGE-AID Screening ? ? ?Patient Details  ?Name: Stephen Gentry ?MRN: [id number] ?Date of Birth: 1935-01-21 ? ?Transition of Care (TOC) CM/SW Contact:    ?Vaishnavi Dalby C Tarpley-Carter, LCSWA ?Phone Number: ?06/07/2021, 2:51 PM ? ? ?Clinical Narrative: ?Pt is unable to participate in Cage Aid.  Pt is experiencing dementia. ? ?Passenger transport manager, MSW, LCSW-A ?Pronouns:  She/Her/Hers ?[facility] ?Clinical Social Worker ?Direct Number:  [phone] ?[email or web address]  ? ?CAGE-AID Screening: ?Substance Abuse Screening unable to be completed due to: : Patient unable to participate ? ?  ?  ?  ?  ?  ? ?Substance Abuse Education Offered: No ? ?  ? ? ? ? ? ? ?

## 2021-06-07 NOTE — Interval H&P Note (Signed)
History and Physical Interval Note: ? ?06/07/2021 ?8:25 AM ? ?Stephen Gentry  has presented today for surgery, with the diagnosis of left hip fracture.  The various methods of treatment have been discussed with the patient and family. After consideration of risks, benefits and other options for treatment, the patient has consented to  Procedure(s): ?INTRAMEDULLARY (IM) NAIL INTERTROCHANTRIC (Left) as a surgical intervention.  The patient's history has been reviewed, patient examined, no change in status, stable for surgery.  I have reviewed the patient's chart and labs.  Questions were answered to the patient's satisfaction.   ? ? ?Caryn Bee P Fernanda Twaddell ? ? ?

## 2021-06-08 DIAGNOSIS — I480 Paroxysmal atrial fibrillation: Secondary | ICD-10-CM

## 2021-06-08 DIAGNOSIS — G20 Parkinson's disease: Secondary | ICD-10-CM | POA: Diagnosis not present

## 2021-06-08 DIAGNOSIS — S72142D Displaced intertrochanteric fracture of left femur, subsequent encounter for closed fracture with routine healing: Secondary | ICD-10-CM | POA: Diagnosis not present

## 2021-06-08 DIAGNOSIS — D62 Acute posthemorrhagic anemia: Secondary | ICD-10-CM | POA: Diagnosis not present

## 2021-06-08 DIAGNOSIS — F02A0 Dementia in other diseases classified elsewhere, mild, without behavioral disturbance, psychotic disturbance, mood disturbance, and anxiety: Secondary | ICD-10-CM

## 2021-06-08 LAB — CBC
HCT: 25.8 % — ABNORMAL LOW (ref 39.0–52.0)
Hemoglobin: 8.3 g/dL — ABNORMAL LOW (ref 13.0–17.0)
MCH: 29.2 pg (ref 26.0–34.0)
MCHC: 32.2 g/dL (ref 30.0–36.0)
MCV: 90.8 fL (ref 80.0–100.0)
Platelets: 167 10*3/uL (ref 150–400)
RBC: 2.84 MIL/uL — ABNORMAL LOW (ref 4.22–5.81)
RDW: 13.2 % (ref 11.5–15.5)
WBC: 6.8 10*3/uL (ref 4.0–10.5)
nRBC: 0 % (ref 0.0–0.2)

## 2021-06-08 LAB — BASIC METABOLIC PANEL
Anion gap: 7 (ref 5–15)
BUN: 31 mg/dL — ABNORMAL HIGH (ref 8–23)
CO2: 28 mmol/L (ref 22–32)
Calcium: 8.7 mg/dL — ABNORMAL LOW (ref 8.9–10.3)
Chloride: 104 mmol/L (ref 98–111)
Creatinine, Ser: 1.06 mg/dL (ref 0.61–1.24)
GFR, Estimated: >60 mL/min (ref >60)
Glucose, Bld: 129 mg/dL — ABNORMAL HIGH (ref 70–99)
Potassium: 3.3 mmol/L — ABNORMAL LOW (ref 3.5–5.1)
Sodium: 139 mmol/L (ref 135–145)

## 2021-06-08 MED ORDER — APIXABAN 5 MG PO TABS
5.0000 mg | ORAL_TABLET | Freq: Two times a day (BID) | ORAL | Status: DC
Start: 2021-06-08 — End: 2021-06-12
  Administered 2021-06-08 – 2021-06-12 (×9): 5 mg via ORAL
  Filled 2021-06-08 (×10): qty 1

## 2021-06-08 MED ORDER — POTASSIUM CHLORIDE CRYS ER 20 MEQ PO TBCR
30.0000 meq | EXTENDED_RELEASE_TABLET | Freq: Two times a day (BID) | ORAL | Status: AC
  Administered 2021-06-08 (×2): 30 meq via ORAL
  Filled 2021-06-08 (×2): qty 1

## 2021-06-08 NOTE — Progress Notes (Signed)
ANTICOAGULATION CONSULT NOTE - Initial Consult ? ?Pharmacy Consult for Apixaban ?Indication: atrial fibrillation ? ?Allergies  ?Allergen Reactions  ? Lorazepam Other (See Comments)  ?  Combative, foul language. ?Wife doesn't want patient to have again  ? Tetracyclines & Related Anaphylaxis  ? Donepezil Other (See Comments)  ?  Didn't like  ? Amitiza [Lubiprostone] Nausea Only  ? ? ?Patient Measurements: ?Height: 6' (182.9 cm) ?Weight: 67.3 kg (148 lb 5.9 oz) ?IBW/kg (Calculated) : 77.6 ? ?Vital Signs: ?Temp: 98.2 ?F (36.8 ?C) (04/01 0750) ?Temp Source: Oral (04/01 0750) ?BP: 115/62 (04/01 0750) ?Pulse Rate: 54 (04/01 0750) ? ?Labs: ?Recent Labs  ?  06/06/21 ?0733 06/07/21 ?0307 06/08/21 ?0242  ?HGB 12.6* 9.6* 8.3*  ?HCT 37.6* 28.7* 25.8*  ?PLT 160 147* 167  ?LABPROT 14.2  --   --   ?INR 1.1  --   --   ?CREATININE 0.74 0.94 1.06  ? ? ?Estimated Creatinine Clearance: 48.5 mL/min (by C-G formula based on SCr of 1.06 mg/dL). ? ? ?Medical History: ?Past Medical History:  ?Diagnosis Date  ? Alzheimer's dementia (HCC)   ? Anemia   ? Anticoagulant long-term use   ? xarelto  ? Anxiety   ? Arthritis   ? Autonomic dysfunction   ? Baker's cyst   ? right knee  ? Chronic constipation   ? Diverticulosis   ? Internal hemorrhoids   ? Nocturia   ? Numbness and tingling in right hand   ? since tablesaw injury 2002  ? Numbness and tingling of both feet   ? toes  ? Orthostatic hypotension   ? PAF (paroxysmal atrial fibrillation) (HCC)   ? cardiologist-- dr Graciela Husbands  ? Parkinson's disease (HCC)   ? neurologist-- dr tat  (idiopathic,  notes in epic)  ? Tremor, essential   ? Wears glasses   ? ? ?Assessment: ?56 YOM presenting from memory care unit for fall resulting in hip fracture resulting in need for surgical repair. Patient was on apixaban 5 mg BID for atrial fibrillation/atrial flutter PTA which was held prior to surgery. Patient underwent hip fracture repair 3/31 and pharmacy was consulted to restart PTA apixaban if n signs of bleeding  were noted and Hgb>8.  ? ?No signs of bleeding noted by RN. Hgb 9.6>8.3 post-op, PLT stable/WNL.  ? ?Goal of Therapy:  ?Monitor platelets by anticoagulation protocol: Yes ?  ?Plan:  ?START apixaban 5 mg PO BID  ?Monitor daily CBC, signs/symptoms of bleeding  ? ?Jani Gravel, PharmD ?PGY-1 Acute Care Resident  ?06/08/2021 9:08 AM  ? ? ? ?

## 2021-06-08 NOTE — Progress Notes (Signed)
? ?                              [facility] Progress Note ? ?Patient ID: ?Stephen Gentry ?MRN: [id number] ?DOB/AGE: April 05, 1934 87 y.o. ? ?Subjective: ? ?Reports pain in left hip which is expected  ?Ortho issues stable  ? ? ?ROS ?As above ? ?Objective:  ? ?VITALS:   ?Vitals:  ? 06/07/21 1700 06/07/21 1959 06/08/21 0434 06/08/21 0750  ?BP: 125/88 111/64 118/72 115/62  ?Pulse:  80  (!) 54  ?Resp: 17 16  18   ?Temp:  98.2 ?F (36.8 ?C)  98.2 ?F (36.8 ?C)  ?TempSrc:  Oral  Oral  ?SpO2: 100%   92%  ?Weight:      ?Height:      ? ? ?Estimated body mass index is 20.12 kg/m? as calculated from the following: ?  Height as of this encounter: 6' (1.829 m). ?  Weight as of this encounter: 67.3 kg. ? ? ?Intake/Output   ?   03/31 0701 ?04/01 0700 04/01 0701 ?04/02 0700  ? I.V. (mL/kg) 600 (8.9)   ? Total Intake(mL/kg) 600 (8.9)   ? Urine (mL/kg/hr) 500 (0.3)   ? Blood 50   ? Total Output 550   ? Net +50   ?     ? Urine Occurrence 1 x   ? Stool Occurrence 1 x   ?  ? ?LABS ? ?Results for orders placed or performed during the hospital encounter of 06/06/21 (from the past 24 hour(s))  ?VITAMIN D 25 Hydroxy (Vit-D Deficiency, Fractures)     Status: None  ? Collection Time: 06/07/21  2:54 PM  ?Result Value Ref Range  ? Vit D, 25-Hydroxy 53.24 30 - 100 ng/mL  ?Basic metabolic panel     Status: Abnormal  ? Collection Time: 06/08/21  2:42 AM  ?Result Value Ref Range  ? Sodium 139 135 - 145 mmol/L  ? Potassium 3.3 (L) 3.5 - 5.1 mmol/L  ? Chloride 104 98 - 111 mmol/L  ? CO2 28 22 - 32 mmol/L  ? Glucose, Bld 129 (H) 70 - 99 mg/dL  ? BUN 31 (H) 8 - 23 mg/dL  ? Creatinine, Ser 1.06 0.61 - 1.24 mg/dL  ? Calcium 8.7 (L) 8.9 - 10.3 mg/dL  ? GFR, Estimated >60 >60 mL/min  ? Anion gap 7 5 - 15  ?CBC     Status: Abnormal  ? Collection Time: 06/08/21  2:42 AM  ?Result Value Ref Range  ? WBC 6.8 4.0 - 10.5 K/uL  ? RBC 2.84 (L) 4.22 - 5.81 MIL/uL  ? Hemoglobin 8.3 (L) 13.0 - 17.0 g/dL  ? HCT 25.8 (L)  39.0 - 52.0 %  ? MCV 90.8 80.0 - 100.0 fL  ? MCH 29.2 26.0 - 34.0 pg  ? MCHC 32.2 30.0 - 36.0 g/dL  ? RDW 13.2 11.5 - 15.5 %  ? Platelets 167 150 - 400 K/uL  ? nRBC 0.0 0.0 - 0.2 %  ? ? ? ?PHYSICAL EXAM:  ? ?Gen: awake, NAD ?Ext:  ?     Left Lower Extremity  ? Dressings L hip stable ? Ext warm  ? Swelling minimal ? Motor and sensory functions intact ? No DCT  ?  ? ?Assessment/Plan: ?1 Day Post-Op  ? ? ? ? ?Anti-infectives (From admission, onward)  ? ? Start     Dose/Rate Route Frequency Ordered Stop  ? 06/07/21 1800  ceFAZolin (  ANCEF) IVPB 2g/100 mL premix       ? 2 g ?200 mL/hr over 30 Minutes Intravenous Every 8 hours 06/07/21 1237 06/08/21 1023  ? 06/07/21 0900  ceFAZolin (ANCEF) IVPB 2g/100 mL premix       ? 2 g ?200 mL/hr over 30 Minutes Intravenous On call to O.R. 06/07/21 [id number] 06/07/21 0915  ? 06/07/21 0808  ceFAZolin (ANCEF) 2-4 GM/100ML-% IVPB       ?Note to Pharmacy: Gregery Na H: cabinet override  ?    06/07/21 [id number] 06/07/21 0920  ? ?  ?. ? ?POD/HD#: 70 ? ?87 year old male ground-level fall with left intertrochanteric hip fracture ? ?-Fall ? ?-Left intertrochanteric hip fracture treated with intramedullary nailing ? Weight-bear as tolerated left leg ? Range of motion as tolerated left hip and knee ? Therapy evaluations ? Ice as needed for swelling and pain control ? Dressing changes as needed to left hip starting tomorrow ? ?- Pain management: ? Multimodal ? Minimize narcotics ?- ABL anemia/Hemodynamics ? Monitor ? Expected blood loss for injury and surgery ? ?- Medical issues  ? Per primary ? ?- DVT/PE prophylaxis: ? Resume home Eliquis ? I discussed with the pharmacist this morning ? ?- ID:  ? Periop antibiotics ? ?- Metabolic Bone Disease: ? Fracture is a fragility fracture despite normal vitamin D levels ? Recommend DEXA in the next 4 to 8 weeks ? Would likely benefit from outpatient referral to osteoporosis specialist ? ?- Activity: ? Weight-bear as tolerated with assistance on left leg ? ?-  Impediments to fracture healing: ? Low energy hip fracture suggestive of poor bone quality ? Parkinsons (multifactorial) ? ?- Dispo: ? Therapy evals ? TOC consult ? ? ? ? ?Jari Pigg, PA-C ?[phone] (C) ?06/08/2021, 11:36 AM ? ?[facility] ?[place] [address] ?[phone] Jenetta Downer) ?[phone] (F) ? ? ? ?After 5pm and on the weekends please log on to Amion, go to orthopaedics and the look under the [facility] Call for the provider(s) on call. You can also call our office at [phone] and then follow the prompts to be connected to the call team.  ? Patient ID: Stephen Gentry, male   DOB: 22-Jul-1934, 87 y.o.   MRN: [id number] ? ?

## 2021-06-08 NOTE — Evaluation (Signed)
Physical Therapy Evaluation ?Patient Details ?Name: Stephen Gentry ?MRN: [id number] ?DOB: 01/25/1935 ?Today's Date: 06/08/2021 ? ?History of Present Illness ? Pt admitted after a fall at a memory care unit, Sustained left hip fracture and underwent IM nail on 06/07/21.  PMH significant for but not limited to: Parkinsons, dementia, falls, anemia, PAF, orthostatic hypotension  ?Clinical Impression ? Patient is s/p above surgery resulting in functional limitations due to the deficits listed below (see PT Problem List).  Patient will benefit from skilled PT to increase their independence and safety with mobility to allow discharge to the venue listed below.  DC plan will likely depend on LOC that memory unit can provide.  Pt is high risk for future falls if ambulating without assistance.  ?   ? ?Recommendations for follow up therapy are one component of a multi-disciplinary discharge planning process, led by the attending physician.  Recommendations may be updated based on patient status, additional functional criteria and insurance authorization. ? ?Follow Up Recommendations Skilled nursing-short term rehab (<3 hours/day) (or return to Memory care unit if they can provide needed assistance) ? ?  ?Assistance Recommended at Discharge Frequent or constant Supervision/Assistance  ?Patient can return home with the following ? A lot of help with walking and/or transfers;A lot of help with bathing/dressing/bathroom ? ?  ?Equipment Recommendations None recommended by PT (Possibly WC)  ?Recommendations for Other Services ?    ?  ?Functional Status Assessment Patient has had a recent decline in their functional status and demonstrates the ability to make significant improvements in function in a reasonable and predictable amount of time.  ? ?  ?Precautions / Restrictions Precautions ?Precautions: Fall ?Restrictions ?Weight Bearing Restrictions: Yes ?LLE Weight Bearing: Weight bearing as tolerated  ? ?  ? ?Mobility ? Bed Mobility ?Overal  bed mobility: Needs Assistance ?Bed Mobility: Supine to Sit, Sit to Supine ?  ?  ?Supine to sit: Max assist, +2 for physical assistance ?Sit to supine: Max assist, +2 for physical assistance ?  ?General bed mobility comments: Total assist to scoot up in bed ?  ? ?Transfers ?Overall transfer level: Needs assistance ?Equipment used:  Psychiatric nurse) ?Transfers: Sit to/from Stand ?Sit to Stand: Mod assist, +2 physical assistance, From elevated surface ?BP 87/44 taken in sitting after standing the first time.  Pt with c/o dizziness in standing.  After rest break stood a second time.   ?  ?  ?  ?  ?  ?General transfer comment: Pt able to pull on Stedy and use legs to assist with standing.  Performed twice ?  ? ?Ambulation/Gait ?Ambulation/Gait assistance:  (Unable at this time) ?  ?  ?  ?  ?  ?  ?  ? ?Stairs ?  ?  ?  ?  ?  ? ?Wheelchair Mobility ?  ? ?Modified Rankin (Stroke Patients Only) ?  ? ?  ? ?Balance Overall balance assessment: Needs assistance, History of Falls ?Sitting-balance support: Feet supported ?Sitting balance-Leahy Scale: Poor ?Sitting balance - Comments: At times pt could sit unsupported but at other times required up to mod A ?Postural control: Posterior lean ?  ?  ?  ?  ?  ?  ?  ?  ?  ?  ?  ?  ?  ?  ?   ? ? ? ?Pertinent Vitals/Pain Pain Assessment ?Pain Assessment: 0-10 ?Pain Score:  (Pt didn't rate, but did c/o some discmfort with mobiity, but did not appear to be more than mild pain based  on facial expressions and verbalizations) ?Pain Location: left hip and abdomen ?Pain Intervention(s): Limited activity within patient's tolerance, Monitored during session  ? ? ?Home Living Family/patient expects to be discharged to:: Unsure ?  ?  ?  ?  ?  ?  ?  ?  ?  ?Additional Comments: Return to memory care vs SNF  ?  ?Prior Function Prior Level of Function : Needs assist ? Cognitive Assist : Mobility (cognitive);ADLs (cognitive) ?Mobility (Cognitive): Set up cues ?ADLs (Cognitive): Intermittent  cues ?Physical Assist : Mobility (physical);ADLs (physical) ?Mobility (physical): Bed mobility;Transfers;Gait ?ADLs (physical): Feeding;Grooming;Bathing;Dressing;Toileting;IADLs ?Mobility Comments: used a rollator and lift chair.  H/o falls with and increasing frequency recently ?  ?  ? ? ?Hand Dominance  ?   ? ?  ?Extremity/Trunk Assessment  ? Upper Extremity Assessment ?Upper Extremity Assessment: Defer to OT evaluation ?  ? ?Lower Extremity Assessment ?Lower Extremity Assessment: LLE deficits/detail ?LLE Deficits / Details: Decresed strength and AROM due to pain form recent hip surgery ?  ? ?Cervical / Trunk Assessment ?Cervical / Trunk Assessment: Kyphotic  ?Communication  ? Communication: No difficulties  ?Cognition Arousal/Alertness: Lethargic ?Behavior During Therapy: Flat affect ?Overall Cognitive Status: History of cognitive impairments - at baseline ?  ?  ?  ?  ?  ?  ?  ?  ?  ?  ?  ?  ?  ?  ?  ?  ?  ?  ?  ? ?  ?General Comments General comments (skin integrity, edema, etc.): Wife [name] and son Ronalee Belts present and interactive throughout session. ? ?  ?Exercises    ? ?Assessment/Plan  ?  ?PT Assessment Patient needs continued PT services  ?PT Problem List Decreased strength;Decreased range of motion;Decreased activity tolerance;Decreased balance;Decreased mobility;Decreased coordination;Decreased knowledge of use of DME ? ?   ?  ?PT Treatment Interventions DME instruction;Gait training;Therapeutic exercise;Balance training;Patient/family education;Neuromuscular re-education   ? ?PT Goals (Current goals can be found in the Care Plan section)  ?Acute Rehab PT Goals ?Patient Stated Goal: Pt unable.  Wife unsure of goals at this time ?PT Goal Formulation: With family ?Time For Goal Achievement: 06/22/21 ?Potential to Achieve Goals: Good ? ?  ?Frequency Min 3X/week ?  ? ? ?Co-evaluation   ?  ?  ?  ?  ? ? ?  ?AM-PAC PT "6 Clicks" Mobility  ?Outcome Measure Help needed turning from your back to your side while in a flat  bed without using bedrails?: Total ?Help needed moving from lying on your back to sitting on the side of a flat bed without using bedrails?: Total ?Help needed moving to and from a bed to a chair (including a wheelchair)?: Total ?Help needed standing up from a chair using your arms (e.g., wheelchair or bedside chair)?: Total ?Help needed to walk in hospital room?: Total ?Help needed climbing 3-5 steps with a railing? : Total ?6 Click Score: 6 ? ?  ?End of Session Equipment Utilized During Treatment: Gait belt;Other (comment) Charlaine Dalton) ?Activity Tolerance: Patient tolerated treatment well ?Patient left: in bed;with call bell/phone within reach;with nursing/sitter in room;with family/visitor present ?Nurse Communication: Mobility status ?PT Visit Diagnosis: Repeated falls (R29.6) ?  ? ?Time: [id number] ?PT Time Calculation (min) (ACUTE ONLY): 41 min ? ? ?Charges:   PT Evaluation ?$PT Eval Moderate Complexity: 1 Mod ?PT Treatments ?$Gait Training: 23-37 mins ?  ?   ? ? ?Lavonia Dana, PT   ?Acute Rehabilitation Services  ?Pager [phone] ?Office [phone] ?06/08/2021 ? ? ?Lavonia Dana J ?06/08/2021, 1:29  PM ? ?

## 2021-06-08 NOTE — Evaluation (Signed)
Occupational Therapy Evaluation ?Patient Details ?Name: Stephen ProwsCarl F Gentry ?MRN: [id number] ?DOB: 10/06/1934 ?Today's Date: 06/08/2021 ? ? ?History of Present Illness Pt admitted after a fall at a memory care unit, Sustained left hip fracture and underwent IM nail on 06/07/21.  PMH significant for but not limited to: Parkinsons, dementia, falls, anemia, PAF, orthostatic hypotension  ? ?Clinical Impression ?  ?Pt admitted for procedure and concerns listed above. PTA pt's family reports that he was ambulating with a rollator and experiencing multiple falls recently. Daughter also reported that she assists pt with all bathing needs and dressing and toileting when needed. At this time, pt is limited by weakness, pain, balance deficits, cognitive decline, and fear of falling. He requires max A +2 for any OOB mobility and mod-max A for bed mobility. Family was extensively educated on therapeutic goals and potential ways to progress pt given his comorbidities. Education was also provided on positioning, encouraging movement, sitting pt up for all meals and when drinking. Recommending SNF level services or Return to memory care if they are able to provide increased support and assist as well as HH therapies. OT will continue to follow acutely.    ?   ? ?Recommendations for follow up therapy are one component of a multi-disciplinary discharge planning process, led by the attending physician.  Recommendations may be updated based on patient status, additional functional criteria and insurance authorization.  ? ?Follow Up Recommendations ? Skilled nursing-short term rehab (<3 hours/day) (Or return to ALF if they can provide adequate assist)  ?  ?Assistance Recommended at Discharge Frequent or constant Supervision/Assistance  ?Patient can return home with the following Two people to help with walking and/or transfers;Two people to help with bathing/dressing/bathroom;Assistance with feeding ? ?  ?Functional Status Assessment ? Patient has  had a recent decline in their functional status and demonstrates the ability to make significant improvements in function in a reasonable and predictable amount of time.  ?Equipment Recommendations ? Wheelchair (measurements OT);Wheelchair cushion (measurements OT)  ?  ?Recommendations for Other Services   ? ? ?  ?Precautions / Restrictions Precautions ?Precautions: Fall ?Restrictions ?Weight Bearing Restrictions: Yes ?LLE Weight Bearing: Weight bearing as tolerated  ? ?  ? ?Mobility Bed Mobility ?Overal bed mobility: Needs Assistance ?Bed Mobility: Supine to Sit, Sit to Supine ?  ?  ?Supine to sit: Max assist, +2 for physical assistance ?Sit to supine: Max assist, +2 for physical assistance ?  ?General bed mobility comments: Total assist to scoot up in bed ?  ? ?Transfers ?  ?  ?  ?  ?  ?  ?  ?  ?  ?General transfer comment: Deferred ?  ? ?  ?Balance Overall balance assessment: Needs assistance, History of Falls ?Sitting-balance support: Feet supported ?Sitting balance-Leahy Scale: Poor ?Sitting balance - Comments: At times pt could sit unsupported but at other times required up to mod A ?Postural control: Posterior lean ?  ?  ?  ?  ?  ?  ?  ?  ?  ?  ?  ?  ?  ?  ?   ? ?ADL either performed or assessed with clinical judgement  ? ?ADL Overall ADL's : Needs assistance/impaired ?  ?  ?  ?  ?  ?  ?  ?  ?  ?  ?  ?  ?  ?  ?  ?  ?  ?  ?  ?General ADL Comments: Pt requiring max-total assist +1-2 at this time due to cogntive  decline, weakness, pain, fear of falling, and balance deficits.  ? ? ? ?Vision Baseline Vision/History: 1 Wears glasses ?Ability to See in Adequate Light: 1 Impaired ?Patient Visual Report: No change from baseline ?Vision Assessment?: No apparent visual deficits  ?   ?Perception   ?  ?Praxis   ?  ? ?Pertinent Vitals/Pain Pain Assessment ?Pain Assessment: No/denies pain ?Pain Intervention(s): Monitored during session  ? ? ? ?Hand Dominance Right ?  ?Extremity/Trunk Assessment Upper Extremity  Assessment ?Upper Extremity Assessment: Generalized weakness ?  ?Lower Extremity Assessment ?Lower Extremity Assessment: LLE deficits/detail ?LLE Deficits / Details: Decresed strength and AROM due to pain form recent hip surgery ?  ?Cervical / Trunk Assessment ?Cervical / Trunk Assessment: Kyphotic ?  ?Communication Communication ?Communication: No difficulties ?  ?Cognition Arousal/Alertness: Lethargic ?Behavior During Therapy: Flat affect ?Overall Cognitive Status: History of cognitive impairments - at baseline ?  ?  ?  ?  ?  ?  ?  ?  ?  ?  ?  ?  ?  ?  ?  ?  ?General Comments: Pt has advanced Parkinson's and dementia ?  ?  ?General Comments  Monitor alarming throughout session reporting missed beats and asystole - RN aware ? ?  ?Exercises   ?  ?Shoulder Instructions    ? ? ?Home Living Family/patient expects to be discharged to:: Unsure ?  ?  ?  ?  ?  ?  ?  ?  ?  ?  ?  ?  ?  ?  ?  ?  ?Additional Comments: Return to memory care vs SNF ?  ? ?  ?Prior Functioning/Environment Prior Level of Function : Needs assist ? Cognitive Assist : Mobility (cognitive);ADLs (cognitive) ?Mobility (Cognitive): Set up cues ?ADLs (Cognitive): Intermittent cues ?Physical Assist : Mobility (physical);ADLs (physical) ?Mobility (physical): Bed mobility;Transfers;Gait ?ADLs (physical): Feeding;Grooming;Bathing;Dressing;Toileting;IADLs ?Mobility Comments: used a rollator and lift chair.  H/o falls with and increasing frequency recently ?ADLs Comments: Daughter assists with bathing, memory care assists with all meals, medications, toileting, and dressing ?  ? ?  ?  ?OT Problem List: Decreased strength;Decreased range of motion;Decreased activity tolerance;Impaired balance (sitting and/or standing);Decreased safety awareness;Decreased knowledge of use of DME or AE;Decreased cognition;Pain ?  ?   ?OT Treatment/Interventions: Self-care/ADL training;Therapeutic exercise;Energy conservation;DME and/or AE instruction;Therapeutic  activities;Cognitive remediation/compensation;Patient/family education;Balance training  ?  ?OT Goals(Current goals can be found in the care plan section) Acute Rehab OT Goals ?Patient Stated Goal: per daughter, to be able to transfer to [facility] ?OT Goal Formulation: With family ?Time For Goal Achievement: 06/22/21 ?Potential to Achieve Goals: Fair ?ADL Goals ?Pt Will Perform Eating: with set-up;sitting ?Pt Will Perform Grooming: with set-up;sitting ?Pt Will Perform Upper Body Bathing: with min assist;sitting ?Pt Will Perform Lower Body Bathing: with mod assist;sitting/lateral leans ?Pt Will Perform Upper Body Dressing: with min assist;sitting ?Pt Will Perform Lower Body Dressing: with mod assist;sit to/from stand;sitting/lateral leans ?Pt Will Transfer to Toilet: with mod assist;stand pivot transfer ?Pt Will Perform Toileting - Clothing Manipulation and hygiene: with mod assist;sitting/lateral leans  ?OT Frequency: Min 2X/week ?  ? ?Co-evaluation   ?  ?  ?  ?  ? ?  ?AM-PAC OT "6 Clicks" Daily Activity     ?Outcome Measure Help from another person eating meals?: A Lot ?Help from another person taking care of personal grooming?: A Lot ?Help from another person toileting, which includes using toliet, bedpan, or urinal?: Total ?Help from another person bathing (including washing, rinsing, drying)?: Total ?Help from another  person to put on and taking off regular upper body clothing?: A Lot ?Help from another person to put on and taking off regular lower body clothing?: Total ?6 Click Score: 9 ?  ?End of Session Nurse Communication: Mobility status ? ?Activity Tolerance: Patient limited by pain;Patient limited by fatigue ?Patient left: in bed;with call bell/phone within reach;with bed alarm set;with family/visitor present ? ?OT Visit Diagnosis: Unsteadiness on feet (R26.81);Other abnormalities of gait and mobility (R26.89);Muscle weakness (generalized) (M62.81);Pain ?Pain - Right/Left: Left ?Pain - part of body: Hip  ?               ?Time: 3532-9924 ?OT Time Calculation (min): 98 min ?Charges:  OT General Charges ?$OT Visit: 1 Visit ?OT Evaluation ?$OT Eval Moderate Complexity: 1 Mod ?OT Treatments ?$Self Care/Home Management : 38-52 mins ?$Therapeutic A

## 2021-06-08 NOTE — Progress Notes (Signed)
? ?HD#1 ?SUBJECTIVE:  ?Patient Summary: Stephen Gentry is a 87 y.o. with a pertinent PMH of Parkinsons, dementia, hypertension, and atrial flutter/fibrillation on Eliquis, who presented after an unwitnessed fall and admitted for left intertrochanteric fracture of proximal left femur.  ? ?Overnight Events: No acute events overnight ? ?Interim History: Patient seen and evaluated at bedside this AM.  Wife and son were also present.  Dr. Shelva Majestic states that he is not doing as well is he would like to be.  He states that he does have continued pain in the left hip area that is well-tolerated with pain medications.  Currently a 4 out of 10 with pain meds, but 6-7 out of 10 when he does not have medications on board. His pain is tolerable at this time.  We discussed working with PT today which he is amenable to. ? ? ?OBJECTIVE:  ?Vital Signs: ?Vitals:  ? 06/07/21 1115 06/07/21 1700 06/07/21 1959 06/08/21 0434  ?BP: (!) 130/58 125/88 111/64 118/72  ?Pulse: 76  80   ?Resp: 14 17 16    ?Temp: 97.9 ?F (36.6 ?C)  98.2 ?F (36.8 ?C)   ?TempSrc:   Oral   ?SpO2: 100% 100%    ?Weight:      ?Height:      ? ?Supplemental O2: Room Air ?SpO2: 100 % ?O2 Flow Rate (L/min): 7 L/min ? ?Filed Weights  ? 06/06/21 1515  ?Weight: 67.3 kg  ? ? ? ?Intake/Output Summary (Last 24 hours) at 06/08/2021 0641 ?Last data filed at 06/07/2021 1700 ?Gross per 24 hour  ?Intake 600 ml  ?Output 550 ml  ?Net 50 ml  ? ?Net IO Since Admission: 50 mL [06/08/21 0641] ? ?Physical Exam: ?Physical Exam ?Constitutional:   ?   Comments: Chronically ill-appearing, answers questions appropriately, NAD  ?Pulmonary:  ?   Effort: Pulmonary effort is normal. No respiratory distress.  ?Abdominal:  ?   General: Bowel sounds are normal.  ?   Tenderness: There is no abdominal tenderness. There is no guarding.  ?Musculoskeletal:  ?   Right lower leg: No edema.  ?   Left lower leg: No edema.  ?   Comments: Tenderness to palpation over the anterior lateral left hip.  ?Skin: ?   General:  Skin is warm and dry.  ?   Comments: Surgical sites covered with clean bandages.  No drainage, purulence, or erythema noted.  ?Neurological:  ?   Comments: Bilateral pill-rolling in the upper extremities bilaterally  ?Psychiatric:  ?   Comments: Flat affect  ?  ? ? ? ? ?ASSESSMENT/PLAN:  ?Assessment: ?Principal Problem: ?  Hip fracture (HCC) ?Active Problems: ?  Closed intertrochanteric fracture of hip, left, initial encounter (HCC) ? ? ?Plan: ?#Left intertrochanteric fracture of proximal left femur after an unwitnessed fall ?Patient presented after an unwitnessed fall (on anticoagulation). Ortho was consulted from the ED and patient underwent cephalomedullary nailing of the left intertrochanteric fracture this morning. Patient is to be weight bearing as tolerated.  ?- Ortho consulted, appreciate recs ?- Daily CBC and BMP ?- Tylenol q6h prn for mild pain ?- Percocet 5-325 mg q4h for severe pain ?- PT/OT as able ? ?#Acute blood loss anemia ?Hb 12.7 about 6 months ago 9.6, yesterday, 8.3 today. Suspect this drop in Hb is in the setting of blood loss after the patient's fall and hip fracture.  ?- Daily CBC ? ?#Paroxysmal atrial fibrillation/flutter on eliquis ?Patient is on eliquis 5 mg bid and presented after an unwitnessed fall. Holding eliquis  in the setting of the patient's surgery today. He is in NSR today.  Given his recent fall, continued discussion about restarting his Eliquis should be continued as adverse effects may outweigh benefit at this point time. ? ?#Parkinson's disease ?#Dementia ?Patient diagnosed with Parkinson's in 2014 and currently is on rivastigmine 9.5 mg daily patch, Sinemet 25-100 g, Paxil 30 mg, and mirtazapine 30 mg. 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 At baseline, patient is alert and oriented to self, does not know place or year.  ?- Resumed home meds.  ? ?#Orthostatic hypotension ?On midodrine 10 mg tid  and florinef 0.1 mg twice daily (does not have any adrenal disease). Continued home meds. ? ?#Constipation ?Resumed home miralax twice daily.  ? ?#Hypokalemia:  ?K of 3.3 ?- Klor-con 30 mEq BID two doses ? ?Best Practice: ?Diet: Regular diet  ?IVF: Fluids: none ?VTE: SCDs Start: 06/07/21 1238 ?SCDs Start: 06/06/21 1014 ?Code: DNR ?AB: none ?Therapy Recs: Pending ?Family Contact: Wife, [name], at bedside. ?DISPO: Anticipated discharge to  Memory care unit vs SNF  pending Medical stability. ? ?Signature: ?Dolan Amen, MD ?IMTS, PGY-3 ?06/08/2021,6:41 AM  ?6:41 AM, 06/08/2021  ? ?Please contact the on call pager after 5 pm and on weekends at [phone]. ? ?

## 2021-06-09 LAB — CBC
HCT: 25.8 % — ABNORMAL LOW (ref 39.0–52.0)
Hemoglobin: 8.3 g/dL — ABNORMAL LOW (ref 13.0–17.0)
MCH: 29.4 pg (ref 26.0–34.0)
MCHC: 32.2 g/dL (ref 30.0–36.0)
MCV: 91.5 fL (ref 80.0–100.0)
Platelets: 162 10*3/uL (ref 150–400)
RBC: 2.82 MIL/uL — ABNORMAL LOW (ref 4.22–5.81)
RDW: 13.2 % (ref 11.5–15.5)
WBC: 6.6 10*3/uL (ref 4.0–10.5)
nRBC: 0 % (ref 0.0–0.2)

## 2021-06-09 LAB — BASIC METABOLIC PANEL
Anion gap: 7 (ref 5–15)
BUN: 34 mg/dL — ABNORMAL HIGH (ref 8–23)
CO2: 27 mmol/L (ref 22–32)
Calcium: 8.7 mg/dL — ABNORMAL LOW (ref 8.9–10.3)
Chloride: 105 mmol/L (ref 98–111)
Creatinine, Ser: 0.9 mg/dL (ref 0.61–1.24)
GFR, Estimated: >60 mL/min (ref >60)
Glucose, Bld: 122 mg/dL — ABNORMAL HIGH (ref 70–99)
Potassium: 3.8 mmol/L (ref 3.5–5.1)
Sodium: 139 mmol/L (ref 135–145)

## 2021-06-09 MED ORDER — ACETAMINOPHEN 650 MG RE SUPP
650.0000 mg | Freq: Four times a day (QID) | RECTAL | Status: DC
Start: 2021-06-09 — End: 2021-06-10

## 2021-06-09 MED ORDER — ACETAMINOPHEN 325 MG PO TABS
650.0000 mg | ORAL_TABLET | Freq: Four times a day (QID) | ORAL | Status: DC
  Administered 2021-06-09 – 2021-06-10 (×5): 650 mg via ORAL
  Filled 2021-06-09 (×4): qty 2

## 2021-06-09 NOTE — Progress Notes (Signed)
Left leg dsg changed per orders.  Mepilex changed on the dgs site. ?

## 2021-06-09 NOTE — Progress Notes (Signed)
Pt has been very sleepy today.  Wakes up to the call of his name and to take his medications.  Wife and son and granddaughter has been with the pt most of the shift.  Pt has been inct of stool x1 this shift.  Loose liquid brown stool..  pt has begun to cough when drinking thin liquids and dr made aware.  SLP came up to see and assess the pt and had recommended thickened liquids and will be doing a MBS in  the am.  Pt is doing better with the thickened liquids.   ?

## 2021-06-09 NOTE — Anesthesia Postprocedure Evaluation (Signed)
Anesthesia Post Note ? ?Patient: Stephen Gentry ? ?Procedure(s) Performed: INTRAMEDULLARY (IM) NAIL INTERTROCHANTRIC (Left) ? ?  ? ?Patient location during evaluation: PACU ?Anesthesia Type: General ?Level of consciousness: awake and alert ?Pain management: pain level controlled ?Vital Signs Assessment: post-procedure vital signs reviewed and stable ?Respiratory status: spontaneous breathing, nonlabored ventilation, respiratory function stable and patient connected to nasal cannula oxygen ?Cardiovascular status: blood pressure returned to baseline and stable ?Postop Assessment: no apparent nausea or vomiting ?Anesthetic complications: no ? ? ?No notable events documented. ? ?Last Vitals:  ?Vitals:  ? 06/09/21 1657 06/09/21 1700  ?BP: (!) 106/52   ?Pulse: 70   ?Resp: 19   ?Temp: 37.2 ?C 37.2 ?C  ?SpO2:    ?  ?Last Pain:  ?Vitals:  ? 06/09/21 1700  ?TempSrc: Oral  ?PainSc:   ? ? ?  ?  ?  ?  ?  ?  ? ?Destiny Hagin ? ? ? ? ?

## 2021-06-09 NOTE — TOC Progression Note (Signed)
Transition of Care (TOC) - Progression Note  ? ? ?Patient Details  ?Name: Stephen Gentry ?MRN: [id number] ?Date of Birth: Apr 29, 1934 ? ?Transition of Care (TOC) CM/SW Contact  ?Miro Balderson, [place], [place] ?Phone Number: ?06/09/2021, 9:47 AM ? ?Clinical Narrative:    ?Patient form [employer]. Phone call to [facility], spoke to Medication Tech-Katie [phone]. LOC recommendations discussed SNF vs. Return to [facility]. Per Florentina Addison, she will confirm with her supervisor, it is most likely that he will need rehab stay before his return to [facility]. This Child psychotherapist will await return call. ? ? ?Verlisa Vara, LCSW ?Transition of Care ?[phone] ? ? ? ?  ?  ? ?Expected Discharge Plan and Services ?  ?  ?  ?  ?  ?                ?  ?  ?  ?  ?  ?  ?  ?  ?  ?  ? ? ?Social Determinants of Health (SDOH) Interventions ?  ? ?Readmission Risk Interventions ?   ? View : No data to display.  ?  ?  ?  ? ? ?
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
## 2021-06-09 NOTE — Progress Notes (Signed)
? ?HD#2 ?SUBJECTIVE:  ?Patient Summary: Stephen Gentry is a 87 y.o. with a pertinent PMH of Parkinsons, dementia, hypertension, and atrial flutter/fibrillation on Eliquis, who presented after an unwitnessed fall and admitted for left intertrochanteric fracture of proximal left femur.  ? ?Overnight Events: none ? ?Interim History: Patient assessed at bedside this AM.  He reports pain well controlled when sitting in bed.  When asked if anything is bothering him, he states that he cannot think of anything right now. ? ?OBJECTIVE:  ?Vital Signs: ?Vitals:  ? 06/08/21 1542 06/08/21 1950 06/09/21 0509 06/09/21 0743  ?BP: (!) 103/52 123/80 (!) 142/68 124/65  ?Pulse: 74 88 82 85  ?Resp: 17 18 18 16   ?Temp: 98.4 ?F (36.9 ?C) 98.2 ?F (36.8 ?C) 98.5 ?F (36.9 ?C) 99.9 ?F (37.7 ?C)  ?TempSrc: Oral Oral Oral Oral  ?SpO2: 95% 94% 92% 95%  ?Weight:      ?Height:      ? ?Supplemental O2: Room Air ?SpO2: 95 % ?O2 Flow Rate (L/min): 7 L/min ? ?Filed Weights  ? 06/06/21 1515  ?Weight: 67.3 kg  ? ? ? ?Intake/Output Summary (Last 24 hours) at 06/09/2021 1448 ?Last data filed at 06/09/2021 0500 ?Gross per 24 hour  ?Intake 0 ml  ?Output 325 ml  ?Net -325 ml  ? ?Net IO Since Admission: -575 mL [06/09/21 1448] ? ?Physical Exam: ?Constitutional: Sitting up in bed, thin elderly man, answers questions appropriately ?HENT: normocephalic atraumatic, mucous membranes dry ?Eyes: conjunctiva non-erythematous ?Neck: seborrheic dermatitis along anterior of neck ?Cardiovascular: regular rate and rhythm, no m/r/g ?Pulmonary/Chest: normal work of breathing on room air, lungs clear to auscultation bilaterally ?Abdominal: soft, non-tender, non-distended ?MSK: tenderness to palpation over left hip, surgical sites covered with clean bandages ?Skin: warm and dry ?Psych: flat affect ? ?Patient Lines/Drains/Airways Status   ? ? Active Line/Drains/Airways   ? ? Name Placement date Placement time Site Days  ? Peripheral IV 06/06/21 20 G Anterior;Proximal;Right Forearm  06/06/21  0620  Forearm  3  ? External Urinary Catheter 06/06/21  0636  --  3  ? Incision (Closed) 06/07/21 Hip Left 06/07/21  1001  -- 2  ? ?  ?  ? ?  ? ? ?Pertinent Labs: ? ?  Latest Ref Rng & Units 06/09/2021  ?  3:35 AM 06/08/2021  ?  2:42 AM 06/07/2021  ?  3:07 AM  ?CBC  ?WBC 4.0 - 10.5 K/uL 6.6   6.8   5.5    ?Hemoglobin 13.0 - 17.0 g/dL 8.3   8.3   9.6    ?Hematocrit 39.0 - 52.0 % 25.8   25.8   28.7    ?Platelets 150 - 400 K/uL 162   167   147    ? ? ? ?  Latest Ref Rng & Units 06/09/2021  ?  3:35 AM 06/08/2021  ?  2:42 AM 06/07/2021  ?  3:07 AM  ?CMP  ?Glucose 70 - 99 mg/dL 06/09/2021   505   397    ?BUN 8 - 23 mg/dL 34   31   23    ?Creatinine 0.61 - 1.24 mg/dL 673   4.19   3.79    ?Sodium 135 - 145 mmol/L 139   139   136    ?Potassium 3.5 - 5.1 mmol/L 3.8   3.3   3.6    ?Chloride 98 - 111 mmol/L 105   104   101    ?CO2 22 - 32 mmol/L 27  28   26    ?Calcium 8.9 - 10.3 mg/dL 8.7   8.7   8.7    ? ? ?No results for input(s): GLUCAP in the last 72 hours.  ? ?Pertinent Imaging: ?No results found. ? ?ASSESSMENT/PLAN:  ?Assessment: ?Principal Problem: ?  Hip fracture (HCC) ?Active Problems: ?  Closed intertrochanteric fracture of hip, left, initial encounter (HCC) ? ? ?Stephen Gentry is a 26 y.o. with a pertinent PMH of Parkinsons, dementia, hypertension, and atrial flutter/fibrillation on Eliquis, who presented after an unwitnessed fall and admitted for left intertrochanteric fracture of proximal left femur s/p intramedullary nail on 3/31.  ? ?Plan: ?#Left intertrochanteric fracture of proximal left femur after an unwitnessed fall s/p IMN 3/31 ?Patient is to be weight bearing as tolerated. His pain seems to be well controlled when he is at rest. He received 1 oxycodone-acetaminophen 5-325 mg tabs around 2145 4/1. He was able to work with PT/ OT yesterday. Recommendations for SNF or going back memory care facility at [facility]. Will plan to reach out to his wife to talk introduce concepts of palliative medicine.  ? ?- ortho  following ?- Daily CBC and BMP ?- Tylenol q6h prn for mild pain ?- Percocet 5-325 mg q4h for severe pain ?- PT/OT as able ?  ?#Acute blood loss anemia ?Hb 12.7 about 6 months ago, Hgb stable at 8.3 from 4/1. Suspect this drop in Hb is in the setting of blood loss after the patient's fall and hip fracture.  ?- Daily CBC ?  ?#Paroxysmal atrial fibrillation/flutter on eliquis ?Patient is on eliquis 5 mg bid and presented after an unwitnessed fall. Initially held in setting of surgery and due to fall. Eliquis was restarted 4/1. Patient may not be the best candidate for anticoagulation therapy with history of recurrent falls recently. Talked with his wife about this and she would prefer him to continue taking eliquis at this time. ?-eliquis 5 mg BID ?  ?#Parkinson's disease ?#Dementia ?Patient diagnosed with Parkinson's in 2014 and currently is on rivastigmine 9.5 mg daily patch, Sinemet 25-100 g, Paxil 30 mg, and mirtazapine 30 mg. 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 At baseline, patient is alert and oriented to self, does not know place or year.  ? ?- Resumed home meds.  ?  ?#Dysphagia ?Patient noted to have difficulty with swallowing regular solids. This is likely 2/2 to parkinson's disease. His wife states this is the first time this has happened to him. ? ?-SLP consulted ?-Barium swallow ?-Diet updated to honey thickened liquids ? ?#Orthostatic hypotension ?On midodrine 10 mg tid and florinef 0.1 mg twice daily (does not have any adrenal disease). Continued home meds. He was orthostatic with ambulation when working with PT yesterday. ? ?-midodrine 10 mg, TID ?-Florinef 0.1 mg BID ?  ?#Constipation ?Resumed home miralax twice daily. Large bowel movement 4/1. ?  ?#Hypokalemia:  ?K of 3.8. ?-BMP ? ? ?Best Practice: ?Diet: Cardiac diet ?GOT:LXBW ?VTE: SCDs Start: 06/07/21 1238 ?SCDs Start: 06/06/21 1014 ?Code: DNR ?AB: none ?Therapy  Recs: SNF, DME: wheelchair with cushion ?Family Contact: patient's wife, [name]. Updated at bedside. ?DISPO: Anticipated discharge in 1-2 days to Skilled nursing facility pending Medical stability. ? ?Signature: ?[facility]. Xaviera Flaten, D.O.  ?Internal Medicine Resident, PGY-1 ?[facility]  ?Pager: [phone] ?2:48 PM, 06/09/2021  ? ?Please contact the on call pager after 5 pm and on weekends at [phone].  ?

## 2021-06-09 NOTE — Progress Notes (Signed)
? ?                              Orthopaedic Trauma Service Progress Note ? ?Patient ID: ?Stephen Gentry ?MRN: [id number] ?DOB/AGE: 85-Sep-1936 85 y.o. ? ?Subjective: ? ?No acute ortho issues ? ?Wife and Nurse note some issues with swallowing solids.  Can tolerate applesauce type textures.  Regular solids causing him to cough ? ?Wife going to look into memory care division at his current facility  ? ?Has noted progressive worsening of balance. Really unable to use rollator any more.  Feels wheelchair may be safest option at this point.but would really like for him to be able to stand-pivot and transfer ? ? ?ROS ?As above ? ?Objective:  ? ?VITALS:   ?Vitals:  ? 06/08/21 1542 06/08/21 1950 06/09/21 0509 06/09/21 0743  ?BP: (!) 103/52 123/80 (!) 142/68 124/65  ?Pulse: 74 88 82 85  ?Resp: 17 18 18 16   ?Temp: 98.4 ?F (36.9 ?C) 98.2 ?F (36.8 ?C) 98.5 ?F (36.9 ?C) 99.9 ?F (37.7 ?C)  ?TempSrc: Oral Oral Oral Oral  ?SpO2: 95% 94% 92% 95%  ?Weight:      ?Height:      ? ? ?Estimated body mass index is 20.12 kg/m? as calculated from the following: ?  Height as of this encounter: 6' (1.829 m). ?  Weight as of this encounter: 67.3 kg. ? ? ?Intake/Output   ?   04/01 0701 ?04/02 0700 04/02 0701 ?04/03 0700  ? I.V. (mL/kg) 0 (0)   ? Total Intake(mL/kg) 0 (0)   ? Urine (mL/kg/hr) 625 (0.4)   ? Stool 0   ? Blood    ? Total Output 625   ? Net -625   ?     ? Stool Occurrence 3 x   ?  ? ?LABS ? ?Results for orders placed or performed during the hospital encounter of 06/06/21 (from the past 24 hour(s))  ?Basic metabolic panel     Status: Abnormal  ? Collection Time: 06/09/21  3:35 AM  ?Result Value Ref Range  ? Sodium 139 135 - 145 mmol/L  ? Potassium 3.8 3.5 - 5.1 mmol/L  ? Chloride 105 98 - 111 mmol/L  ? CO2 27 22 - 32 mmol/L  ? Glucose, Bld 122 (H) 70 - 99 mg/dL  ? BUN 34 (H) 8 - 23 mg/dL  ? Creatinine, Ser 0.90 0.61 - 1.24 mg/dL  ? Calcium 8.7 (L) 8.9 - 10.3 mg/dL  ? GFR, Estimated >60 >60 mL/min  ?  Anion gap 7 5 - 15  ?CBC     Status: Abnormal  ? Collection Time: 06/09/21  3:35 AM  ?Result Value Ref Range  ? WBC 6.6 4.0 - 10.5 K/uL  ? RBC 2.82 (L) 4.22 - 5.81 MIL/uL  ? Hemoglobin 8.3 (L) 13.0 - 17.0 g/dL  ? HCT 25.8 (L) 39.0 - 52.0 %  ? MCV 91.5 80.0 - 100.0 fL  ? MCH 29.4 26.0 - 34.0 pg  ? MCHC 32.2 30.0 - 36.0 g/dL  ? RDW 13.2 11.5 - 15.5 %  ? Platelets 162 150 - 400 K/uL  ? nRBC 0.0 0.0 - 0.2 %  ? ? ? ?PHYSICAL EXAM:  ? ?Gen: sleepy but comfortable  ?Ext:  ?     Left Lower Extremity  ?            Dressings L hip clean  ?  Incisions healing nicely  ?  No drainage ?  No erythema  ?            Ext warm  ?            Swelling minimal ?            Motor and sensory functions intact ?            No DCT  ? ?Assessment/Plan: ?2 Days Post-Op  ? ?Principal Problem: ?  Hip fracture ([place]) ?Active Problems: ?  Closed intertrochanteric fracture of hip, left, initial encounter ([place]) ? ? ?Anti-infectives (From admission, onward)  ? ? Start     Dose/Rate Route Frequency Ordered Stop  ? 06/07/21 1800  ceFAZolin (ANCEF) IVPB 2g/100 mL premix       ? 2 g ?200 mL/hr over 30 Minutes Intravenous Every 8 hours 06/07/21 1237 06/08/21 1030  ? 06/07/21 0900  ceFAZolin (ANCEF) IVPB 2g/100 mL premix       ? 2 g ?200 mL/hr over 30 Minutes Intravenous On call to O.R. 06/07/21 [id number] 06/07/21 0915  ? 06/07/21 0808  ceFAZolin (ANCEF) 2-4 GM/100ML-% IVPB       ?Note to Pharmacy: Gregery Na H: cabinet override  ?    06/07/21 [id number] 06/07/21 0920  ? ?  ?. ? ?POD/HD#: 56 ? ?87 year old male ground-level fall with left intertrochanteric hip fracture ?  ?-Fall ?  ?-Left intertrochanteric hip fracture treated with intramedullary nailing ?            Weight-bear as tolerated left leg ?            Range of motion as tolerated left hip and knee ?            Therapies ?            Ice as needed for swelling and pain control ?            Dressing changes as needed ?  Ok to clean wounds with soap and water only  ? ? Discussed that pt may be wheelchair  dependent from this point forward given his progressive parkinsons disease.  Wife is on board with this and was thinking the same ?  ?- Pain management: ?            Multimodal ?            Minimize narcotics ? Will schedule tylenol  ? ?- ABL anemia/Hemodynamics ?            Monitor ?            Expected blood loss for injury and surgery ? Looks stable  ?  ?- Medical issues  ?            Per primary ?  ? Dysphagia  ?  Bedside swallow eval and diet recs  ?  ?- DVT/PE prophylaxis: ?            eliquis restarted yesterday ? H/H and plts stable ?  ?- ID:  ?            Periop antibiotics completed  ?  ?- Metabolic Bone Disease: ?            Fracture is a fragility fracture despite normal vitamin D levels ?            Recommend DEXA in the next 4 to 8 weeks ?            Would likely benefit from outpatient referral to  osteoporosis specialist ?  ?- Activity: ?            Weight-bear as tolerated with assistance on left leg ?  ?- Impediments to fracture healing: ?            Low energy hip fracture suggestive of poor bone quality ?            Parkinsons (multifactorial) ?  ?- Dispo: ?            Therapies ? Will like need SNF before transfer to memory care unit  ?  ? ? ?Jari Pigg, PA-C ?[phone] (C) ?06/09/2021, 11:59 AM ? ?[facility] ?[place] [address] ?[phone] Jenetta Downer) ?[phone] (F) ? ? ? ?After 5pm and on the weekends please log on to Amion, go to orthopaedics and the look under the [facility] Call for the provider(s) on call. You can also call our office at [phone] and then follow the prompts to be connected to the call team.  ? Patient ID: Stephen Gentry, male   DOB: [birth year], 62 y.o.   MRN: [id number] ? ?

## 2021-06-09 NOTE — TOC Initial Note (Signed)
Transition of Care (TOC) - Initial/Assessment Note  ? ? ?Patient Details  ?Name: Stephen Gentry ?MRN: [id number] ?Date of Birth: 1934-06-05 ? ?Transition of Care (TOC) CM/SW Contact:    ?Devlin Brink, [place], [place] ?Phone Number: ?06/09/2021, 11:53 AM ? ?Clinical Narrative:                 ?Phone call to patient's spouse to discuss PT recommendations. Patient's spouse would like patient to return to [facility], memory care unit. This social worker contacted [facility] to discuss anticipated discharge plans. Spoke with Florentina Addison who stated that they need for patient to to go to rehab before his return to [facility]. Patient's spouse not agreeable to SNF stay and is hopeful that after meeting with the Medical Director Steward Ros, patient would be able to return . Medical Director contacted, VM left requesting a return call. ? ?Transition of Care to continue to follow ? ? ?Babbie Dondlinger, LCSW ?Transition of Care ?[phone] ? ? ?Expected Discharge Plan:  (pending) ?Barriers to Discharge: Continued Medical Work up ? ? ?Patient Goals and CMS Choice ?Patient states their goals for this hospitalization and ongoing recovery are:: Spouse would like patient to return to [facility]-memory care unit ?  ?  ? ?Expected Discharge Plan and Services ?Expected Discharge Plan:  (pending) ?In-house Referral: Clinical Social Work ?  ?  ?Living arrangements for the past 2 months:  ([facility]) ?                ?  ?  ?  ?  ?  ?  ?  ?  ?  ?  ? ?Prior Living Arrangements/Services ?Living arrangements for the past 2 months:  ([facility]) ?Lives with:: Facility Resident ([facility] memory care) ?  ?       ?Need for Family Participation in Patient Care: Yes (Comment) ?Care giver support system in place?: Yes (comment) ?  ?Criminal Activity/Legal Involvement Pertinent to Current Situation/Hospitalization: No - Comment as needed ? ?Activities of Daily Living ?Home Assistive  Devices/Equipment: Jeannette How (specify type) (rollator) ?ADL Screening (condition at time of admission) ?Patient's cognitive ability adequate to safely complete daily activities?: No ?Is the patient deaf or have difficulty hearing?: No ?Does the patient have difficulty seeing, even when wearing glasses/contacts?: No ?Does the patient have difficulty concentrating, remembering, or making decisions?: Yes ?Patient able to express need for assistance with ADLs?: No ?Does the patient have difficulty dressing or bathing?: Yes ?Independently performs ADLs?: No ?Communication: Needs assistance ?Is this a change from baseline?: Pre-admission baseline ?Dressing (OT): Dependent ?Is this a change from baseline?: Pre-admission baseline ?Grooming: Dependent ?Is this a change from baseline?: Pre-admission baseline ?Feeding: Independent ?Bathing: Dependent ?Is this a change from baseline?: Pre-admission baseline ?Toileting: Dependent ?Is this a change from baseline?: Pre-admission baseline ?In/Out Bed: Needs assistance ?Is this a change from baseline?: Pre-admission baseline ?Walks in Home: Needs assistance ?Is this a change from baseline?: Pre-admission baseline ?Does the patient have difficulty walking or climbing stairs?: Yes ?Weakness of Legs: Both ?Weakness of Arms/Hands: Both ? ?Permission Sought/Granted ?  ?  ?   ?   ?   ?   ? ?Emotional Assessment ?  ?  ?  ?  ?Alcohol / Substance Use: Not Applicable ?Psych Involvement: No (comment) ? ?Admission diagnosis:  Hip fracture (HCC) [S72.009A] ?Closed intertrochanteric fracture of hip, left, initial encounter (HCC) [S72.142A] ?Patient Active Problem List  ? Diagnosis Date Noted  ? Closed  intertrochanteric fracture of hip, left, initial encounter (HCC)   ? Hip fracture (HCC) 06/06/2021  ? Weight loss 05/17/2018  ? Chronic anticoagulation 05/17/2018  ? Sigmoid volvulus (HCC) 04/26/2018  ? Atrial flutter (HCC) 01/23/2018  ? Orthostatic hypotension 01/22/2018  ? Constipation  09/25/2016  ? Anxiety 04/24/2016  ? 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 Vitamin deficiency 02/19/2015  ? Memory loss 02/19/2015  ? Vitamin D deficiency 02/19/2015  ? Other fatigue 02/19/2015  ? Mild depression 08/18/2014  ? Disturbed cognition 08/18/2014  ? Snoring 08/18/2014  ? 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 Parkinson's disease (HCC) 02/07/2014  ? ?PCP:  Annita Brod, MD ?Pharmacy:   ?[facility] - [place], [place] - [address] ?[address] ?[place] [address] ?Phone: [phone] Fax: [phone] ? ? ? ? ?Social Determinants of Health (SDOH) Interventions ?  ? ?Readmission Risk Interventions ?   ? View : No data to display.  ?  ?  ?  ? ? ? ?

## 2021-06-10 ENCOUNTER — Encounter (HOSPITAL_COMMUNITY): Payer: Self-pay | Admitting: Student

## 2021-06-10 ENCOUNTER — Inpatient Hospital Stay (HOSPITAL_COMMUNITY): Payer: Medicare Other

## 2021-06-10 DIAGNOSIS — G20 Parkinson's disease: Secondary | ICD-10-CM | POA: Diagnosis not present

## 2021-06-10 DIAGNOSIS — K5900 Constipation, unspecified: Secondary | ICD-10-CM

## 2021-06-10 DIAGNOSIS — E43 Unspecified severe protein-calorie malnutrition: Secondary | ICD-10-CM | POA: Insufficient documentation

## 2021-06-10 DIAGNOSIS — D62 Acute posthemorrhagic anemia: Secondary | ICD-10-CM | POA: Diagnosis not present

## 2021-06-10 DIAGNOSIS — S72142A Displaced intertrochanteric fracture of left femur, initial encounter for closed fracture: Secondary | ICD-10-CM | POA: Diagnosis not present

## 2021-06-10 DIAGNOSIS — I480 Paroxysmal atrial fibrillation: Secondary | ICD-10-CM | POA: Diagnosis not present

## 2021-06-10 DIAGNOSIS — R1310 Dysphagia, unspecified: Secondary | ICD-10-CM

## 2021-06-10 DIAGNOSIS — I951 Orthostatic hypotension: Secondary | ICD-10-CM

## 2021-06-10 DIAGNOSIS — F0280 Dementia in other diseases classified elsewhere without behavioral disturbance: Secondary | ICD-10-CM

## 2021-06-10 LAB — CBC
HCT: 25.5 % — ABNORMAL LOW (ref 39.0–52.0)
Hemoglobin: 8 g/dL — ABNORMAL LOW (ref 13.0–17.0)
MCH: 29.4 pg (ref 26.0–34.0)
MCHC: 31.4 g/dL (ref 30.0–36.0)
MCV: 93.8 fL (ref 80.0–100.0)
Platelets: 154 10*3/uL (ref 150–400)
RBC: 2.72 MIL/uL — ABNORMAL LOW (ref 4.22–5.81)
RDW: 13.3 % (ref 11.5–15.5)
WBC: 4.8 10*3/uL (ref 4.0–10.5)
nRBC: 0 % (ref 0.0–0.2)

## 2021-06-10 LAB — BASIC METABOLIC PANEL
Anion gap: 4 — ABNORMAL LOW (ref 5–15)
BUN: 30 mg/dL — ABNORMAL HIGH (ref 8–23)
CO2: 30 mmol/L (ref 22–32)
Calcium: 8.9 mg/dL (ref 8.9–10.3)
Chloride: 108 mmol/L (ref 98–111)
Creatinine, Ser: 0.76 mg/dL (ref 0.61–1.24)
GFR, Estimated: >60 mL/min (ref >60)
Glucose, Bld: 112 mg/dL — ABNORMAL HIGH (ref 70–99)
Potassium: 3.6 mmol/L (ref 3.5–5.1)
Sodium: 142 mmol/L (ref 135–145)

## 2021-06-10 MED ORDER — ORAL CARE MOUTH RINSE
15.0000 mL | Freq: Two times a day (BID) | OROMUCOSAL | Status: DC
  Administered 2021-06-10 – 2021-06-12 (×5): 15 mL via OROMUCOSAL

## 2021-06-10 MED ORDER — POTASSIUM CHLORIDE 10 MEQ/100ML IV SOLN
10.0000 meq | INTRAVENOUS | Status: AC
  Administered 2021-06-10 (×3): 10 meq via INTRAVENOUS
  Filled 2021-06-10 (×3): qty 100

## 2021-06-10 MED ORDER — OXYCODONE HCL 5 MG PO TABS: 5.0000 mg | ORAL_TABLET | ORAL | Status: DC | PRN

## 2021-06-10 MED ORDER — ADULT MULTIVITAMIN W/MINERALS CH
1.0000 | ORAL_TABLET | Freq: Every day | ORAL | Status: DC
Start: 2021-06-10 — End: 2021-06-12
  Administered 2021-06-10 – 2021-06-12 (×3): 1 via ORAL
  Filled 2021-06-10 (×3): qty 1

## 2021-06-10 MED ORDER — LACTULOSE ENEMA
300.0000 mL | Freq: Once | ORAL | Status: DC | PRN
  Filled 2021-06-10: qty 300

## 2021-06-10 MED ORDER — POTASSIUM CHLORIDE 20 MEQ PO PACK: 20.0000 meq | PACK | Freq: Two times a day (BID) | ORAL | Status: DC

## 2021-06-10 MED ORDER — ACETAMINOPHEN 500 MG PO TABS
1000.0000 mg | ORAL_TABLET | Freq: Three times a day (TID) | ORAL | Status: DC
  Administered 2021-06-10 – 2021-06-12 (×5): 1000 mg via ORAL
  Filled 2021-06-10 (×5): qty 2

## 2021-06-10 MED ORDER — CHLORHEXIDINE GLUCONATE 0.12 % MT SOLN
15.0000 mL | Freq: Two times a day (BID) | OROMUCOSAL | Status: DC
  Administered 2021-06-10 – 2021-06-12 (×5): 15 mL via OROMUCOSAL
  Filled 2021-06-10 (×5): qty 15

## 2021-06-10 MED ORDER — ACETAMINOPHEN 325 MG PO TABS
650.0000 mg | ORAL_TABLET | Freq: Four times a day (QID) | ORAL | Status: AC | PRN
Start: 2021-06-10 — End: ?

## 2021-06-10 MED ORDER — OXYCODONE-ACETAMINOPHEN 5-325 MG PO TABS: 1.0000 | ORAL_TABLET | Freq: Three times a day (TID) | ORAL | 0 refills | Status: DC | PRN

## 2021-06-10 NOTE — Progress Notes (Signed)
Speech Language Pathology Treatment:    ?Patient Details ?Name: VISHAAL STROLLO ?MRN: [id number] ?DOB: 31-Dec-1934 ?Today's Date: 06/10/2021 ?Time:  -  ?  ? ?MBS will be 12:00 today  ? ? ? ?   ? ? ? ? ?  ?  ? ? ?Royce Macadamia ? ?06/10/2021, 8:12 AM ?

## 2021-06-10 NOTE — Progress Notes (Signed)
MD made aware of patient's distention and hypoactive BS. Awaiting response/orders at this time.  ?

## 2021-06-10 NOTE — Discharge Instructions (Signed)
? ?Orthopaedic Trauma Service Discharge Instructions ? ? ?General Discharge Instructions ? ?WEIGHT BEARING STATUS: Weightbearing as tolerated left lower extremity ? ?RANGE OF MOTION/ACTIVITY: Okay for unrestricted hip and knee motion as tolerated ? ?Wound Care: Incisions can be left open to air if there is no drainage. If incision continues to have drainage, follow wound care instructions below. Okay to shower if no drainage from incisions. ? ?DVT/PE prophylaxis:  Home dose Eliquis ? ?Diet: as you were eating previously.  Can use over the counter stool softeners and bowel preparations, such as Miralax, to help with bowel movements.  Narcotics can be constipating.  Be sure to drink plenty of fluids ? ?PAIN MEDICATION USE AND EXPECTATIONS ? You have likely been given narcotic medications to help control your pain.  After a traumatic event that results in an fracture (broken bone) with or without surgery, it is ok to use narcotic pain medications to help control one's pain.  We understand that everyone responds to pain differently and each individual patient will be evaluated on a regular basis for the continued need for narcotic medications. Ideally, narcotic medication use should last no more than 6-8 weeks (coinciding with fracture healing).  ? As a patient it is your responsibility as well to monitor narcotic medication use and report the amount and frequency you use these medications when you come to your office visit.  ? We would also advise that if you are using narcotic medications, you should take a dose prior to therapy to maximize you participation. ? ?IF YOU ARE ON NARCOTIC MEDICATIONS IT IS NOT PERMISSIBLE TO OPERATE A MOTOR VEHICLE (MOTORCYCLE/CAR/TRUCK/MOPED) OR HEAVY MACHINERY ?DO NOT MIX NARCOTICS WITH OTHER CNS ([place]) DEPRESSANTS SUCH AS ALCOHOL ? ? ?STOP SMOKING OR USING NICOTINE PRODUCTS!!!! ? As discussed nicotine severely impairs your body's ability to heal surgical and  traumatic wounds but also impairs bone healing.  Wounds and bone heal by forming microscopic blood vessels (angiogenesis) and nicotine is a vasoconstrictor (essentially, shrinks blood vessels).  Therefore, if vasoconstriction occurs to these microscopic blood vessels they essentially disappear and are unable to deliver necessary nutrients to the healing tissue.  This is one modifiable factor that you can do to dramatically increase your chances of healing your injury.   ? (This means no smoking, no nicotine gum, patches, etc) ? ?DO NOT USE NONSTEROIDAL ANTI-INFLAMMATORY DRUGS (NSAID'S) ? Using products such as Advil (ibuprofen), Aleve (naproxen), Motrin (ibuprofen) for additional pain control during fracture healing can delay and/or prevent the healing response.  If you would like to take over the counter (OTC) medication, Tylenol (acetaminophen) is ok.  However, some narcotic medications that are given for pain control contain acetaminophen as well. Therefore, you should not exceed more than 4000 mg of tylenol in a day if you do not have liver disease.  Also note that there are may OTC medicines, such as cold medicines and allergy medicines that my contain tylenol as well.  If you have any questions about medications and/or interactions please ask your doctor/PA or your pharmacist.  ?   ? ?ICE AND ELEVATE INJURED/OPERATIVE EXTREMITY ? Using ice and elevating the injured extremity above your heart can help with swelling and pain control.  Icing in a pulsatile fashion, such as 20 minutes on and 20 minutes off, can be followed.   ? Do not place ice directly on skin. Make sure there is a barrier between to skin and the ice pack.   ? Using frozen items such  as frozen peas works well as the conform nicely to the are that needs to be iced. ? ?USE AN ACE WRAP OR TED HOSE FOR SWELLING CONTROL ? In addition to icing and elevation, Ace wraps or TED hose are used to help limit and resolve swelling.  It is recommended to use  Ace wraps or TED hose until you are informed to stop.   ? When using Ace Wraps start the wrapping distally (farthest away from the body) and wrap proximally (closer to the body) ?  Example: If you had surgery on your leg or thing and you do not have a splint on, start the ace wrap at the toes and work your way up to the thigh ?       If you had surgery on your upper extremity and do not have a splint on, start the ace wrap at your fingers and work your way up to the upper arm ? ? ?CALL THE OFFICE WITH ANY QUESTIONS OR CONCERNS: [phone]  ? ?VISIT OUR WEBSITE FOR ADDITIONAL INFORMATION: [email or web address] ?  ? ? ?Discharge Wound Care Instructions ? ?Do NOT apply any ointments, solutions or lotions to pin sites or surgical wounds.  These prevent needed drainage and even though solutions like hydrogen peroxide kill bacteria, they also damage cells lining the pin sites that help fight infection.  Applying lotions or ointments can keep the wounds moist and can cause them to breakdown and open up as well. This can increase the risk for infection. When in doubt call the office. ? ? ?If any drainage is noted, use foam dressing  ? ?Once the incision is completely dry and without drainage, it may be left open to air out.  Showering may begin 36-48 hours later.  Cleaning gently with soap and water. ?

## 2021-06-10 NOTE — Progress Notes (Signed)
Orthopaedic Trauma Progress Note ? ?SUBJECTIVE: Resting comfortably in bed this morning, no reports of pain.  Has been doing better on thickened liquids.  Scheduled for swallow study later today.  No family at bedside currently.  Orthopedic issues stable. ? ?OBJECTIVE:  ?Vitals:  ? 06/10/21 0500 06/10/21 0735  ?BP: (!) 118/55 132/67  ?Pulse: (!) 58 77  ?Resp: 14   ?Temp:  97.9 ?F (36.6 ?C)  ?SpO2: 100% 100%  ? ? ?General: Sleeping comfortably in bed, NAD ?Respiratory: No increased work of breathing.  ?LLE:  Dressing CDI.  Tolerates gentle ankle range of motion.  Compartments soft and compressible.  2+ DP pulse ? ?IMAGING: Stable post op imaging.  ? ?LABS:  ?Results for orders placed or performed during the hospital encounter of 06/06/21 (from the past 24 hour(s))  ?Basic metabolic panel     Status: Abnormal  ? Collection Time: 06/10/21 12:35 AM  ?Result Value Ref Range  ? Sodium 142 135 - 145 mmol/L  ? Potassium 3.6 3.5 - 5.1 mmol/L  ? Chloride 108 98 - 111 mmol/L  ? CO2 30 22 - 32 mmol/L  ? Glucose, Bld 112 (H) 70 - 99 mg/dL  ? BUN 30 (H) 8 - 23 mg/dL  ? Creatinine, Ser 0.76 0.61 - 1.24 mg/dL  ? Calcium 8.9 8.9 - 10.3 mg/dL  ? GFR, Estimated >60 >60 mL/min  ? Anion gap 4 (L) 5 - 15  ?CBC     Status: Abnormal  ? Collection Time: 06/10/21 12:35 AM  ?Result Value Ref Range  ? WBC 4.8 4.0 - 10.5 K/uL  ? RBC 2.72 (L) 4.22 - 5.81 MIL/uL  ? Hemoglobin 8.0 (L) 13.0 - 17.0 g/dL  ? HCT 25.5 (L) 39.0 - 52.0 %  ? MCV 93.8 80.0 - 100.0 fL  ? MCH 29.4 26.0 - 34.0 pg  ? MCHC 31.4 30.0 - 36.0 g/dL  ? RDW 13.3 11.5 - 15.5 %  ? Platelets 154 150 - 400 K/uL  ? nRBC 0.0 0.0 - 0.2 %  ? ? ?ASSESSMENT: Stephen Gentry is a 87 y.o. male, 3 Days Post-Op s/p INTRAMEDULLARY NAIL LEFT INTERTROCHANTRIC FEMUR FRACTURE ? ?CV/Blood loss: Acute blood loss anemia, Hgb 8.0 this morning.  Trending down slightly but overall stable.   ? ?PLAN: ?Weightbearing: WBAT LLE ?ROM: Okay for unrestricted range of motion of the left hip and knee ?Incisional and  dressing care: Okay to leave incisions open to air ?Showering: Okay to begin showering getting incisions wet ?Orthopedic device(s): None  ?Pain management:  ?1. Tylenol 650 mg q 6 hours PRN ?2. Percocet 5-325 mg q 4 hours PRN ? ?VTE prophylaxis:  Eliquis , SCDs ?ID:  Ancef 2gm post op completed ?Foley/Lines:  No foley, KVO IVFs ?Impediments to Fracture Healing: Vitamin D level 52, no additional supplementation indicated. ?Dispo: Therapies as tolerated, PT/OT recommending SNF.  Okay for discharge from ortho standpoint once cleared by medicine team and therapies ?D/C recommendations: ?-Percocet 5-325 milligram for pain not controlled with Tylenol ?-Continue home dose Eliquis for DVT prophylaxis ?-No additional Vit D supplementation needed ? ?Follow - up plan: 2 weeks ? ? ?Contact information:  Katha Hamming MD, Rushie Nyhan PA-C. After hours and holidays please check [email or web address] for group call information for [facility] ? ? ?Gwinda Passe, PA-C ?[phone] (office) ?[email or web address] ? ? ? ?

## 2021-06-10 NOTE — Progress Notes (Signed)
Speech Language Pathology Treatment: Dysphagia  ?Patient Details ?Name: Stephen Gentry ?MRN: [id number] ?DOB: 06-13-1934 ?Today's Date: 06/10/2021 ?Time: 7322-0254 ?SLP Time Calculation (min) (ACUTE ONLY): 20 min ? ?Assessment / Plan / Recommendation ?Clinical Impression ? Session included education of MBS, swallow precautions and treatment plan. Wife feeding pt soft mac n cheese, honey thick juice as SLP arrived. Therapist showed pt video of normal swallow and her husband's MBS with explanation. Also demonstrated the safest method for feeding pt (looking for larynx to elevate, watching for 2nd swallow, how to cue pt and importance of slow pace and rest breaks as needed. He consumed soft solid and thick liquids with multiple swallow and intermittent wet vocal quality. He is deconditioned and is easily fatigued and needs slow pace and rest breaks. Continue ST.  ?  ?HPI HPI: Pt admitted after a fall at a memory care unit, Sustained left hip fracture and underwent IM nail on 06/07/21.  PMH significant for but not limited to: Parkinsons, dementia, falls, anemia, PAF, orthostatic hypotension. MD notes states wife and RN note some difficulty swallowing solids ?  ?   ?SLP Plan ? Continue with current plan of care ? ?  ?  ?Recommendations for follow up therapy are one component of a multi-disciplinary discharge planning process, led by the attending physician.  Recommendations may be updated based on patient status, additional functional criteria and insurance authorization. ?  ? ?Recommendations  ?Diet recommendations: Regular;Honey-thick liquid;Other(comment) (family to order soft foods) ?Liquids provided via: Cup;Teaspoon ?Medication Administration: Crushed with puree ?Supervision: Full supervision/cueing for compensatory strategies ?Compensations: Minimize environmental distractions;Slow rate;Small sips/bites;Multiple dry swallows after each bite/sip;Clear throat intermittently ?Postural Changes and/or Swallow Maneuvers: Seated  upright 90 degrees  ?   ?    ?   ? ? ? ? Oral Care Recommendations: Oral care BID ?Follow Up Recommendations: Skilled nursing-short term rehab (<3 hours/day) ?Assistance recommended at discharge: Frequent or constant Supervision/Assistance ?SLP Visit Diagnosis: Dysphagia, oropharyngeal phase (R13.12) ?Plan: Continue with current plan of care ? ? ? ? ?  ?  ? ? ?Stephen Gentry ? ?06/10/2021, 3:01 PM ?

## 2021-06-10 NOTE — Progress Notes (Signed)
Patient alert and oriented to self. Turned for comfort. Very lethargic and drowsy. No pain noted. Will continue to monitor. BP remains hypotensive to normal.  ? 06/10/21 0401  ?Assess: MEWS Score  ?Temp 97.7 ?F (36.5 ?C)  ?BP 134/77  ?Pulse Rate 64  ?ECG Heart Rate 68  ?Resp 12  ?SpO2 96 %  ?O2 Device Room Air  ?Patient Activity (if Appropriate) In bed  ?Assess: MEWS Score  ?MEWS Temp 0  ?MEWS Systolic 0  ?MEWS Pulse 0  ?MEWS RR 1  ?MEWS LOC 1  ?MEWS Score 2  ?MEWS Score Color Yellow  ?Assess: if the MEWS score is Yellow or Red  ?Were vital signs taken at a resting state? Yes  ?Focused Assessment No change from prior assessment  ?Early Detection of Sepsis Score *See Row Information* Low  ?MEWS guidelines implemented *See Row Information* No, previously yellow, continue vital signs every 4 hours  ?Treat  ?Pain Scale PAINAD  ?Pain Score 0  ?Breathing 0  ?Negative Vocalization 0  ?Facial Expression 0  ?Body Language 0  ?Consolability 0  ?PAINAD Score 0  ?Take Vital Signs  ?Increase Vital Sign Frequency   ?(Yellow mews previously, continue vital signs every 4 hours)  ?Escalate  ?MEWS: Escalate  ?(Yellow mews previously, continue vital signs every 4 hours)  ?Document  ?Patient Outcome Other (Comment) ?(no change in outcome; patient stable, no s/s of distress)  ?Progress note created (see row info) Yes  ? ? ?

## 2021-06-10 NOTE — TOC Progression Note (Addendum)
Transition of Care (TOC) - Progression Note  ? ? ?Patient Details  ?Name: Stephen Gentry ?MRN: [id number] ?Date of Birth: 1935-01-23 ? ?Transition of Care (TOC) CM/SW Contact  ?Lorri Frederick, LCSW ?Phone Number: ?06/10/2021, 11:59 AM ? ?Clinical Narrative:   CSW spoke with [name] who reports she spoke with [facility] this AM and they do want pt to go to SNF prior to return to memory care.  Stephen Gentry requesting [place] location, permission given to send out referral in hub.   ? ?1430: Pt sent out in hub for SNF.  PASSR went to level 2.  ? ?Expected Discharge Plan:  (pending) ?Barriers to Discharge: Continued Medical Work up ? ?Expected Discharge Plan and Services ?Expected Discharge Plan:  (pending) ?In-house Referral: Clinical Social Work ?  ?  ?Living arrangements for the past 2 months:  ([facility]) ?                ?  ?  ?  ?  ?  ?  ?  ?  ?  ?  ? ? ?Social Determinants of Health (SDOH) Interventions ?  ? ?Readmission Risk Interventions ?   ? View : No data to display.  ?  ?  ?  ? ? ?

## 2021-06-10 NOTE — Progress Notes (Addendum)
Initial Nutrition Assessment ? ?DOCUMENTATION CODES:  ?Severe malnutrition in context of chronic illness ? ?INTERVENTION:  ?-MVI with minerals daily ?-transition to inappropriate for room service ?-encourage po intake ?-Magic cup TID with meals, each supplement provides 290 kcal and 9 grams of protein (or honey thick substitution if magic cups aren't available) ? ?NUTRITION DIAGNOSIS:  ?Severe Malnutrition related to chronic illness (Parkinsons, dementia) as evidenced by severe muscle depletion, severe fat depletion. ? ?GOAL:  ?Patient will meet greater than or equal to 90% of their needs ? ?MONITOR:  ?PO intake, Supplement acceptance, Diet advancement, Weight trends, Labs, I & O's ? ?REASON FOR ASSESSMENT:  ?Consult ?Assessment of nutrition requirement/status ? ?ASSESSMENT:  ?Pt with PMH significant for Parkinsons, dementia, anemia, PAF, and HTN admitted after fall at memory care unit where he sustained a L hip fracture. Pt underwent IM nail on 06/07/21. ? ?Pt provided no responses to RD questions. Discussed pt with RN who reports pt with poor PO intake. Per RN, pt to undergo MBS this afternoon. Currently on heart healthy diet with honey thickened liquids. Will follow for possible diet advancement and ensure honey thickened supplements are provided.  ? ?Weight history reviewed. No significant weight changes observed. ? ?PO Intake: none documented  ? ?UOP: 956ml x24 hours ?I/O: +435ml since admit ? ?Medications: ? docusate sodium  100 mg Oral BID  ? polyethylene glycol  34 g Oral BID  ? senna  1 tablet Oral BID  ? ?Labs: ?Recent Labs  ?Lab 06/08/21 ?0242 06/09/21 ?[id number] 06/10/21 ?[id number]  ?NA 139 139 142  ?K 3.3* 3.8 3.6  ?CL 104 105 108  ?CO2 28 27 30   ?BUN 31* 34* 30*  ?CREATININE 1.06 0.90 0.76  ?CALCIUM 8.7* 8.7* 8.9  ?GLUCOSE 129* 122* 112*  ?Hgb 8.0 ? ?NUTRITION - FOCUSED PHYSICAL EXAM: ?Flowsheet Row Most Recent Value  ?Orbital Region Severe depletion  ?Upper Arm Region Severe depletion  ?Thoracic and Lumbar  Region Severe depletion  ?Buccal Region Severe depletion  ?Temple Region Severe depletion  ?Clavicle Bone Region Severe depletion  ?Clavicle and Acromion Bone Region Severe depletion  ?Scapular Bone Region Severe depletion  ?Dorsal Hand Moderate depletion  ?Patellar Region Severe depletion  ?Anterior Thigh Region Severe depletion  ?Posterior Calf Region Severe depletion  ?Edema (RD Assessment) None  ?Hair Reviewed  ?Eyes Reviewed  ?Mouth Reviewed  ?Skin Reviewed  ?Nails Reviewed  ? ?Diet Order:   ?Diet Order   ? ?       ?  Diet Heart Room service appropriate? Yes; Fluid consistency: Honey Thick  Diet effective now       ?  ? ?  ?  ? ?  ? ?EDUCATION NEEDS:  ?Not appropriate for education at this time ? ?Skin:  Skin Assessment: Skin Integrity Issues: ?Skin Integrity Issues:: Incisions ?Incisions: L hip ? ?Last BM:  4/02 ? ?Height:  ?Ht Readings from Last 1 Encounters:  ?06/06/21 6' (1.829 m)  ? ?Weight:  ?Wt Readings from Last 3 Encounters:  ?06/06/21 67.3 kg  ?11/20/20 69.9 kg  ?05/11/20 67.1 kg  ? ? ?BMI:  Body mass index is 20.12 kg/m?. ? ?Estimated Nutritional Needs:  ?Kcal:  1800-2000 ?Protein:  90-100 grams ?Fluid:  >1.8L ? ? ? ? ?Theone Stanley., MS, RD, LDN (she/her/hers) ?RD pager number and weekend/on-call pager number located in [place]. ? ?

## 2021-06-10 NOTE — Progress Notes (Signed)
Physical Therapy Treatment ?Patient Details ?Name: Stephen Gentry ?MRN: [id number] ?DOB: March 09, 1935 ?Today's Date: 06/10/2021 ? ? ?History of Present Illness 87 y.o. male presents to [facility] hospital on 06/06/2021 after experiencing a fall while at [facility] care. Pt found to have a displaced and comminuted intertrochanteric L femur fx. Pt underwent IM nailing of L femur on 06/07/2021. PMH includes Parkinson's, dementia, hypertension, atrial flutter/fibrillation (on Eliquis). ? ?  ?PT Comments  ? ? Pt was seen for mobility today and was extremely lethargic.  Pt was sent for MBS and had returned but was unable to engage in much verbal interaction, very dependent to roll on the bed or move LE's.  Repositioned pt to chair in bed, with head redirected more neutrally and pulled to top of bed for better sit posture to try and get him to eat.  Pt is being fed by wife at this point.  Recommend a lift to get to the chair with staff. Pt may be able to try to stand if more alert, which he had been assisted to do last visit for evaluation.  Focus on goals of PT, assessing him for safety with mobility and recommend him to be in a chair if safe to do so.  Pt is not aware of where he is and was initially complaining of his feet being pressured on end of bed.  Pt was not aware of the place he was positioned, and will need extensive supervision for his care.  Therefore recommend SNF care due to depth of deficits and potential to walk given PLOF.     ?Recommendations for follow up therapy are one component of a multi-disciplinary discharge planning process, led by the attending physician.  Recommendations may be updated based on patient status, additional functional criteria and insurance authorization. ? ?Follow Up Recommendations ? Skilled nursing-short term rehab (<3 hours/day) ?  ?  ?Assistance Recommended at Discharge Frequent or constant Supervision/Assistance  ?Patient can return home with the following Two people to help with  walking and/or transfers;Two people to help with bathing/dressing/bathroom;Assistance with feeding;Direct supervision/assist for medications management;Direct supervision/assist for financial management;Assist for transportation ?  ?Equipment Recommendations ? Wheelchair (measurements PT);Wheelchair cushion (measurements PT)  ?  ?Recommendations for Other Services   ? ? ?  ?Precautions / Restrictions Precautions ?Precautions: Fall ?Precaution Comments: pt is extremely lethargic ?Restrictions ?Weight Bearing Restrictions: No ?LLE Weight Bearing: Weight bearing as tolerated  ?  ? ?Mobility ? Bed Mobility ?Overal bed mobility: Needs Assistance ?Bed Mobility: Rolling ?Rolling: Total assist ?  ?  ?  ?  ?General bed mobility comments: total assist to scoot up in bed and to roll on the bed ?  ? ?Transfers ?Overall transfer level: Needs assistance ?  ?  ?  ?  ?  ?  ?  ?  ?General transfer comment: pt is too lethargic to attempt ?  ? ?Ambulation/Gait ?  ?  ?  ?  ?  ?  ?  ?  ? ? ?Stairs ?  ?  ?  ?  ?  ? ? ?Wheelchair Mobility ?  ? ?Modified Rankin (Stroke Patients Only) ?  ? ? ?  ?Balance   ?  ?  ?  ?  ?  ?  ?  ?  ?  ?  ?  ?  ?  ?  ?  ?  ?  ?  ?  ? ?  ?Cognition Arousal/Alertness: Lethargic ?Behavior During Therapy: Flat affect ?Overall Cognitive Status: History of  cognitive impairments - at baseline ?  ?  ?  ?  ?  ?  ?  ?  ?  ?  ?  ?  ?  ?  ?  ?  ?General Comments: Pt has advanced Parkinson's and dementia ?  ?  ? ?  ?Exercises General Exercises - Lower Extremity ?Ankle Circles/Pumps: AAROM, 5 reps ?Heel Slides: AAROM, 5 reps ?Hip ABduction/ADduction: AAROM, 10 reps ? ?  ?General Comments General comments (skin integrity, edema, etc.): assisted pt to move legs in bed and then tried to get him to assist.  Pt is very sleepy and does not verbally engage with PT after initial talk ?  ?  ? ?Pertinent Vitals/Pain Pain Assessment ?Pain Assessment: Faces ?Faces Pain Scale: Hurts a little bit ?Breathing: normal ?Negative Vocalization:  none ?Facial Expression: smiling or inexpressive ?Body Language: relaxed ?Consolability: no need to console ?PAINAD Score: 0 ?Pain Location: left hip and abdomen ?Pain Descriptors / Indicators: Guarding  ? ? ?Home Living   ?  ?  ?  ?  ?  ?  ?  ?  ?  ?   ?  ?Prior Function    ?  ?  ?   ? ?PT Goals (current goals can now be found in the care plan section) Acute Rehab PT Goals ?Patient Stated Goal: none stated ?Progress towards PT goals: Not progressing toward goals - comment (lethargic and very dependent) ? ?  ?Frequency ? ? ? Min 3X/week ? ? ? ?  ?PT Plan Equipment recommendations need to be updated  ? ? ?Co-evaluation   ?  ?  ?  ?  ? ?  ?AM-PAC PT "6 Clicks" Mobility   ?Outcome Measure ? Help needed turning from your back to your side while in a flat bed without using bedrails?: Total ?Help needed moving from lying on your back to sitting on the side of a flat bed without using bedrails?: Total ?Help needed moving to and from a bed to a chair (including a wheelchair)?: Total ?Help needed standing up from a chair using your arms (e.g., wheelchair or bedside chair)?: Total ?Help needed to walk in hospital room?: Total ?Help needed climbing 3-5 steps with a railing? : Total ?6 Click Score: 6 ? ?  ?End of Session   ?Activity Tolerance: Patient limited by fatigue;Patient limited by lethargy ?Patient left: in bed;with call bell/phone within reach;with family/visitor present;Other (comment) (speech therapy had just arrived) ?Nurse Communication: Mobility status ?PT Visit Diagnosis: Repeated falls (R29.6) ?  ? ? ?Time: [id number] ?PT Time Calculation (min) (ACUTE ONLY): 28 min ? ?Charges:  $Therapeutic Exercise: 8-22 mins ?$Therapeutic Activity: 8-22 mins      ?Ramond Dial ?06/10/2021, 4:28 PM ? ?Mee Hives, PT PhD ?Acute Rehab Dept. Number: [facility] [id number] and [place] [phone] ? ? ?

## 2021-06-10 NOTE — [place] FL2 (Signed)
?[place] MEDICAID FL2 LEVEL OF CARE SCREENING TOOL  ?  ? ?IDENTIFICATION  ?Patient Name: ?Stephen Gentry Birthdate: February 04, 1935 Sex: male Admission Date (Current Location): ?06/06/2021  ?[place] Number: ? [place] ?  Facility and Address:  ?The . [facility], [address], [place], [place] [address] ?     Provider Number: ?[id number]  ?Attending Physician Name and Address:  ?Dickie La, MD ? Relative Name and Phone Number:  ?McCaffrey,[name] Spouse [phone] ?   ?Current Level of Care: ?Hospital Recommended Level of Care: ?Skilled Nursing Facility Prior Approval Number: ?  ? ?Date Approved/Denied: ?  PASRR Number: ?  ? ?Discharge Plan: ?SNF ?  ? ?Current Diagnoses: ?Patient Active Problem List  ? Diagnosis Date Noted  ? Closed intertrochanteric fracture of hip, left, initial encounter (HCC)   ? Hip fracture (HCC) 06/06/2021  ? Weight loss 05/17/2018  ? Chronic anticoagulation 05/17/2018  ? Sigmoid volvulus (HCC) 04/26/2018  ? Atrial flutter (HCC) 01/23/2018  ? Orthostatic hypotension 01/22/2018  ? Constipation 09/25/2016  ? Anxiety 04/24/2016  ? 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 Vitamin deficiency 02/19/2015  ? Memory loss 02/19/2015  ? Vitamin D deficiency 02/19/2015  ? Other fatigue 02/19/2015  ? Mild depression 08/18/2014  ? Disturbed cognition 08/18/2014  ? Snoring 08/18/2014  ? 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 Parkinson's disease (HCC) 02/07/2014  ? ? ?Orientation RESPIRATION BLADDER Height & Weight   ?  ?Self ? Normal Incontinent, External catheter Weight: 148 lb 5.9 oz (67.3 kg) ?Height:  6' (182.9 cm)  ?BEHAVIORAL SYMPTOMS/MOOD NEUROLOGICAL BOWEL NUTRITION STATUS  ?    Incontinent Diet (see discharge summary)  ?AMBULATORY STATUS COMMUNICATION OF NEEDS Skin   ?Total Care Verbally Other (Comment) (ecchymosis) ?  ?  ?  ?    ?     ?     ? ? ?Personal Care Assistance Level of Assistance  ?Bathing, Feeding, Dressing, Total care Bathing  Assistance: Maximum assistance ?Feeding assistance: Maximum assistance ?Dressing Assistance: Maximum assistance ?Total Care Assistance: Maximum assistance  ? ?Functional Limitations Info  ?Sight, Hearing, Speech Sight Info: Adequate ?Hearing Info: Adequate ?Speech Info: Adequate  ? ? ?SPECIAL CARE FACTORS FREQUENCY  ?PT (By licensed PT), OT (By licensed OT)   ?  ?PT Frequency: 5x week ?OT Frequency: 5x week ?  ?  ?  ?   ? ? ?Contractures Contractures Info: Not present  ? ? ?Additional Factors Info  ?Code Status, Allergies Code Status Info: DNR ?Allergies Info: Lorazepam, Tetracyclines & Related, Donepezil, Amitiza (Lubiprostone) ?  ?  ?  ?   ? ?Current Medications (06/10/2021):  This is the current hospital active medication list ?Current Facility-Administered Medications  ?Medication Dose Route Frequency Provider Last Rate Last Admin  ? 0.9 %  sodium chloride infusion   Intravenous Continuous West Bali, PA-C 50 mL/hr at 06/10/21 0600 Infusion Verify at 06/10/21 0600  ? acetaminophen (TYLENOL) tablet 1,000 mg  1,000 mg Oral Q8H Merrilyn Puma, MD      ? apixaban Everlene Balls) tablet 5 mg  5 mg Oral BID Montez Morita, PA-C   5 mg at 06/10/21 3559  ? Carbidopa-Levodopa ER (SINEMET CR) 25-100 MG tablet controlled release 1 tablet  1 tablet Oral QHS West Bali, PA-C   1 tablet at 06/08/21 2145  ? Carbidopa-Levodopa ER (SINEMET CR) 25-100 MG tablet controlled release 2 tablet  2 tablet Oral TID WC McClung, Sarah A, PA-C   2 tablet at 06/10/21 1257  ?  chlorhexidine (PERIDEX) 0.12 % solution 15 mL  15 mL Mouth Rinse BID Miguel Aschoff, MD   15 mL at 06/10/21 3267  ? docusate sodium (COLACE) capsule 100 mg  100 mg Oral BID West Bali, PA-C   100 mg at 06/10/21 1245  ? fludrocortisone (FLORINEF) tablet 0.2 mg  0.2 mg Oral BID West Bali, PA-C   0.2 mg at 06/10/21 8099  ? lactulose (CHRONULAC) enema 200 gm  300 mL Rectal Once PRN Marolyn Haller, MD      ? MEDLINE mouth rinse  15 mL Mouth Rinse q12n4p  Miguel Aschoff, MD   15 mL at 06/10/21 1258  ? metoCLOPramide (REGLAN) tablet 5-10 mg  5-10 mg Oral Q8H PRN West Bali, PA-C      ? Or  ? metoCLOPramide (REGLAN) injection 5-10 mg  5-10 mg Intravenous Q8H PRN West Bali, PA-C      ? midodrine (PROAMATINE) tablet 10 mg  10 mg Oral TID Thyra Breed A, PA-C   10 mg at 06/10/21 8338  ? mirtazapine (REMERON) tablet 30 mg  30 mg Oral QHS Thyra Breed A, PA-C   30 mg at 06/09/21 2120  ? ondansetron (ZOFRAN) tablet 4 mg  4 mg Oral Q6H PRN West Bali, PA-C      ? Or  ? ondansetron (ZOFRAN) injection 4 mg  4 mg Intravenous Q6H PRN Sharon Seller, Sarah A, PA-C      ? oxyCODONE (Oxy IR/ROXICODONE) immediate release tablet 5 mg  5 mg Oral Q4H PRN Merrilyn Puma, MD      ? PARoxetine (PAXIL) tablet 30 mg  30 mg Oral Daily Thyra Breed A, PA-C   30 mg at 06/10/21 2505  ? polyethylene glycol (MIRALAX / GLYCOLAX) packet 34 g  34 g Oral BID West Bali, PA-C   34 g at 06/10/21 3976  ? rivastigmine (EXELON) 9.5 mg/24hr 9.5 mg  9.5 mg Transdermal Daily Thyra Breed A, PA-C   9.5 mg at 06/10/21 7341  ? senna (SENOKOT) tablet 8.6 mg  1 tablet Oral BID West Bali, PA-C   8.6 mg at 06/10/21 9379  ? ? ? ?Discharge Medications: ?Please see discharge summary for a list of discharge medications. ? ?Relevant Imaging Results: ? ?Relevant Lab Results: ? ? ?Additional Information ?SSN: [id number], pt is vaccinated for covid but not boosted. ? ?Lorri Frederick, LCSW ? ? ? ? ?

## 2021-06-10 NOTE — Progress Notes (Signed)
? ? ?  RE:  Stephen Gentry       ?Date of Birth:  05/22/1934     ?Date:   06/10/21     ? ? ?To Whom It May Concern: ? ?Please be advised that the above-named patient will require a short-term nursing home stay - anticipated 30 days or less for rehabilitation and strengthening.  The plan is for return home. ? ? ?              ?MD signature ? ?              ?Date ?

## 2021-06-10 NOTE — Care Management Important Message (Signed)
Important Message ? ?Patient Details  ?Name: Stephen Gentry ?MRN: [id number] ?Date of Birth: 05-Jul-1934 ? ? ?Medicare Important Message Given:  Yes ? ? ? ? ?Stephen Land  [name] ?06/10/2021, 2:49 PM ?

## 2021-06-10 NOTE — Progress Notes (Signed)
? ?HD#3 ?SUBJECTIVE:  ?Patient Summary: Stephen Gentry is a 87 y.o. with a pertinent PMH of Parkinsons, dementia, hypertension, and atrial flutter/fibrillation on Eliquis, who presented after an unwitnessed fall and admitted for left intertrochanteric fracture of proximal left femur.  ? ?Overnight Events: Abdominal distention.  No tenderness to palpation.  Patient was hemodynamically stable.  He was given an enema. ? ?Interim History: Patient assessed at bedside this AM. He is sleepy during encounter but responds appropriately. States he does not have any pain. No concerns or complaints at this time. ? ?OBJECTIVE:  ?Vital Signs: ?Vitals:  ? 06/10/21 0300 06/10/21 0401 06/10/21 0401 06/10/21 0500  ?BP: (!) 120/58 134/77 134/77 (!) 118/55  ?Pulse: 60 64 64 (!) 58  ?Resp: 15 15 12 14   ?Temp:  97.7 ?F (36.5 ?C) 97.7 ?F (36.5 ?C)   ?TempSrc:   Oral   ?SpO2: 100%  96% 100%  ?Weight:      ?Height:      ? ?Supplemental O2: Room Air ?SpO2: 100 % ?O2 Flow Rate (L/min): 7 L/min ? ?Filed Weights  ? 06/06/21 1515  ?Weight: 67.3 kg  ? ? ? ?Intake/Output Summary (Last 24 hours) at 06/10/2021 0650 ?Last data filed at 06/10/2021 0600 ?Gross per 24 hour  ?Intake 1085.53 ml  ?Output 900 ml  ?Net 185.53 ml  ? ? ?Net IO Since Admission: -389.47 mL [06/10/21 0650] ? ?Physical Exam: ?General: thin elderly male, lying in bed, drowsy but responsive to verbal stimuli, answers questions appropriately, NAD. ?CV: normal rate and regular rhythm, no m/r/g. ?Pulm: normal work of breathing on RA, CTABL. ?Abdomen: soft, nontender, mildly distended, normoactive bowel sounds. ?MSK: surgical sites c/d/I ?Skin: warm and dry ?Psych: flat affect ? ? ?Patient Lines/Drains/Airways Status   ? ? Active Line/Drains/Airways   ? ? Name Placement date Placement time Site Days  ? Peripheral IV 06/06/21 20 G Anterior;Proximal;Right Forearm 06/06/21  0620  Forearm  3  ? External Urinary Catheter 06/06/21  0636  --  3  ? Incision (Closed) 06/07/21 Hip Left 06/07/21  1001   -- 2  ? ?  ?  ? ?  ? ? ?Pertinent Labs: ? ?  Latest Ref Rng & Units 06/10/2021  ? 12:35 AM 06/09/2021  ?  3:35 AM 06/08/2021  ?  2:42 AM  ?CBC  ?WBC 4.0 - 10.5 K/uL 4.8   6.6   6.8    ?Hemoglobin 13.0 - 17.0 g/dL 8.0   8.3   8.3    ?Hematocrit 39.0 - 52.0 % 25.5   25.8   25.8    ?Platelets 150 - 400 K/uL 154   162   167    ? ? ? ?  Latest Ref Rng & Units 06/10/2021  ? 12:35 AM 06/09/2021  ?  3:35 AM 06/08/2021  ?  2:42 AM  ?CMP  ?Glucose 70 - 99 mg/dL 08/08/2021   782   423    ?BUN 8 - 23 mg/dL 30   34   31    ?Creatinine 0.61 - 1.24 mg/dL 536   1.44   3.15    ?Sodium 135 - 145 mmol/L 142   139   139    ?Potassium 3.5 - 5.1 mmol/L 3.6   3.8   3.3    ?Chloride 98 - 111 mmol/L 108   105   104    ?CO2 22 - 32 mmol/L 30   27   28     ?Calcium 8.9 - 10.3 mg/dL 8.9  8.7   8.7    ? ? ?No results for input(s): GLUCAP in the last 72 hours.  ? ?Pertinent Imaging: ?No results found. ? ?ASSESSMENT/PLAN:  ?Assessment: ?Principal Problem: ?  Hip fracture (HCC) ?Active Problems: ?  Closed intertrochanteric fracture of hip, left, initial encounter (HCC) ? ? ?Stephen Gentry is a 87 y.o. with a pertinent PMH of Parkinsons, dementia, hypertension, and atrial flutter/fibrillation on Eliquis, who presented after an unwitnessed fall and admitted for left intertrochanteric fracture of proximal left femur s/p intramedullary nail on 3/31.  ? ?Plan: ?#Left intertrochanteric fracture of proximal left femur after an unwitnessed fall s/p IMN 3/31 ?Pain appears to be well tolerated, has not asked for any pain medications for the past day. Has been drowsy for the past 2 days, unclear if 2/2 surgery vs medications. Will monitor today and if he remains drowsy tomorrow, consider medication adjustments. Spoke with spouse, memory care unit would like for patient to go to SNF first for rehab. Spouse is agreeable with this. TOC working on SNF placement, appreciate assistance. Otherwise, he is medically stable. Remains WBAT in LLE and resumed home eliquis.  ?-appreciate  ortho and TOC assistance ?-CBC and BMP stable for past 2 days, will check every other day while here ?-transitioned to scheduled tylenol for pain control, oxycodone IR q4 prn for breakthrough pain ?-Medically stable pending SNF placement ?-will need ortho outpatient f/u in 2 weeks ?-if remains drowsy tomorrow, consider adjusting doses of centrally acting medications ?  ?#Acute blood loss anemia ?Suspect this drop in Hb is in the setting of blood loss after the patient's fall and hip fracture. Hgb has remained stable over the past few days, will check CBC every other day. ?  ?#Paroxysmal atrial fibrillation/flutter on eliquis ?Patient is on eliquis 5 mg bid and presented after an unwitnessed fall. Initially held in setting of surgery and due to fall. Eliquis was restarted 4/1. Patient may not be the best candidate for anticoagulation therapy with history of recurrent falls recently. Talked with his wife about this and she would prefer him to continue taking eliquis at this time. ?-eliquis 5 mg BID ?  ?#Parkinson's disease ?#Dementia ?Patient diagnosed with Parkinson's in 2014 and currently is on rivastigmine 9.5 mg daily patch, Sinemet 25-100 g, Paxil 30 mg, and mirtazapine 30 mg. Patient is a retired Radio producercollege professor and per his wife, he has had a quick progression of his dementia and Parkinson's. At baseline, patient is alert and oriented to self, does not know place or year.  ?- Resumed home meds.  ?  ?#Dysphagia ?Patient noted to have difficulty with swallowing regular solids. This is likely 2/2 to parkinson's disease. His wife states this is the first time this has happened to him. ?-HH diet with honey thick fluids ?-F/u SLP eval ? ?#Orthostatic hypotension ?On midodrine 10 mg tid and florinef 0.1 mg twice daily (does not have any adrenal disease). Continued home meds. He was orthostatic with ambulation when working with PT yesterday. ?-midodrine 10 mg, TID ?-Florinef 0.1 mg BID ?  ?#Constipation ?-resumed home  miralax twice daily ? ?Best Practice: ?Diet: Cardiac diet ?WUJ:WJXBVF:none ?VTE: SCDs Start: 06/07/21 1238 ?SCDs Start: 06/06/21 1014 ?Code: DNR ?AB: none ?Therapy Recs: SNF, DME: wheelchair with cushion, home health SLP ?Family Contact: patient's wife, [name]. Updated via phone call. ?DISPO: Anticipated discharge in 1-2 days to Skilled nursing facility pending Medical stability. ? ?Signature: ?Merrilyn PumaSagar Jonice Cerra, MD ?Internal Medicine Resident, PGY-2 ?[facility]  ?Pager: [phone] ?6:50  AM, 06/10/2021  ? ?Please contact the on call pager after 5 pm and on weekends at [phone].  ?

## 2021-06-10 NOTE — Progress Notes (Signed)
Modified Barium Swallow Progress Note ? ?Patient Details  ?Name: Stephen Gentry ?MRN: [id number] ?Date of Birth: [birth year] ? ?Today's Date: 06/10/2021 ? ?Modified Barium Swallow completed.  Full report located under Chart Review in the Imaging Section. ? ?Brief recommendations include the following: ? ?Clinical Impression ? Pt was drowsy but adequately awake for MBS revealing mild oral and mild-mod pharyngeal dysphagia. Orally there was mild delay with initial bite but became more timely as study progressed. Pharyngeal phase is characterized by decreased tongue base retraction, pharyngeal contraction and hyolaryngeal elevation. Nectar and thin liquids were penetrated (PAS 3, 5-without awareness) and thin via straw was aspirated (PAS 7). He cannot achieve an effective volitional cough to clear penetrates/aspirates. Vallecular and pyriform sinuses residue ranged mild-max with thicker consistencies although not a significant difference between nectar and honey and cues for subsequent swallows were intermittently effective. Recommend continue regular texture with wife/family ordering softer consistencies, honey thick liquids, crush meds, rest breaks, second swallows, throat clears, sit upright and slow pace. ?  ?Swallow Evaluation Recommendations ? ?   ? ? SLP Diet Recommendations: Regular solids;Honey thick liquids;Other (Comment) (family orders soft foods) ? ? Liquid Administration via: Spoon;Cup ? ? Medication Administration: Crushed with puree ? ? Supervision: Full assist for feeding;Full supervision/cueing for compensatory strategies ? ? Compensations: Slow rate;Small sips/bites;Multiple dry swallows after each bite/sip;Clear throat intermittently;Minimize environmental distractions ? ? Postural Changes: Seated upright at 90 degrees;Remain semi-upright after after feeds/meals (Comment) ? ? Oral Care Recommendations: Oral care BID ? ?   ? ? ? ?Royce Macadamia ?06/10/2021,2:53 PM ?

## 2021-06-10 NOTE — Progress Notes (Signed)
Patient very lethargic and sleeping often. Patient responds to voice. He is alert to self. Patient answers questions appropriately. He is very weak and stiff. Patient tongue red and dry. Patient not ingesting appropriate amount of oral fluids to maintain hydration. IVF restarted as ordered.  ?

## 2021-06-11 MED ORDER — NAPHAZOLINE-GLYCERIN 0.012-0.25 % OP SOLN
1.0000 [drp] | Freq: Four times a day (QID) | OPHTHALMIC | Status: DC | PRN
  Filled 2021-06-11: qty 15

## 2021-06-11 MED ORDER — POTASSIUM CHLORIDE 20 MEQ PO PACK
40.0000 meq | PACK | Freq: Two times a day (BID) | ORAL | Status: AC
  Administered 2021-06-11 (×2): 40 meq via ORAL
  Filled 2021-06-11 (×3): qty 2

## 2021-06-11 NOTE — TOC Progression Note (Addendum)
Transition of Care (TOC) - Progression Note  ? ? ?Patient Details  ?Name: Stephen Gentry ?MRN: [id number] ?Date of Birth: 05/13/1934 ? ?Transition of Care (TOC) CM/SW Contact  ?Lorri Frederick, LCSW ?Phone Number: ?06/11/2021, 8:38 AM ? ?Clinical Narrative:   passr approved: [id number] A ? ?CSW spoke with [name], presented bed offers, she would like to accept offer from [facility].  CSW confirmed with [facility] that they can accept pt today.  No auth needed. MD informed, per MD wants to keep pt one more night prior to DC.  [facility] informed.   ? ? ? ?Expected Discharge Plan:  (pending) ?Barriers to Discharge: Continued Medical Work up ? ?Expected Discharge Plan and Services ?Expected Discharge Plan:  (pending) ?In-house Referral: Clinical Social Work ?  ?  ?Living arrangements for the past 2 months:  ([facility]) ?                ?  ?  ?  ?  ?  ?  ?  ?  ?  ?  ? ? ?Social Determinants of Health (SDOH) Interventions ?  ? ?Readmission Risk Interventions ?   ? View : No data to display.  ?  ?  ?  ? ? ?

## 2021-06-11 NOTE — Plan of Care (Signed)
  Problem: Pain Managment: Goal: General experience of comfort will improve Outcome: Progressing   

## 2021-06-11 NOTE — Progress Notes (Signed)
? ?HD#4 ?SUBJECTIVE:  ?Patient Summary: Stephen Gentry is a 87 y.o. with a pertinent PMH of Parkinsons, dementia, hypertension, and atrial flutter/fibrillation on Eliquis, who presented after an unwitnessed fall and admitted for left intertrochanteric fracture of proximal left femur.  ?  ?Overnight Events:  ?none ?  ?Interim History: Patient assessed at bedside this AM. He is sleepy during encounter but responds appropriately. States he does not have any pain. No concerns or complaints at this time. ? ?OBJECTIVE:  ?Vital Signs: ?Vitals:  ? 06/11/21 0200 06/11/21 0305 06/11/21 0721 06/11/21 1502  ?BP:  (!) 128/58 119/61 119/69  ?Pulse:   77   ?Resp: 15 17    ?Temp:   (!) 97.4 ?F (36.3 ?C) 97.7 ?F (36.5 ?C)  ?TempSrc:   Axillary Oral  ?SpO2:   98% 98%  ?Weight:      ?Height:      ? ?Supplemental O2: Room Air ?SpO2: 98 % ?O2 Flow Rate (L/min): 7 L/min ? ?Filed Weights  ? 06/06/21 1515  ?Weight: 67.3 kg  ? ? ? ?Intake/Output Summary (Last 24 hours) at 06/11/2021 1508 ?Last data filed at 06/11/2021 0900 ?Gross per 24 hour  ?Intake 887.67 ml  ?Output --  ?Net 887.67 ml  ? ?Net IO Since Admission: 1,330.12 mL [06/11/21 1508] ? ?Physical Exam: ?Physical Exam ?Constitutional:   ?   Comments: Answers questions appropriately, opens eyes and tracks speaker.  ?HENT:  ?   Mouth/Throat:  ?   Mouth: Mucous membranes are dry.  ?Cardiovascular:  ?   Rate and Rhythm: Normal rate. Rhythm irregular.  ?   Pulses: Normal pulses.  ?   Heart sounds: Normal heart sounds.  ?Pulmonary:  ?   Effort: Pulmonary effort is normal.  ?   Breath sounds: Normal breath sounds.  ?Abdominal:  ?   General: Abdomen is flat. There is no distension.  ?   Palpations: Abdomen is soft.  ?   Tenderness: There is no abdominal tenderness.  ?Musculoskeletal:  ?   Comments: Bandage in place over left hip  ?Skin: ?   General: Skin is warm and dry.  ?Neurological:  ?   Comments: Flat affect, oriented to self, sleepy  ? ? ?Patient Lines/Drains/Airways Status   ? ? Active  Line/Drains/Airways   ? ? Name Placement date Placement time Site Days  ? Peripheral IV 06/06/21 20 G Anterior;Proximal;Right Forearm 06/06/21  0620  Forearm  5  ? External Urinary Catheter 06/06/21  0636  --  5  ? Incision (Closed) 06/07/21 Hip Left 06/07/21  1001  -- 4  ? ?  ?  ? ?  ? ? ?Pertinent Labs: ? ?  Latest Ref Rng & Units 06/10/2021  ? 12:35 AM 06/09/2021  ?  3:35 AM 06/08/2021  ?  2:42 AM  ?CBC  ?WBC 4.0 - 10.5 K/uL 4.8   6.6   6.8    ?Hemoglobin 13.0 - 17.0 g/dL 8.0   8.3   8.3    ?Hematocrit 39.0 - 52.0 % 25.5   25.8   25.8    ?Platelets 150 - 400 K/uL 154   162   167    ? ? ? ?  Latest Ref Rng & Units 06/10/2021  ? 12:35 AM 06/09/2021  ?  3:35 AM 06/08/2021  ?  2:42 AM  ?CMP  ?Glucose 70 - 99 mg/dL 415   830   940    ?BUN 8 - 23 mg/dL 30   34   31    ?  Creatinine 0.61 - 1.24 mg/dL 1.610.76   0.960.90   0.451.06    ?Sodium 135 - 145 mmol/L 142   139   139    ?Potassium 3.5 - 5.1 mmol/L 3.6   3.8   3.3    ?Chloride 98 - 111 mmol/L 108   105   104    ?CO2 22 - 32 mmol/L 30   27   28     ?Calcium 8.9 - 10.3 mg/dL 8.9   8.7   8.7    ? ? ?No results for input(s): GLUCAP in the last 72 hours.  ? ?Pertinent Imaging: ?No results found. ? ?ASSESSMENT/PLAN:  ?Assessment: ?Principal Problem: ?  Hip fracture (HCC) ?Active Problems: ?  Closed intertrochanteric fracture of hip, left, initial encounter (HCC) ?  Protein-calorie malnutrition, severe ? ? ?Stephen Gentry is a 87 y.o. with a pertinent PMH of Parkinsons, dementia, hypertension, and atrial flutter/fibrillation on Eliquis, who presented after an unwitnessed fall and admitted for left intertrochanteric fracture of proximal left femur. ? ?Plan: ?#Left intertrochanteric fracture of proximal left femur after an unwitnessed fall s/p IMN 3/31 ?Pain seems to be well controlled on scheduled Tylenol.  He has been able to work with physical therapy to stand with maximum assistance.  Memory care is not able to accept patient back.  Plan to go to rehab.  Resumed home eliquis.  Need to follow-up  with Ortho in 2 weeks.  [facility] will [facility]. ? ?-appreciate ortho and TOC assistance ?-transitioned to scheduled tylenol for pain control, oxycodone IR q4 prn for breakthrough pain ?-Medically stable pending SNF placement ?-will need ortho outpatient f/u in 2 weeks ?-We will discontinue mirtazapine to see if this helps with drowsiness. ?  ?#Acute blood loss anemia ?Suspect this drop in Hb is in the setting of blood loss after the patient's fall and hip fracture. Hgb has remained stable over the past few days. ?  ?#Paroxysmal atrial fibrillation/flutter on eliquis ?Patient is on eliquis 5 mg bid and presented after an unwitnessed fall. Initially held in setting of surgery and due to fall. Eliquis was restarted 4/1. Patient may not be the best candidate for anticoagulation therapy with history of recurrent falls recently. Talked with his wife about this and she would prefer him to continue taking eliquis at this time. ? ?-eliquis 5 mg BID ?  ?#Parkinson's disease ?#Dementia ?Patient diagnosed with Parkinson's in 2014 and currently is on rivastigmine 9.5 mg daily patch, Sinemet 25-100 g, Paxil 30 mg, and mirtazapine 30 mg. Patient is a retired Radio producercollege professor and per his wife, he has had a quick progression of his dementia and Parkinson's. At baseline, patient is alert and oriented to self, does not know place or year.  ? ?- Resumed home meds.  ?  ?#Dysphagia ?Modified barium swallow completed and showed mild oral dysphagia and mild to moderate pharyngeal dysphagia. ?  ?#Orthostatic hypotension ?On midodrine 10 mg tid and florinef 0.1 mg twice daily (does not have any adrenal disease). Continued home meds. He was orthostatic with ambulation when working with PT yesterday. ? ?-midodrine 10 mg, TID ?-Florinef 0.1 mg BID ?  ?#Constipation ?-resumed home miralax twice daily ?  ?Best Practice: ?Diet: Cardiac diet ?WUJ:WJXBVF:none ?VTE: SCDs Start: 06/07/21 1238 ?SCDs Start: 06/06/21 1014 ?Code: DNR ?AB:  none ?Therapy Recs: SNF, DME: wheelchair with cushion, home health SLP ?Family Contact: patient's wife, Stephen Gentry. Updated via phone call. ?DISPO: Anticipated discharge tomorrow to Skilled nursing facility pending Medical stability. ? ?Signature: ?  Rudene Christians, D.O. ?Internal Medicine Resident, PGY-1 ?[facility]  ?Pager: [phone] ?3:08 PM, 06/11/2021  ? ?Please contact the on call pager after 5 pm and on weekends at [phone].  ?

## 2021-06-11 NOTE — Discharge Summary (Addendum)
? ?Name: Stephen Gentry ?MRN: [id number] ?DOB: 15-Dec-1934 86 y.o. ?PCP: Annita Brod, MD ? ?Date of Admission: 06/06/2021  6:18 AM ?Date of Discharge: 06/12/21 ?Attending Physician: Dickie La, MD ? ?Discharge Diagnosis: ?1.  Left intratrochanteric fracture of proximal left femur status post intramedullary nail ? ?Chronic: ?2.  Paroxysmal atrial fibrillation on anticoagulation ?3.  Parkinson's disease ?4.  Dementia ?5.  Orthostatic hypotension ?6.  Idiopathic constipation ? ?Discharge Medications: ?Allergies as of 06/12/2021   ? ?   Reactions  ? Lorazepam Other (See Comments)  ? Combative, foul language. ?Wife doesn't want patient to have again  ? Tetracyclines & Related Anaphylaxis  ? Donepezil Other (See Comments)  ? Didn't like  ? Amitiza [lubiprostone] Nausea Only  ? ?  ? ?  ?Medication List  ?  ? ?STOP taking these medications   ? ?mirtazapine 30 MG tablet ?Commonly known as: Remeron ?  ? ?  ? ?TAKE these medications   ? ?acetaminophen 325 MG tablet ?Commonly known as: TYLENOL ?Take 2 tablets (650 mg total) by mouth every 6 (six) hours as needed for mild pain, fever or headache. ?  ?apixaban 5 MG Tabs tablet ?Commonly known as: Eliquis ?Take 1 tablet (5 mg total) by mouth 2 (two) times daily. ?  ?Carbidopa-Levodopa ER 25-100 MG tablet controlled release ?Commonly known as: SINEMET CR ?Take 2 tablets by mouth 3 (three) times daily. 2 tabs at 6 am, 11 am, 4 pm ?1 tab at 8 pm ?  ?Cholecalciferol 50 MCG (2000 UT) Caps ?Take 2,000 Units by mouth daily. ?  ?cyanocobalamin 1000 MCG/ML injection ?Commonly known as: (VITAMIN B-12) ?Inject 1,000 mcg into the muscle every 30 (thirty) days. ?  ?fludrocortisone 0.1 MG tablet ?Commonly known as: FLORINEF ?Take 2 tablets (0.2 mg total) by mouth 2 (two) times daily. ?  ?midodrine 10 MG tablet ?Commonly known as: PROAMATINE ?Take 1 tablet (10 mg) at 7 AM, 11 AM, and 3 PM ?What changed:  ?how much to take ?how to take this ?when to take this ?additional instructions ?  ?PARoxetine 30  MG tablet ?Commonly known as: PAXIL ?Take 1 tablet (30 mg total) by mouth daily. ?  ?polyethylene glycol 17 g packet ?Commonly known as: MIRALAX / GLYCOLAX ?Take 34 g by mouth 2 (two) times daily. ?  ?potassium chloride 10 MEQ tablet ?Commonly known as: KLOR-CON ?Take 3 tablets (30 mEq total) by mouth daily. ?What changed:  ?how much to take ?when to take this ?  ?rivastigmine 9.5 mg/24hr ?Commonly known as: EXELON ?Place 9.5 mg onto the skin daily. ?  ?senna 8.6 MG Tabs tablet ?Commonly known as: SENOKOT ?Take 1 tablet by mouth in the morning and at bedtime. ?  ?SUPER B COMPLEX PO ?Take 1 tablet by mouth daily. ?  ? ?  ? ?  ?  ? ? ?  ?Discharge Care Instructions  ?(From admission, onward)  ?  ? ? ?  ? ?  Start     Ordered  ? 06/12/21 0000  Discharge wound care:       ?Comments: Okay to leave incisions open to air  ? 06/12/21 0913  ? 06/12/21 0000  No dressing needed       ? 06/12/21 0913  ? 06/12/21 0000  Discharge wound care:       ?Comments: Incisional and dressing care: Okay to leave incisions open to air ?Showering: Okay to begin showering getting incisions wet  ? 06/12/21 0913  ? ?  ?  ? ?  ? ? ?  Disposition and follow-up:   ?Stephen Gentry was discharged from [facility] in Fair condition.  At the hospital follow up visit please address: ? ?1.  Left intertrochanteric fracture proximal left femur status post intramedullary nail ?Patient will need follow-up with surgery in 2 weeks.  This was arranged prior to discharge.  Patient worked with physical therapy and Occupational Therapy while in hospital and was able to stand with maximum assistance. ?This was a fragility fracture.  Please consider having patient do DEXA scan and consider starting bisphosphonates if appropriate. ? ?2. Parkinsons disease ?Dysphagia and worsening mentation noted in hospital. Likely progression of Parkinson's disease. Referral placed to palliative medicine as outpatient. ? ?3.  Dysphagia ?Patient found to have new  dysphagia likely progression of Parkinson's disease.  He had a modified barium swallow here and it showed mild oral dysphagia and mild to moderate pharyngeal dysphagia.  Speech recommended regular diet with honey thick liquids. ? ?4. Constipation ?Patient has long history of constipation from parkinsons disease. Home bowel regimen was continued. ? ?Labs / imaging needed at time of follow-up: CBC ? ?Pending labs/ test needing follow-up: None ? ?Follow-up Appointments: ? Follow-up Information   ? ? Haddix, Gillie Manners, MD. Schedule an appointment as soon as possible for a visit in 2 week(s).   ?Specialty: Orthopedic Surgery ?Why: FOR WOUND CHECK AND REPEAT X-RAYS ?Contact information: ?[address] ?[place] [address] ?[phone] ? ? ?  ?  ? ?  ?  ? ?  ? ? ?Hospital Course by problem list: ?#Left Intertrochanteric Fracture of Proximal L Femur S/P Unwitnessed Fall ?#Fragility fracture ?Patient had unwitnessed fall at his memory care unit while on anticoagulation. Orthopedic surgery was consulted in the ED and patient underwent intramedullary nailing of the left intertrochanteric fracture on the morning of 3/31. He has no history of stroke, diabetes, heart failure, or MI. RCRI calculated to be class I risk with 3.9% 30-day risk of MI, death, or cardiac arrest. Surgery overall went well and patient is to be weight bearing as tolerated. PT/OT worked with the patient and are recommending rehab.   ? ?#Acute blood loss anemia ?Hb 12.7 about 6 months ago and was 9.6 on the morning of 3/31. Suspect this drop in Hb is in the setting of blood loss after the patient's fall and hip fracture.  Hemoglobin remained stable throughout rest of hospital admission. ? ?#Paroxysmal atrial fibrillation/flutter on eliquis ?Patient is on eliquis 5 mg bid and presented after an unwitnessed fall. Holding eliquis in the setting of the patient's surgery. Resumed after discussing risk and benefits with the patient's wife.  She would like  patient to restart Eliquis.  ? ?#Parkinson's disease ?#Dementia ?#Oral and pharyngeal dysphagia ?Patient diagnosed with Parkinson's in 2014 and currently is on rivastigmine 9.5 mg daily patch, Sinemet 25-100 g, Paxil 30 mg, and mirtazapine 30 mg. 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 At baseline, patient is alert and oriented to self, does not know place or year.  He was noted to have new dysphagia.  Modified barium swallow was completed and showed mild oral dysphagia and mild to moderate pharyngeal dysphagia.  Speech worked with patient and thought that this was likely progression of Parkinson's disease.  Patient was sleepy following surgery, his wife stated that she had noticed that he is taking more naps over the last 2 months.  Talked with patient's wife that this  is possibly related to Parkinson's disease. Mirtazapine discontinued, other home medications were continued. With progression of disease, will place order for referral for outpatient palliative medicine. Talked with his wife about this and she was ok with seeing them. ? ?#Orthostatic hypotension ?On midodrine 10 mg tid and florinef 0.1 mg twice daily (does not have any adrenal disease). Continued home meds. ?  ?#Constipation ?Continue home bowel regimen. Long standing. ? ?Subjective: ?Patient assessed at bedside this AM. He states that he is not doing well this morning, but is not able to identify anything in particular that is bothering him. He endorses from pain in his belly. Nursing reports he did have a bowel movement overnight. No other concerns at this time. ? ?Discharge Exam:   ?BP (!) 141/74 (BP Location: Left Arm)   Pulse 80   Temp (!) 97.5 ?F (36.4 ?C) (Oral)   Resp 18   Ht 6' (1.829 m)   Wt 67.3 kg   SpO2 98%   BMI 20.12 kg/m?  ?Discharge exam:  ?Physical Exam ?Constitutional:   ?   Comments: answers questions appropriately, oriented to person, but not  place or time, laying in bed with eyes open ?  ?HENT:  ?   Head: Normocephalic.  ?   Mouth/Throat:  ?   Mouth: Mucous membranes are dry.  ?   Pharynx: Oropharynx is clear.  ?Eyes:  ?   Conjunctiva/sclera: Conjunctiv

## 2021-06-11 NOTE — Progress Notes (Signed)
Speech Language Pathology Treatment: Dysphagia  ?Patient Details ?Name: Stephen Gentry ?MRN: [id number] ?DOB: 1934/08/02 ?Today's Date: 06/11/2021 ?Time: 6301-6010 ?SLP Time Calculation (min) (ACUTE ONLY): 16 min ? ?Assessment / Plan / Recommendation ?Clinical Impression ? Pt assisted with lunch meal with granddaughter, who is an Therapist, sports at [employer], at bedside with salmon, carrots, mac n cheese and honey thick juice. Pt is more alert today and interactive. There was once cough during session after salmon. Mastication, bolus prep and transfer is prolonged. He swallows twice after most bites if given additional time to reduce pharyngeal residue seen during yesterday's MBS. Straw sips honey consumed with verbal cues to take small sips. Granddaughter states he appears to be swallowing similar to how he does at home. Vocal quality wet x 1. Reviewed MBS results verbally with granddaughter. Continue ST. ?  ?HPI HPI: Pt admitted after a fall at a memory care unit, Sustained left hip fracture and underwent IM nail on 06/07/21.  PMH significant for but not limited to: Parkinsons, dementia, falls, anemia, PAF, orthostatic hypotension. MD notes states wife and RN note some difficulty swallowing solids ?  ?   ?SLP Plan ? Continue with current plan of care ? ?  ?  ?Recommendations for follow up therapy are one component of a multi-disciplinary discharge planning process, led by the attending physician.  Recommendations may be updated based on patient status, additional functional criteria and insurance authorization. ?  ? ?Recommendations  ?Diet recommendations: Regular;Honey-thick liquid ?Liquids provided via: Cup;Teaspoon;Straw ?Medication Administration: Crushed with puree ?Supervision: Full supervision/cueing for compensatory strategies ?Compensations: Minimize environmental distractions;Slow rate;Small sips/bites ?Postural Changes and/or Swallow Maneuvers: Seated upright 90 degrees  ?   ?    ?   ? ? ? ? Oral Care Recommendations: Oral care  BID ?Follow Up Recommendations: Skilled nursing-short term rehab (<3 hours/day) ?Assistance recommended at discharge: Frequent or constant Supervision/Assistance ?SLP Visit Diagnosis: Dysphagia, oropharyngeal phase (R13.12) ?Plan: Continue with current plan of care ? ? ? ? ?  ?  ? ? ?[name] ? ?06/11/2021, 12:20 PM ?

## 2021-06-11 NOTE — Progress Notes (Signed)
Occupational Therapy Treatment ?Patient Details ?Name: Stephen Gentry ?MRN: [id number] ?DOB: 1934/09/10 ?Today's Date: 06/11/2021 ? ? ?History of present illness 87 y.o. male presents to [facility] hospital on 06/06/2021 after experiencing a fall while at [facility]. Pt found to have a displaced and comminuted intertrochanteric L femur fx. Pt underwent IM nailing of L femur on 06/07/2021. PMH includes Parkinson's, dementia, hypertension, atrial flutter/fibrillation (on Eliquis). ?  ?OT comments ? Pt making slow progress towards goals this session, spouse present. Pt max A for ADLs at bed level, as pt requiring min-mod A for sitting balance. Pt max A +2 for bed mobility, use of helicopter method to assist pt to EOB. Pt mod A +2 for sit to stand transfer with use of Stedy (backwards) to pull from. Pt able to stand x2 with trunk flexed, difficulty looking upward. Pt presenting with impairments listed below, will follow acutely. Continue to recommend SNF at d/c.  ? ?Recommendations for follow up therapy are one component of a multi-disciplinary discharge planning process, led by the attending physician.  Recommendations may be updated based on patient status, additional functional criteria and insurance authorization. ?   ?Follow Up Recommendations ? Skilled nursing-short term rehab (<3 hours/day)  ?  ?Assistance Recommended at Discharge Frequent or constant Supervision/Assistance  ?Patient can return home with the following ? Two people to help with walking and/or transfers;Two people to help with bathing/dressing/bathroom;Assistance with feeding ?  ?Equipment Recommendations ? Wheelchair (measurements OT);Wheelchair cushion (measurements OT)  ?  ?Recommendations for Other Services   ? ?  ?Precautions / Restrictions Precautions ?Precautions: Fall ?Restrictions ?Weight Bearing Restrictions: No ?LLE Weight Bearing: Weight bearing as tolerated  ? ? ?  ? ?Mobility Bed Mobility ?Overal bed mobility: Needs Assistance ?  ?  ?   ?Supine to sit: +2 for physical assistance, Max assist ?Sit to supine: Max assist, +2 for physical assistance ?  ?General bed mobility comments: use of pad and helicopter method ?  ? ?Transfers ?Overall transfer level: Needs assistance ?Equipment used:  (Stedy (backwards to pull up from)) ?Transfers: Sit to/from Stand ?Sit to Stand: Mod assist, +2 physical assistance, From elevated surface ?  ?  ?  ?  ?  ?  ?  ?  ?Balance Overall balance assessment: Needs assistance, History of Falls ?Sitting-balance support: Feet supported ?Sitting balance-Leahy Scale: Poor ?  ?  ?Standing balance support: Bilateral upper extremity supported, During functional activity, Reliant on assistive device for balance ?Standing balance-Leahy Scale: Poor ?Standing balance comment: reliant on external support ?  ?  ?  ?  ?  ?  ?  ?  ?  ?  ?  ?   ? ?ADL either performed or assessed with clinical judgement  ? ?ADL Overall ADL's : Needs assistance/impaired ?  ?  ?  ?  ?  ?  ?  ?  ?Upper Body Dressing : Maximal assistance;Bed level ?Upper Body Dressing Details (indicate cue type and reason): to don gown ?  ?  ?  ?  ?  ?  ?  ?  ?Functional mobility during ADLs: Maximal assistance ?  ?  ? ?Extremity/Trunk Assessment Upper Extremity Assessment ?Upper Extremity Assessment: Generalized weakness ?  ?Lower Extremity Assessment ?Lower Extremity Assessment: Defer to PT evaluation ?  ?  ?  ? ?Vision   ?Vision Assessment?: No apparent visual deficits ?Additional Comments: pt communicative, but keeps eyes closed frequently during session ?  ?Perception Perception ?Perception: Not tested ?  ?Praxis Praxis ?Praxis: Not  tested ?  ? ?Cognition Arousal/Alertness: Lethargic ?Behavior During Therapy: Flat affect ?Overall Cognitive Status: History of cognitive impairments - at baseline ?  ?  ?  ?  ?  ?  ?  ?  ?  ?  ?  ?  ?  ?  ?  ?  ?General Comments: Pt has advanced Parkinson's and dementia ?  ?  ?   ?Exercises   ? ?  ?Shoulder Instructions   ? ? ?  ?General  Comments pt with eyes closed frequently during session, VSS on RA  ? ? ?Pertinent Vitals/ Pain       Pain Assessment ?Pain Assessment: Faces ?Pain Score: 3  ?Faces Pain Scale: Hurts a little bit ?Pain Location: generalized, states "all over" ?Pain Descriptors / Indicators: Discomfort, Constant ?Pain Intervention(s): Limited activity within patient's tolerance, Monitored during session, Repositioned ? ?Home Living   ?  ?  ?  ?  ?  ?  ?  ?  ?  ?  ?  ?  ?  ?  ?  ?  ?  ?  ? ?  ?Prior Functioning/Environment    ?  ?  ?  ?   ? ?Frequency ? Min 2X/week  ? ? ? ? ?  ?Progress Toward Goals ? ?OT Goals(current goals can now be found in the care plan section) ? Progress towards OT goals: Progressing toward goals ? ?Acute Rehab OT Goals ?Patient Stated Goal: none stated ?OT Goal Formulation: With patient ?Time For Goal Achievement: 06/22/21 ?Potential to Achieve Goals: Fair ?ADL Goals ?Pt Will Perform Eating: with set-up;sitting ?Pt Will Perform Grooming: with set-up;sitting ?Pt Will Perform Upper Body Bathing: with min assist;sitting ?Pt Will Perform Lower Body Bathing: with mod assist;sitting/lateral leans ?Pt Will Perform Upper Body Dressing: with min assist;sitting ?Pt Will Perform Lower Body Dressing: with mod assist;sit to/from stand;sitting/lateral leans ?Pt Will Transfer to Toilet: with mod assist;stand pivot transfer ?Pt Will Perform Toileting - Clothing Manipulation and hygiene: with mod assist;sitting/lateral leans  ?Plan Discharge plan remains appropriate;Frequency remains appropriate   ? ?Co-evaluation ? ? ?   ?  ?  ?  ?  ? ?  ?AM-PAC OT "6 Clicks" Daily Activity     ?Outcome Measure ? ? Help from another person eating meals?: A Lot ?Help from another person taking care of personal grooming?: A Lot ?Help from another person toileting, which includes using toliet, bedpan, or urinal?: Total ?Help from another person bathing (including washing, rinsing, drying)?: Total ?Help from another person to put on and taking off  regular upper body clothing?: A Lot ?Help from another person to put on and taking off regular lower body clothing?: Total ?6 Click Score: 9 ? ?  ?End of Session Equipment Utilized During Treatment: Gait belt;Rolling walker (2 wheels);Other (comment) (stedy) ? ?OT Visit Diagnosis: Unsteadiness on feet (R26.81);Other abnormalities of gait and mobility (R26.89);Muscle weakness (generalized) (M62.81);Pain ?Pain - Right/Left: Left ?Pain - part of body: Hip ?  ?Activity Tolerance Patient tolerated treatment well ?  ?Patient Left in bed;with call bell/phone within reach;with bed alarm set;with family/visitor present ?  ?Nurse Communication Mobility status ?  ? ?   ? ?Time: 7782-4235 ?OT Time Calculation (min): 22 min ? ?Charges: OT General Charges ?$OT Visit: 1 Visit ?OT Treatments ?$Self Care/Home Management : 8-22 mins ? ?Alfonzo Beers, OTD, OTR/L ?Acute Rehab ?[phone] ? ? ?Mayer Masker ?06/11/2021, 4:20 PM ?

## 2021-06-12 NOTE — Progress Notes (Signed)
[facility] Hospital Liaison note: ° °This is a pending outpatient-based Palliative Care patient. Will continue to follow for disposition. ° °Please call with any outpatient palliative questions or concerns. ° °Thank you, °Dee Curry, LPN °[facility] Hospital Liaison °[phone] °

## 2021-06-12 NOTE — TOC Transition Note (Signed)
Transition of Care (TOC) - CM/SW Discharge Note ? ? ?Patient Details  ?Name: Stephen Gentry ?MRN: [id number] ?Date of Birth: November 20, 1934 ? ?Transition of Care [facility]) CM/SW Contact:  ?Lorri Frederick, LCSW ?Phone Number: ?06/12/2021, 10:13 AM ? ? ?Clinical Narrative:   Pt discharging to [facility].  RN call [phone] for report.  ? ? ? ?Final next level of care: Skilled Nursing Facility ?Barriers to Discharge: Barriers Resolved ? ? ?Patient Goals and CMS Choice ?Patient states their goals for this hospitalization and ongoing recovery are:: Spouse would like patient to [facility] ?  ?  ? ?Discharge Placement ?  ?           ?Patient chooses bed at:  [facility]) ?Patient to be transferred to facility by: [facility] ?Name of family member notified: wife [name] ?Patient and family notified of of transfer: 06/12/21 ? ?Discharge Plan and Services ?In-house Referral: Clinical Social Work ?  ?           ?  ?  ?  ?  ?  ?  ?  ?  ?  ?  ? ?Social Determinants of Health (SDOH) Interventions ?  ? ? ?Readmission Risk Interventions ?   ? View : No data to display.  ?  ?  ?  ? ? ? ? ? ?

## 2021-06-12 NOTE — Progress Notes (Signed)
Speech Language Pathology Treatment: Dysphagia  ?Patient Details ?Name: Stephen Gentry ?MRN: [id number] ?DOB: 05/08/1934 ?Today's Date: 06/12/2021 ?Time: 4098-1191 ?SLP Time Calculation (min) (ACUTE ONLY): 20 min ? ?Assessment / Plan / Recommendation ?Clinical Impression ? Patient seen by SLP for skilled treatment focused on dysphagia goals and wife education. Patient awake and alert and told SLP that his mouth felt dry. SLP performed oral care and removed trace amount of likely PO residuals from earlier meal but otherwise, patient's oral mucosa was moist and clean. SLP then obseved patient with toleration of small ice chips (total of 5). He was able to exhibit a much chewing and did initiate swallow (suspect delayed).He did exhibit a mild amount of delayed throat clearing but overall he tolerated ice chips well. When his wife entered room, SLP discussed allowing small amounts of ice chips to help with keeping his mouth moist and for pleasure. Patient then consumed a couple straw sips of honey thick liquids that wife provided. Plan is for him to discharge to [facility] today and so SLP is recommending continued services at next venue of care. ? ?  ?HPI HPI: Pt admitted after a fall at a memory care unit, Sustained left hip fracture and underwent IM nail on 06/07/21.  PMH significant for but not limited to: Parkinsons, dementia, falls, anemia, PAF, orthostatic hypotension. MD notes states wife and RN note some difficulty swallowing solids ?  ?   ?SLP Plan ? Discharge SLP treatment due to (comment) (patient discharge to SNF) ? ?  ?  ?Recommendations for follow up therapy are one component of a multi-disciplinary discharge planning process, led by the attending physician.  Recommendations may be updated based on patient status, additional functional criteria and insurance authorization. ?  ? ?Recommendations  ?Diet recommendations: Dysphagia 1 (puree);Honey-thick liquid ?Liquids provided via: Cup;Straw;Teaspoon ?Medication  Administration: Crushed with puree ?Supervision: Full supervision/cueing for compensatory strategies ?Compensations: Minimize environmental distractions;Slow rate;Small sips/bites ?Postural Changes and/or Swallow Maneuvers: Seated upright 90 degrees  ?   ?    ?   ? ? ? ? Oral Care Recommendations: Oral care BID ?Follow Up Recommendations: Skilled nursing-short term rehab (<3 hours/day) ?Assistance recommended at discharge: Frequent or constant Supervision/Assistance ?SLP Visit Diagnosis: Dysphagia, oropharyngeal phase (R13.12) ?Plan: Discharge SLP treatment due to (comment) (patient discharge to SNF) ? ? ? ? ?  ?  ? ?Angela Nevin, MA, CCC-SLP ?Speech Therapy ? ?

## 2021-06-13 ENCOUNTER — Non-Acute Institutional Stay (SKILLED_NURSING_FACILITY): Payer: Medicare Other | Admitting: Adult Health

## 2021-06-13 ENCOUNTER — Encounter: Payer: Self-pay | Admitting: Adult Health

## 2021-06-13 DIAGNOSIS — I951 Orthostatic hypotension: Secondary | ICD-10-CM

## 2021-06-13 DIAGNOSIS — D62 Acute posthemorrhagic anemia: Secondary | ICD-10-CM | POA: Diagnosis not present

## 2021-06-13 DIAGNOSIS — G20 Parkinson's disease: Secondary | ICD-10-CM

## 2021-06-13 DIAGNOSIS — S72142S Displaced intertrochanteric fracture of left femur, sequela: Secondary | ICD-10-CM | POA: Diagnosis not present

## 2021-06-13 DIAGNOSIS — I480 Paroxysmal atrial fibrillation: Secondary | ICD-10-CM

## 2021-06-13 DIAGNOSIS — R1312 Dysphagia, oropharyngeal phase: Secondary | ICD-10-CM

## 2021-06-13 DIAGNOSIS — F02C0 Dementia in other diseases classified elsewhere, severe, without behavioral disturbance, psychotic disturbance, mood disturbance, and anxiety: Secondary | ICD-10-CM
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
## 2021-06-17 ENCOUNTER — Non-Acute Institutional Stay (SKILLED_NURSING_FACILITY): Payer: Medicare Other | Admitting: Internal Medicine

## 2021-06-17 ENCOUNTER — Encounter: Payer: Self-pay | Admitting: Internal Medicine

## 2021-06-17 DIAGNOSIS — I951 Orthostatic hypotension: Secondary | ICD-10-CM | POA: Diagnosis not present

## 2021-06-17 DIAGNOSIS — G20 Parkinson's disease: Secondary | ICD-10-CM

## 2021-06-17 DIAGNOSIS — S72145G Nondisplaced intertrochanteric fracture of left femur, subsequent encounter for closed fracture with delayed healing: Secondary | ICD-10-CM | POA: Diagnosis not present

## 2021-06-17 DIAGNOSIS — F0280 Dementia in other diseases classified elsewhere without behavioral disturbance: Secondary | ICD-10-CM

## 2021-06-17 DIAGNOSIS — D62 Acute posthemorrhagic anemia: Secondary | ICD-10-CM

## 2021-06-17 DIAGNOSIS — E43 Unspecified severe protein-calorie malnutrition: Secondary | ICD-10-CM | POA: Diagnosis not present

## 2021-06-17 NOTE — Assessment & Plan Note (Addendum)
No current albumin or total protein in record.  He exhibits temporal wasting, sunken eyes, visible ribs, and interosseous wasting. ?Nutrition consult @ SNF. ?

## 2021-06-17 NOTE — Progress Notes (Signed)
? ?NURSING HOME LOCATION:  [facility] ?ROOM NUMBER:  [id number] A ? ?CODE STATUS:  DNR ? ?PCP:  Pollie Friar MD ? ?This is a comprehensive admission note to this SNFperformed on this date less than 30 days from date of admission. ?Included are preadmission medical/surgical history; reconciled medication list; family history; social history and comprehensive review of systems.  ?Corrections and additions to the records were documented. Comprehensive physical exam was also performed. Additionally a clinical summary was entered for each active diagnosis pertinent to this admission in the Problem List to enhance continuity of care. ? ?HPI: He was hospitalized 3/30 - 06/12/2021 with left intertrochanteric femur fragility fracture sustained in an unwitnessed fall at the memory care unit where he resides.  This is in the context of history of orthostatic hypotension for which he is prescribed midodrine 10 mg 3 times daily and Florinef 0.1 mg twice daily.  There is no history of adrenal insufficiency. ?Intramedullary nailing of the left proximal femur was completed 3/31 by Dr Jena Gauss. ?Hemoglobin was 12.6 pre op;hemoglobin was 9.6 on the morning of 3/31.  This is in the context of Eliquis for PAF.  Eliquis was held perioperatively but subsequently restarted after discussion of risk versus benefit with his wife.  ?Course was also complicated by dysphagia most likely related to progressive Parkinson's disease.  Mild oral dysphagia and mild-moderate pharyngeal dysphagia were documented.  Speech Therapy recommended regular diet with honey thick liquids. ?On 4/3 creatinine was 0.76 with GFR greater than 60.  H/H was 8.0/25.5. ?PT/OT recommended SNF placement for rehab due to debilitation. ? ?Past medical and surgical history: Includes chronic anemia, history of diverticulosis, peripheral neuropathy, history of orthostatic hypotension, PAF, Parkinson's, and "Alzheimer's dementia". ?Surgeries and procedures  include colonoscopy, hand surgery, and laparoscopic partial colectomy. ? ?Social history: Currently nondrinker; former smoker. ? ?Family history: Noncontributory due to advanced age. ?  ?Review of systems: Clinical neurocognitive deficits prevented ROS completion. He states he was hospitalized "for my heart".  He denied having any pain and was unaware of having had surgery.  He could not provide the date, even the year. ? ?Physical exam:  ?Pertinent or positive findings: He appears his age and suboptimally nourished. Pallor is present clinically. He was initially asleep but could be aroused.  He was exhibiting hypopnea without snoring or apnea.  He was wearing nasal oxygen.  He has a beard and mustache.  Eyes are sunken and temporal wasting is present.Intraoral exam was limited.  Ribs are visible anteriorly.  Heart sounds are distant.  Breath sounds are decreased.  Pedal pulses are not palpable.  He has interosseous wasting of the hands.  He has an intermittent to and fro laryngeal tremor as well as a constant rotational tremor of the right upper extremity.  The left hip is surgically dressed. ? ?General appearance: no acute distress, increased work of breathing is present.   ?Lymphatic: No lymphadenopathy about the head, neck, axilla. ?Eyes: No conjunctival inflammation or lid edema is present. There is no scleral icterus. ?Ears:  External ear exam shows no significant lesions or deformities. Nose:  External nasal examination shows no deformity or inflammation. Nasal mucosa are pink and moist without lesions, exudates ?Neck:  No thyromegaly, masses, tenderness noted.    ?Heart:  No gallop, murmur, click, rub.  ?Lungs: without wheezes, rhonchi, rales, rubs. ?Abdomen: Bowel sounds are normal.  Abdomen is soft and nontender with no organomegaly, hernias, masses. ?GU: Deferred  ?Extremities:  No cyanosis, clubbing, edema. ?  Neurologic exam:  Balance, Rhomberg, finger to nose testing could not be completed due to  clinical state ?Skin: Warm & dry w/o tenting. ?No significant lesions or rash. ? ?See clinical summary under each active problem in the Problem List with associated updated therapeutic plan ? ?

## 2021-06-17 NOTE — Assessment & Plan Note (Signed)
He did not realize he had fallen and fractured his hip.  He denies any pain.  He could not provide the date, even the year. ?

## 2021-06-17 NOTE — Assessment & Plan Note (Addendum)
Today's blood pressure of 83/48 was taken prior to administration of midodrine. Serial BPs 111/60-119/62. Recheck post med administration. ?Check for contribution of progressive anemia. ?

## 2021-06-17 NOTE — Patient Instructions (Signed)
See assessment and plan under each diagnosis in the problem list and acutely for this visit 

## 2021-06-17 NOTE — Assessment & Plan Note (Addendum)
DEXA to be considered as apparent fragility fracture clinically.  PT/OT at SNF as tolerated. ?

## 2021-06-17 NOTE — Assessment & Plan Note (Signed)
Post left femur or IM nailing H/H 8/25.5.  Iron supplement will be ordered and H/H monitored. ?

## 2021-06-21 LAB — BASIC METABOLIC PANEL
BUN: 22 — AB (ref 4–21)
CO2: 29 — AB (ref 13–22)
Chloride: 107 (ref 99–108)
Creatinine: 0.5 — AB (ref 0.6–1.3)
Glucose: 96
Potassium: 3.7 mEq/L (ref 3.5–5.1)
Sodium: 145 (ref 137–147)

## 2021-06-21 LAB — CBC AND DIFFERENTIAL
HCT: 30 — AB (ref 41–53)
Hemoglobin: 9.8 — AB (ref 13.5–17.5)
Platelets: 263 10*3/uL (ref 150–400)
WBC: 5.1

## 2021-06-21 LAB — CBC: RBC: 3.31 — AB (ref 3.87–5.11)

## 2021-06-21 LAB — COMPREHENSIVE METABOLIC PANEL: Calcium: 8.9 (ref 8.7–10.7)

## 2021-06-24 ENCOUNTER — Non-Acute Institutional Stay (SKILLED_NURSING_FACILITY): Payer: Medicare Other | Admitting: Adult Health

## 2021-06-24 ENCOUNTER — Encounter: Payer: Self-pay | Admitting: Adult Health

## 2021-06-24 DIAGNOSIS — S72142S Displaced intertrochanteric fracture of left femur, sequela: Secondary | ICD-10-CM | POA: Diagnosis not present

## 2021-06-24 DIAGNOSIS — I951 Orthostatic hypotension: Secondary | ICD-10-CM | POA: Diagnosis not present

## 2021-06-24 DIAGNOSIS — I480 Paroxysmal atrial fibrillation: Secondary | ICD-10-CM

## 2021-06-24 DIAGNOSIS — F0280 Dementia in other diseases classified elsewhere without behavioral disturbance: Secondary | ICD-10-CM

## 2021-06-24 DIAGNOSIS — G20 Parkinson's disease: Secondary | ICD-10-CM
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
## 2021-06-25 ENCOUNTER — Non-Acute Institutional Stay: Payer: Medicare Other | Admitting: Hospice

## 2021-06-25 DIAGNOSIS — F0280 Dementia in other diseases classified elsewhere without behavioral disturbance: Secondary | ICD-10-CM

## 2021-06-25 DIAGNOSIS — K5901 Slow transit constipation: Secondary | ICD-10-CM

## 2021-06-25 DIAGNOSIS — R531 Weakness: Secondary | ICD-10-CM

## 2021-06-25 DIAGNOSIS — S72142G Displaced intertrochanteric fracture of left femur, subsequent encounter for closed fracture with delayed healing: Secondary | ICD-10-CM

## 2021-06-25 DIAGNOSIS — Z515 Encounter for palliative care: Secondary | ICD-10-CM

## 2021-06-25 NOTE — Progress Notes (Signed)
? ? ?Manufacturing engineer ?Community Palliative Care Consult Note ?Telephone: [phone]  ?Fax: [phone] ? ?PATIENT NAME: Stephen Gentry ?[place] [address] ?[id number] (home)  ?DOB: 1934-11-14 ?MRN: [id number] ? ?PRIMARY CARE PROVIDER:    ?Clovia Cuff, MD,  ?[address] ?[place] [address] ?[phone] ? ?REFERRING PROVIDER:   ?Dr. Keturah Barre. Hopper ? ?RESPONSIBLE PARTY:    ?Contact Information   ? ? Name Relation Home Work Mobile  ? Grady Spouse [phone]  [phone]  ? ?  ? ? ? ?I met face to face with patient at facility. Visit to build trust and highlight Palliative Medicine as specialized medical care for people living with serious illness, aimed at facilitating better quality of life through symptoms relief, assisting with advance care planning and complex medical decision making.  NP called spouse and left her a voicemail with call back number. ?ASSESSMENT AND / RECOMMENDATIONS:  ? ?Advance Care Planning: Our advance care planning conversation included a discussion about:    ?The value and importance of advance care planning  ?Difference between Hospice and Palliative care ?Exploration of goals of care in the event of a sudden injury or illness  ?Identification and preparation of a healthcare agent  ?Review and updating or creation of an  advance directive document . ?Decision not to resuscitate or to de-escalate disease focused treatments due to poor prognosis. ? ?CODE STATUS: Patient is a DO NOT RESUSCITATE. Signed DNR uploaded to Epic. ? ?Goals of Care: Goals include to maximize quality of life and symptom management ? ?I spent 16 minutes providing this initial consultation. More than 50% of the time in this consultation was spent on counseling patient and coordinating communication. ?-------------------------------------------------------------------------------------------------------------------------------------- ? ?Symptom  Management/Plan: ?Weakness: acute on chronic related to recent hospitalization for left hip fracture s/p Intramedullary nailing of the left proximal femur  ?Now weight bearing, PT/OT is ongoing.  ?Left hip fracture:s/p Intramedullary nailing. Wound site healed. Continue PT/OT for strengthening.  ?Dementia: associated with hx of Parkinson disease.  FAST 6D. Continue Rivastigmine and Sinemet as ordered.  Optimize ongoing supportive care neurologist consult as planned.  ?Constipation: Managed with MiraLAX.  ?Routine CBC CMP. ?Follow up: Palliative care will continue to follow for complex medical decision making, advance care planning, and clarification of goals. Return 6 weeks or prn. Encouraged to call provider sooner with any concerns.  ? ?Family /Caregiver/Community Supports: Patient in SNF for acute rehab ? ?HOSPICE ELIGIBILITY/DIAGNOSIS: TBD ? ?Chief Complaint: Initial Palliative care visit ? ?HISTORY OF PRESENT ILLNESS:  Stephen Gentry is a 39 y.o. year old male  with multiple morbidities requiring close monitoring and with high risk of complications and mortality: Dementia associated with Parkinson disease, anemia protein caloric malnutrition, orthostatic hypotension, left hip fracture. He was hospitalized 3/30 - 06/12/2021 with left intertrochanteric femur fragility fracture sustained in an unwitnessed fall. Intramedullary nailing of the left proximal femur was completed 3/31. Patient in no acute distress, denied pain/discomfort, endorses weakness and difficulty getting around.   ?History obtained from review of EMR, discussion with primary team, caregiver, family and/or Mr. Grand.  ?Review and summarization of Epic records shows history from other than patient. Rest of 10 point ROS asked and negative. Independent interpretation of tests and reviewed as needed, available labs, patient records, imaging, studies and related documents from the EMR. ? ?Recent Labs  ?Lab 06/21/21 ?0000  ?WBC 5.1  ?HGB 9.8*  ?HCT 30*   ?PLT 263  ? ?Recent Labs  ?Lab 06/21/21 ?0000  ?NA  145  ?K 3.7  ?CL 107  ?CO2 29*  ?BUN 22*  ?CREATININE 0.5*  ? ? ?Physical Exam: ?Height/Weight: 6 feet 2 inches/141.6 Ib ?Constitutional: NAD ?General: Well groomed, cooperative ?EYES: anicteric sclera, lids intact, no discharge  ?ENMT: Moist mucous membrane ?CV: S1 S2, RRR, no LE edema ?Pulmonary: LCTA, no increased work of breathing, no cough, ?Abdomen: active BS + 4 quadrants, soft and non tender ?GU: no suprapubic tenderness ?MSK: weakness, sarcopenia, limited ROM ?Skin: warm and dry, no rashes or wounds on visible skin ?Neuro:  weakness, otherwise non focal ?Psych: non-anxious affect ?Hem/lymph/immuno: no widespread bruising ? ? ?PAST MEDICAL HISTORY:  ?Active Ambulatory Problems  ?  Diagnosis Date Noted  ? Idiopathic Parkinson's disease ([place]) 02/07/2014  ? Mild depression 08/18/2014  ? Disturbed cognition 08/18/2014  ? Snoring 08/18/2014  ? Dementia associated with Parkinson's disease ([place]) 02/07/2014  ? Vitamin deficiency 02/19/2015  ? Memory loss 02/19/2015  ? Vitamin D deficiency 02/19/2015  ? Other fatigue 02/19/2015  ? 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 Anxiety 04/24/2016  ? Constipation 09/25/2016  ? Orthostatic hypotension 01/22/2018  ? Atrial flutter ([place]) 01/23/2018  ? Sigmoid volvulus ([place]) 04/26/2018  ? Weight loss 05/17/2018  ? Chronic anticoagulation 05/17/2018  ? Closed intertrochanteric fracture of left hip ([place])   ? Protein-calorie malnutrition, severe 06/10/2021  ? Acute blood loss as cause of postoperative anemia 06/17/2021  ? ?Resolved Ambulatory Problems  ?  Diagnosis Date Noted  ? Hip fracture ([place]) 06/06/2021  ? ?Past Medical History:  ?Diagnosis Date  ? Alzheimer's dementia ([place])   ? Anemia   ? Anticoagulant long-term use   ? Arthritis   ? Autonomic dysfunction   ? Baker's cyst   ? Chronic constipation   ? Diverticulosis   ? Internal hemorrhoids   ? Nocturia   ? Numbness and tingling in right hand   ? Numbness and  tingling of both feet   ? PAF (paroxysmal atrial fibrillation) ([place])   ? Parkinson's disease ([place])   ? Tremor, essential   ? Wears glasses   ? ? ?SOCIAL HX:  ?Social History  ? ?Tobacco Use  ? Smoking status: Former  ?  Years: 3.00  ?  Types: Cigarettes  ?  Quit date: 10/23/1956  ?  Years since quitting: 64.7  ? Smokeless tobacco: Never  ?Substance Use Topics  ? Alcohol use: Not Currently  ?  Alcohol/week: 0.0 standard drinks  ? ?  ?FAMILY HX:  ?Family History  ?Problem Relation Age of Onset  ? Lung cancer Mother   ? Lung cancer Father   ? Colon cancer Neg Hx   ? Esophageal cancer Neg Hx   ? Pancreatic cancer Neg Hx   ? Stomach cancer Neg Hx   ? Liver disease Neg Hx   ? Rectal cancer Neg Hx   ?   ? ?ALLERGIES:  ?Allergies  ?Allergen Reactions  ? Lorazepam Other (See Comments)  ?  Combative, foul language. ?Wife doesn't want patient to have again  ? Tetracyclines & Related Anaphylaxis  ? Donepezil Other (See Comments)  ?  Didn't like  ? Amitiza [Lubiprostone] Nausea Only  ?   ? ?PERTINENT MEDICATIONS:  ?Outpatient Encounter Medications as of 06/25/2021  ?Medication Sig  ? acetaminophen (TYLENOL) 325 MG tablet Take 2 tablets (650 mg total) by mouth every 6 (six) hours as needed for mild pain, fever or headache.  ? apixaban (ELIQUIS) 5 MG TABS tablet Take 1 tablet (5 mg total) by mouth  2 (two) times daily.  ? B Complex-C (SUPER B COMPLEX PO) Take 1 tablet by mouth daily.  ? bisacodyl (DULCOLAX) 10 MG suppository f not relieved by MOM, give 10 mg Bisacodyl suppositiory rectally X 1 dose in 24 hours as needed  ? Carbidopa-Levodopa ER (SINEMET CR) 25-100 MG tablet controlled release Take 2 tablets by mouth 3 (three) times daily. 2 tabs at 6 am, 11 am, 4 pm ?1 tab at 8 pm  ? Cholecalciferol 50 MCG (2000 UT) CAPS Take 2,000 Units by mouth daily.   ? cyanocobalamin (,VITAMIN B-12,) 1000 MCG/ML injection Inject 1,000 mcg into the muscle every 30 (thirty) days.  ? Ensure (ENSURE) DRINK 1 CAN BY MOUTH TWICE A DAY (OR 120ML  MEDPASS) FOR SUPPLEMENT  ? ferrous sulfate (FEROSUL) 325 (65 FE) MG tablet Take 325 mg by mouth in the morning and at bedtime. Anemia  ? fludrocortisone (FLORINEF) 0.1 MG tablet Take 2 tablets (0.2 mg total) by mouth 2 (two) times daily.

## 2021-06-27 ENCOUNTER — Non-Acute Institutional Stay (SKILLED_NURSING_FACILITY): Payer: Medicare Other | Admitting: Adult Health

## 2021-06-27 ENCOUNTER — Encounter: Payer: Self-pay | Admitting: Adult Health

## 2021-06-27 DIAGNOSIS — J189 Pneumonia, unspecified organism: Secondary | ICD-10-CM

## 2021-06-27 DIAGNOSIS — G20 Parkinson's disease: Secondary | ICD-10-CM | POA: Diagnosis not present
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
## 2021-06-30 ENCOUNTER — Encounter (HOSPITAL_COMMUNITY): Payer: Self-pay | Admitting: Emergency Medicine

## 2021-06-30 ENCOUNTER — Emergency Department (HOSPITAL_COMMUNITY)
Admission: EM | Admit: 2021-06-30 | Discharge: 2021-06-30 | Disposition: A | Discharge status: Skilled Nursing Facility | Payer: Medicare Other | Attending: Emergency Medicine | Admitting: Emergency Medicine

## 2021-06-30 ENCOUNTER — Other Ambulatory Visit: Payer: Self-pay

## 2021-06-30 DIAGNOSIS — G309 Alzheimer's disease, unspecified: Secondary | ICD-10-CM | POA: Insufficient documentation

## 2021-06-30 DIAGNOSIS — Z9981 Dependence on supplemental oxygen: Secondary | ICD-10-CM

## 2021-06-30 DIAGNOSIS — Z7901 Long term (current) use of anticoagulants: Secondary | ICD-10-CM | POA: Insufficient documentation

## 2021-06-30 DIAGNOSIS — G20 Parkinson's disease: Secondary | ICD-10-CM | POA: Insufficient documentation

## 2021-06-30 DIAGNOSIS — Z87891 Personal history of nicotine dependence: Secondary | ICD-10-CM | POA: Insufficient documentation

## 2021-06-30 DIAGNOSIS — F0390 Unspecified dementia without behavioral disturbance: Secondary | ICD-10-CM | POA: Insufficient documentation
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
## 2021-06-30 NOTE — Discharge Instructions (Signed)
You were evaluated in the Emergency Department and after careful evaluation, we did not find any emergent condition requiring admission or further testing in the hospital.  Your exam/testing today was overall reassuring.  Please return to the Emergency Department if you experience any worsening of your condition.  Thank you for allowing us to be a part of your care.  

## 2021-06-30 NOTE — ED Triage Notes (Signed)
Pt BIB by [facility] from [facility] facility. Staff called EMS because they were unable to obtain an O2 saturation during routine vital sign rounding. Patient does wear O2 at baseline however staff unable to report how much he usually wears due to not being able to obtain the patients medical records. EMS placed patient on 3 L O2 via [place]. Upon arrival, patient does have cold hands and unable to pick up O2 sat on finger however SpO2 reading on forehead showing 100%. Patient pink warm and dry. NAD at this time. VSS.  ?

## 2021-07-11 ENCOUNTER — Non-Acute Institutional Stay (SKILLED_NURSING_FACILITY): Payer: Medicare Other | Admitting: Adult Health

## 2021-07-11 DIAGNOSIS — S72142S Displaced intertrochanteric fracture of left femur, sequela: Secondary | ICD-10-CM

## 2021-07-11 DIAGNOSIS — D62 Acute posthemorrhagic anemia: Secondary | ICD-10-CM

## 2021-07-11 DIAGNOSIS — I951 Orthostatic hypotension: Secondary | ICD-10-CM | POA: Diagnosis not present

## 2021-07-11 DIAGNOSIS — I480 Paroxysmal atrial fibrillation: Secondary | ICD-10-CM

## 2021-07-11 DIAGNOSIS — G20 Parkinson's disease: Secondary | ICD-10-CM

## 2021-07-11 NOTE — Progress Notes (Signed)
Opened in error
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
## 2021-07-18 ENCOUNTER — Non-Acute Institutional Stay (SKILLED_NURSING_FACILITY): Payer: Medicare Other | Admitting: Internal Medicine

## 2021-07-18 ENCOUNTER — Encounter: Payer: Self-pay | Admitting: Internal Medicine

## 2021-07-18 DIAGNOSIS — R230 Cyanosis: Secondary | ICD-10-CM | POA: Diagnosis not present

## 2021-07-18 DIAGNOSIS — I4892 Unspecified atrial flutter: Secondary | ICD-10-CM | POA: Diagnosis not present

## 2021-07-18 DIAGNOSIS — G20 Parkinson's disease: Secondary | ICD-10-CM

## 2021-07-18 DIAGNOSIS — F0280 Dementia in other diseases classified elsewhere without behavioral disturbance: Secondary | ICD-10-CM | POA: Diagnosis not present

## 2021-07-18 NOTE — Assessment & Plan Note (Signed)
He can provide no history and follows commands poorly.  No behavioral issues have been reported by the staff. ?

## 2021-07-18 NOTE — Patient Instructions (Signed)
See assessment and plan under each diagnosis in the problem list and acutely for this visit 

## 2021-07-18 NOTE — Assessment & Plan Note (Addendum)
Rate is well controlled although the rhythm is slightly irregular.  He remains on Eliquis prophylaxis. ?

## 2021-07-18 NOTE — Assessment & Plan Note (Addendum)
Chart does not give a history of emphysema or chronic hypoxic respiratory failure.  Clinically he does have an emphysematous habitus. PTE not suspect as he is on Eliquis. No chest imaging was noted in the old chart.  The plan was to wean him from oxygen.  When the oxygen was discontinued he exhibited frank cyanosis of his digits with an O2 sat as low as 47%. ?Cyanosis resolving with reinstitution of 4 L of nasal oxygen. ?Baseline chest x-ray will be ordered. ?Supplemental oxygen at 2-4 L/min will be continued to maintain an O2 sat of at least 88%, preferably greater than 90%. ?

## 2021-07-18 NOTE — Progress Notes (Signed)
? ?  NURSING HOME LOCATION:  [facility] ?ROOM NUMBER:  [id number] ? ?CODE STATUS:  DNR ? ?PCP:  Orson Eva MD ? ?This is a nursing facility follow up visit for specific acute issue of cyanosis off O2. ? ?Interim medical record and care since last SNF visit was updated with review of diagnostic studies and change in clinical status since last visit were documented. ? ?HPI: He was admitted to this facility on 06/12/2021 following intramedullary nailing of the left proximal femur 06/10/29 for left intertrochanteric femur fragility fracture sustained in an unwitnessed fall in a memory care unit where he was residing.   ?In addition to dementia other diagnoses include PAF for which he takes Eliquis; Parkinson's disease; and peripheral neuropathy. ? ?Staff informed me that the goal was to wean him off oxygen after his discharge from the hospital.  The hospital record reveals no current chest x-rays.  Apparently he was taken off the oxygen today and I was notified that he was cyanotic.  O2 sats were reported as being 47% on room air.  Nasal oxygen at 4 L was reinitiated with improvement in his cyanosis and with adequate saturations. ? ?Review of systems: Dementia invalidated responses.  When asked how he was doing his response was "less than fair".  He denied any shortness of breath or other cardiopulmonary symptoms. ? ?Physical exam:  ?Pertinent or positive findings: He appears his stated age although he does not have facial wrinkling.  Facies are blank.  He follows commands very slowly.  Oral exam limited.Heart rhythm is slightly irregular but rate is well controlled.  Breath sounds are decreased.  There is some residual cyanosis of the fingertips.  He has 1+ pedal edema.  Pedal pulses are decreased.  He exhibits tremor of both upper extremities which is coarse and somewhat pill-rolling.  This is more striking in the right hand. ? ?General appearance: no acute distress, increased work of breathing is  present.   ?Lymphatic: No lymphadenopathy about the head, neck, axilla. ?Eyes: No conjunctival inflammation or lid edema is present. There is no scleral icterus. ?Ears:  External ear exam shows no significant lesions or deformities.   ?Nose:  External nasal examination shows no deformity or inflammation. Nasal mucosa are pink and moist without lesions, exudates ?Neck:  No thyromegaly, masses, tenderness noted.    ?Heart:  No gallop, murmur, click, rub .  ?Lungs:  without wheezes, rhonchi, rales, rubs. ?Abdomen: Bowel sounds are normal. Abdomen is soft and nontender with no organomegaly, hernias, masses. ?GU: Deferred  ?Extremities:  No clubbing  ?Neurologic exam :Balance, Rhomberg, finger to nose testing could not be completed due to clinical state ?Skin: Warm & dry w/o tenting. ?No significant lesions or rash. ? ?See summary under each active problem in the Problem List with associated updated therapeutic plan ? ? ?

## 2021-09-06 ENCOUNTER — Non-Acute Institutional Stay: Payer: Medicare Other | Admitting: Hospice

## 2021-09-06 DIAGNOSIS — F0280 Dementia in other diseases classified elsewhere without behavioral disturbance: Secondary | ICD-10-CM

## 2021-09-06 DIAGNOSIS — K5901 Slow transit constipation: Secondary | ICD-10-CM

## 2021-09-06 DIAGNOSIS — Z515 Encounter for palliative care: Secondary | ICD-10-CM

## 2021-09-06 DIAGNOSIS — S72142G Displaced intertrochanteric fracture of left femur, subsequent encounter for closed fracture with delayed healing: Secondary | ICD-10-CM

## 2021-09-06 DIAGNOSIS — F339 Major depressive disorder, recurrent, unspecified: Secondary | ICD-10-CM

## 2021-09-06 NOTE — Progress Notes (Signed)
[facility] Consult Note Telephone: [phone]  Fax: [phone]  PATIENT NAME: Stephen Gentry [address] [place] [address] [id number] (home)  DOB: [birth year] MRN: [id number]  PRIMARY CARE PROVIDER:    Clovia Cuff, MD,  [address] [place] [address] [phone]  REFERRING PROVIDER:   Dr. Keturah Barre. Hopper  RESPONSIBLE PARTY:    Contact Information     Name Relation Home Work Garner Spouse [phone]  [phone]        I met face to face with patient at facility. Visit to build trust and highlight Palliative Medicine as specialized medical care for people living with serious illness, aimed at facilitating better quality of life through symptoms relief, assisting with advance care planning and complex medical decision making.  ASSESSMENT AND / RECOMMENDATIONS:    CODE STATUS: Patient is a DO NOT RESUSCITATE. Signed DNR uploaded to Epic.  Goals of Care: Goals include to maximize quality of life and symptom management  Symptom Management/Plan: Left hip fracture:s/p Intramedullary nailing. Wound site healed. Continue physical therapy and facility restorative exercises.  Patient is nonambulatory, assist with transfers.  Fall precautions. Dementia: associated with hx of Parkinson disease.  FAST 6D. Continue Rivastigmine and Sinemet as ordered.  Optimize ongoing supportive care neurologist consult as planned.  Constipation: Managed with Senna, Bisacodyl, Milk of Magnesium as ordered.  Routine CBC CMP. Depression: Continue Paroxetine as ordered. Psych consult as ordered.  Follow up: Palliative care will continue to follow for complex medical decision making, advance care planning, and clarification of goals. Return 6 weeks or prn. Encouraged to call provider sooner with any concerns.   Family /Caregiver/Community Supports: Patient in SNF for acute rehab  HOSPICE ELIGIBILITY/DIAGNOSIS:  TBD  Chief Complaint: F/u visit  HISTORY OF PRESENT ILLNESS:  NOTNAMED Stephen Gentry is a 87 y.o. year old male  with multiple morbidities requiring close monitoring and with high risk of complications and mortality: Dementia associated with Parkinson disease, anemia, protein caloric malnutrition, orthostatic hypotension, left hip fracture. He was hospitalized 3/30 - 06/12/2021 with left intertrochanteric femur fragility fracture sustained in an unwitnessed fall. Intramedullary nailing of the left proximal femur was completed 3/31.  History obtained from review of EMR, discussion with primary team, caregiver, family and/or Mr. Laforte.  Review and summarization of Epic records shows history from other than patient. Rest of 10 point ROS asked and negative. Independent interpretation of tests and reviewed as needed, available labs, patient records, imaging, studies and related documents from the EMR.      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/26/2018   Weight loss 05/17/2018   Chronic anticoagulation 05/17/2018   Closed intertrochanteric fracture of left hip ([facility])    Protein-calorie malnutrition, severe 06/10/2021   Acute blood loss as cause of postoperative anemia 06/17/2021   Peripheral cyanosis 07/18/2021   Resolved Ambulatory Problems    Diagnosis Date Noted   Hip fracture ([facility]) 06/06/2021   Past  Medical History:  Diagnosis Date   Alzheimer's  dementia ([facility])    Anemia    Anticoagulant long-term use    Arthritis    Autonomic dysfunction    Baker's cyst    Chronic constipation    Diverticulosis    Internal hemorrhoids    Nocturia    Numbness and tingling in right hand    Numbness and tingling of both feet    PAF (paroxysmal atrial fibrillation) ([facility])    Parkinson's disease ([facility])    Tremor, essential    Wears glasses     SOCIAL HX:  Social History   Tobacco Use   Smoking status: Former    Years: 3.00    Types: Cigarettes    Quit date: 10/23/1956    Years since quitting: 64.9   Smokeless tobacco: Never  Substance Use Topics   Alcohol use: Not Currently    Alcohol/week: 0.0 standard drinks of alcohol     FAMILY HX:  Family History  Problem Relation Age of Onset   Lung cancer Mother    Lung cancer Father    Colon cancer Neg Hx    Esophageal cancer Neg Hx    Pancreatic cancer Neg Hx    Stomach cancer Neg Hx    Liver disease Neg Hx    Rectal cancer Neg Hx       ALLERGIES:  Allergies  Allergen Reactions   Lorazepam Other (See Comments)    Combative, foul language. Wife doesn't want patient to have again   Tetracyclines & Related Anaphylaxis   Donepezil Other (See Comments)    Didn't like   Amitiza [Lubiprostone] Nausea Only      PERTINENT MEDICATIONS:  Outpatient Encounter Medications as of 09/06/2021  Medication Sig   acetaminophen (TYLENOL) 325 MG tablet Take 2 tablets (650 mg total) by mouth every 6 (six) hours as needed for mild pain, fever or headache.   apixaban (ELIQUIS) 5 MG TABS tablet Take 1 tablet (5 mg total) by mouth 2 (two) times daily.   B Complex-C (SUPER B COMPLEX PO) Take 1 tablet by mouth daily.   bisacodyl (DULCOLAX) 10 MG suppository f not relieved by MOM, give 10 mg Bisacodyl suppositiory rectally X 1 dose in 24 hours as needed   Carbidopa-Levodopa ER (SINEMET CR) 25-100 MG tablet controlled release Take 2 tablets by mouth 3 (three) times daily. 2 tabs at 6 am, 11 am, 4 pm 1 tab  at 8 pm   Cholecalciferol 50 MCG (2000 UT) CAPS Take 2,000 Units by mouth daily.    cyanocobalamin (,VITAMIN B-12,) 1000 MCG/ML injection Inject 1,000 mcg into the muscle every 30 (thirty) days.   Ensure (ENSURE) DRINK 1 CAN BY MOUTH TWICE A DAY (OR 120ML MEDPASS) FOR SUPPLEMENT   ferrous sulfate 325 (65 FE) MG tablet Take 325 mg by mouth in the morning and at bedtime. Anemia   fludrocortisone (FLORINEF) 0.1 MG tablet Take 2 tablets (0.2 mg total) by mouth 2 (two) times daily.   midodrine (PROAMATINE) 10 MG tablet Take 1 tablet (10 mg) at 7 AM, 11 AM, and 3 PM (Patient taking differently: Take 1 tablet (10 mg) at 8 AM, 12 PM, and 10 PM)   mirtazapine (REMERON) 7.5 MG tablet Take 7.5 mg by mouth at bedtime.   PARoxetine (PAXIL) 30 MG tablet Take 1 tablet (30 mg total) by mouth daily.   polyethylene glycol (MIRALAX / GLYCOLAX) 17 g packet Take 34 g by mouth 2 (two) times daily.   potassium chloride (  KLOR-CON) 10 MEQ tablet Take 3 tablets (30 mEq total) by mouth daily.   rivastigmine (EXELON) 9.5 mg/24hr Place 9.5 mg onto the skin daily.   senna (SENOKOT) 8.6 MG TABS tablet Take 1 tablet by mouth in the morning and at bedtime.   No facility-administered encounter medications on file as of 09/06/2021.   I spent 45 minutes providing this consultation; this includes time spent with patient/family, chart review and documentation. More than 50% of the time in this consultation was spent on counseling and coordinating communication   Thank you for the opportunity to participate in the care of Mr. Buchan.  The palliative care team will continue to follow. Please call our office at [phone] if we can be of additional assistance.   Note: Portions of this note were generated with [employer]. Dictation errors may occur despite best attempts at proofreading.  Teodoro Spray, NP

## 2021-09-09 ENCOUNTER — Emergency Department (HOSPITAL_COMMUNITY): Payer: Medicare Other

## 2021-09-09 ENCOUNTER — Other Ambulatory Visit: Payer: Self-pay

## 2021-09-09 ENCOUNTER — Inpatient Hospital Stay (HOSPITAL_COMMUNITY)
Admission: EM | Admit: 2021-09-09 | Discharge: 2021-09-13 | DRG: 291 | Disposition: A | Discharge status: Skilled Nursing Facility | Payer: Medicare Other | Source: Skilled Nursing Facility | Attending: Internal Medicine | Admitting: Internal Medicine

## 2021-09-09 ENCOUNTER — Inpatient Hospital Stay (HOSPITAL_COMMUNITY): Payer: Medicare Other

## 2021-09-09 DIAGNOSIS — L8990 Pressure ulcer of unspecified site, unspecified stage: Secondary | ICD-10-CM | POA: Diagnosis present

## 2021-09-09 DIAGNOSIS — I509 Heart failure, unspecified: Secondary | ICD-10-CM

## 2021-09-09 DIAGNOSIS — Z9181 History of falling: Secondary | ICD-10-CM | POA: Diagnosis not present

## 2021-09-09 DIAGNOSIS — F32A Depression, unspecified: Secondary | ICD-10-CM | POA: Diagnosis present

## 2021-09-09 DIAGNOSIS — Z66 Do not resuscitate: Secondary | ICD-10-CM | POA: Diagnosis present

## 2021-09-09 DIAGNOSIS — I5033 Acute on chronic diastolic (congestive) heart failure: Secondary | ICD-10-CM | POA: Diagnosis present

## 2021-09-09 DIAGNOSIS — Z9981 Dependence on supplemental oxygen: Secondary | ICD-10-CM

## 2021-09-09 DIAGNOSIS — J9811 Atelectasis: Secondary | ICD-10-CM | POA: Diagnosis present

## 2021-09-09 DIAGNOSIS — S0990XA Unspecified injury of head, initial encounter: Secondary | ICD-10-CM | POA: Diagnosis present

## 2021-09-09 DIAGNOSIS — F0283 Dementia in other diseases classified elsewhere, unspecified severity, with mood disturbance: Secondary | ICD-10-CM | POA: Diagnosis present

## 2021-09-09 DIAGNOSIS — I480 Paroxysmal atrial fibrillation: Secondary | ICD-10-CM | POA: Diagnosis present

## 2021-09-09 DIAGNOSIS — I951 Orthostatic hypotension: Secondary | ICD-10-CM | POA: Diagnosis present

## 2021-09-09 DIAGNOSIS — J9601 Acute respiratory failure with hypoxia: Secondary | ICD-10-CM | POA: Diagnosis not present

## 2021-09-09 DIAGNOSIS — W1830XA Fall on same level, unspecified, initial encounter: Secondary | ICD-10-CM | POA: Diagnosis present

## 2021-09-09 DIAGNOSIS — G20A1 Parkinson's disease without dyskinesia, without mention of fluctuations: Secondary | ICD-10-CM | POA: Diagnosis present

## 2021-09-09 DIAGNOSIS — J189 Pneumonia, unspecified organism: Principal | ICD-10-CM

## 2021-09-09 DIAGNOSIS — I248 Other forms of acute ischemic heart disease: Secondary | ICD-10-CM | POA: Diagnosis present

## 2021-09-09 DIAGNOSIS — E876 Hypokalemia: Secondary | ICD-10-CM

## 2021-09-09 DIAGNOSIS — F02B18 Dementia in other diseases classified elsewhere, moderate, with other behavioral disturbance: Secondary | ICD-10-CM | POA: Diagnosis not present

## 2021-09-09 DIAGNOSIS — Y92129 Unspecified place in nursing home as the place of occurrence of the external cause: Secondary | ICD-10-CM

## 2021-09-09 DIAGNOSIS — I3139 Other pericardial effusion (noninflammatory): Secondary | ICD-10-CM | POA: Diagnosis present

## 2021-09-09 DIAGNOSIS — E538 Deficiency of other specified B group vitamins: Secondary | ICD-10-CM | POA: Diagnosis present

## 2021-09-09 DIAGNOSIS — Z20822 Contact with and (suspected) exposure to covid-19: Secondary | ICD-10-CM | POA: Diagnosis present

## 2021-09-09 DIAGNOSIS — L89302 Pressure ulcer of unspecified buttock, stage 2: Secondary | ICD-10-CM | POA: Diagnosis not present

## 2021-09-09 DIAGNOSIS — G20 Parkinson's disease: Secondary | ICD-10-CM | POA: Diagnosis present

## 2021-09-09 DIAGNOSIS — R296 Repeated falls: Secondary | ICD-10-CM | POA: Diagnosis present

## 2021-09-09 DIAGNOSIS — R42 Dizziness and giddiness: Secondary | ICD-10-CM | POA: Diagnosis present

## 2021-09-09 DIAGNOSIS — I110 Hypertensive heart disease with heart failure: Secondary | ICD-10-CM | POA: Diagnosis present

## 2021-09-09 DIAGNOSIS — G309 Alzheimer's disease, unspecified: Secondary | ICD-10-CM | POA: Diagnosis present

## 2021-09-09 DIAGNOSIS — J9621 Acute and chronic respiratory failure with hypoxia: Secondary | ICD-10-CM | POA: Diagnosis present

## 2021-09-09 DIAGNOSIS — R1310 Dysphagia, unspecified: Secondary | ICD-10-CM | POA: Diagnosis present

## 2021-09-09 DIAGNOSIS — F0280 Dementia in other diseases classified elsewhere without behavioral disturbance: Secondary | ICD-10-CM

## 2021-09-09 DIAGNOSIS — Z7401 Bed confinement status: Secondary | ICD-10-CM | POA: Diagnosis not present

## 2021-09-09 DIAGNOSIS — I5043 Acute on chronic combined systolic (congestive) and diastolic (congestive) heart failure: Secondary | ICD-10-CM | POA: Diagnosis present

## 2021-09-09 DIAGNOSIS — L89152 Pressure ulcer of sacral region, stage 2: Secondary | ICD-10-CM | POA: Diagnosis present

## 2021-09-09 DIAGNOSIS — Z7901 Long term (current) use of anticoagulants: Secondary | ICD-10-CM

## 2021-09-09 DIAGNOSIS — J90 Pleural effusion, not elsewhere classified: Secondary | ICD-10-CM

## 2021-09-09 DIAGNOSIS — Z888 Allergy status to other drugs, medicaments and biological substances status: Secondary | ICD-10-CM

## 2021-09-09 DIAGNOSIS — R269 Unspecified abnormalities of gait and mobility: Secondary | ICD-10-CM | POA: Diagnosis present

## 2021-09-09 DIAGNOSIS — Z801 Family history of malignant neoplasm of trachea, bronchus and lung: Secondary | ICD-10-CM | POA: Diagnosis not present

## 2021-09-09 DIAGNOSIS — Z79899 Other long term (current) drug therapy: Secondary | ICD-10-CM

## 2021-09-09 LAB — I-STAT VENOUS BLOOD GAS, ED
Acid-Base Excess: 13 mmol/L — ABNORMAL HIGH (ref 0.0–2.0)
Bicarbonate: 38 mmol/L — ABNORMAL HIGH (ref 20.0–28.0)
Calcium, Ion: 1.1 mmol/L — ABNORMAL LOW (ref 1.15–1.40)
HCT: 36 % — ABNORMAL LOW (ref 39.0–52.0)
Hemoglobin: 12.2 g/dL — ABNORMAL LOW (ref 13.0–17.0)
O2 Saturation: 94 %
Potassium: 3.2 mmol/L — ABNORMAL LOW (ref 3.5–5.1)
Sodium: 141 mmol/L (ref 135–145)
TCO2: 39 mmol/L — ABNORMAL HIGH (ref 22–32)
pCO2, Ven: 48.1 mmHg (ref 44–60)
pH, Ven: 7.506 — ABNORMAL HIGH (ref 7.25–7.43)
pO2, Ven: 67 mmHg — ABNORMAL HIGH (ref 32–45)

## 2021-09-09 LAB — CBC WITH DIFFERENTIAL/PLATELET
Abs Immature Granulocytes: 0.01 10*3/uL (ref 0.00–0.07)
Basophils Absolute: 0 10*3/uL (ref 0.0–0.1)
Basophils Relative: 1 %
Eosinophils Absolute: 0 10*3/uL (ref 0.0–0.5)
Eosinophils Relative: 1 %
HCT: 39.4 % (ref 39.0–52.0)
Hemoglobin: 12.1 g/dL — ABNORMAL LOW (ref 13.0–17.0)
Immature Granulocytes: 0 %
Lymphocytes Relative: 35 %
Lymphs Abs: 1.3 10*3/uL (ref 0.7–4.0)
MCH: 27.2 pg (ref 26.0–34.0)
MCHC: 30.7 g/dL (ref 30.0–36.0)
MCV: 88.5 fL (ref 80.0–100.0)
Monocytes Absolute: 0.4 10*3/uL (ref 0.1–1.0)
Monocytes Relative: 10 %
Neutro Abs: 1.9 10*3/uL (ref 1.7–7.7)
Neutrophils Relative %: 53 %
Platelets: 172 10*3/uL (ref 150–400)
RBC: 4.45 MIL/uL (ref 4.22–5.81)
RDW: 16.1 % — ABNORMAL HIGH (ref 11.5–15.5)
WBC: 3.6 10*3/uL — ABNORMAL LOW (ref 4.0–10.5)
nRBC: 0 % (ref 0.0–0.2)

## 2021-09-09 LAB — BASIC METABOLIC PANEL
Anion gap: 9 (ref 5–15)
BUN: 18 mg/dL (ref 8–23)
CO2: 33 mmol/L — ABNORMAL HIGH (ref 22–32)
Calcium: 9 mg/dL (ref 8.9–10.3)
Chloride: 100 mmol/L (ref 98–111)
Creatinine, Ser: 0.74 mg/dL (ref 0.61–1.24)
GFR, Estimated: >60 mL/min (ref >60)
Glucose, Bld: 108 mg/dL — ABNORMAL HIGH (ref 70–99)
Potassium: 3.3 mmol/L — ABNORMAL LOW (ref 3.5–5.1)
Sodium: 142 mmol/L (ref 135–145)

## 2021-09-09 LAB — BRAIN NATRIURETIC PEPTIDE: B Natriuretic Peptide: 736.1 pg/mL — ABNORMAL HIGH (ref 0.0–100.0)

## 2021-09-09 LAB — TROPONIN I (HIGH SENSITIVITY)
Troponin I (High Sensitivity): 23 ng/L — ABNORMAL HIGH (ref <18)
Troponin I (High Sensitivity): 22 ng/L — ABNORMAL HIGH (ref <18)

## 2021-09-09 LAB — RESP PANEL BY RT-PCR (FLU A&B, COVID) ARPGX2
Influenza A by PCR: NEGATIVE
Influenza B by PCR: NEGATIVE
SARS Coronavirus 2 by RT PCR: NEGATIVE

## 2021-09-09 MED ORDER — SODIUM CHLORIDE 0.9 % IV SOLN
500.0000 mg | Freq: Once | INTRAVENOUS | Status: AC
  Administered 2021-09-09: 500 mg via INTRAVENOUS
  Filled 2021-09-09: qty 5

## 2021-09-09 MED ORDER — FLUDROCORTISONE ACETATE 0.1 MG PO TABS
0.1000 mg | ORAL_TABLET | Freq: Two times a day (BID) | ORAL | Status: DC
  Administered 2021-09-10 – 2021-09-13 (×7): 0.1 mg via ORAL
  Filled 2021-09-09 (×8): qty 1

## 2021-09-09 MED ORDER — FUROSEMIDE 10 MG/ML IJ SOLN
40.0000 mg | Freq: Every day | INTRAMUSCULAR | Status: DC
Start: 2021-09-10 — End: 2021-09-10
  Administered 2021-09-10: 40 mg via INTRAVENOUS
  Filled 2021-09-09: qty 4

## 2021-09-09 MED ORDER — PAROXETINE HCL 30 MG PO TABS
30.0000 mg | ORAL_TABLET | Freq: Every day | ORAL | Status: DC
  Administered 2021-09-10 – 2021-09-11 (×2): 30 mg via ORAL
  Filled 2021-09-09 (×2): qty 1

## 2021-09-09 MED ORDER — APIXABAN 5 MG PO TABS
5.0000 mg | ORAL_TABLET | Freq: Two times a day (BID) | ORAL | Status: DC
  Administered 2021-09-09 – 2021-09-13 (×8): 5 mg via ORAL
  Filled 2021-09-09 (×8): qty 1

## 2021-09-09 MED ORDER — ACETAMINOPHEN 325 MG PO TABS
650.0000 mg | ORAL_TABLET | Freq: Four times a day (QID) | ORAL | Status: DC | PRN
  Administered 2021-09-12: 650 mg via ORAL
  Filled 2021-09-09: qty 2

## 2021-09-09 MED ORDER — SODIUM CHLORIDE 0.9% FLUSH
3.0000 mL | Freq: Two times a day (BID) | INTRAVENOUS | Status: DC
  Administered 2021-09-09 – 2021-09-13 (×7): 3 mL via INTRAVENOUS

## 2021-09-09 MED ORDER — SODIUM CHLORIDE 0.9 % IV SOLN
1.0000 g | INTRAVENOUS | Status: DC
  Administered 2021-09-10: 1 g via INTRAVENOUS
  Filled 2021-09-09: qty 10

## 2021-09-09 MED ORDER — POLYETHYLENE GLYCOL 3350 17 G PO PACK
34.0000 g | PACK | Freq: Two times a day (BID) | ORAL | Status: DC
  Administered 2021-09-13: 34 g via ORAL
  Filled 2021-09-09 (×5): qty 2

## 2021-09-09 MED ORDER — AZITHROMYCIN 250 MG PO TABS
250.0000 mg | ORAL_TABLET | Freq: Every day | ORAL | Status: DC
  Administered 2021-09-10: 250 mg via ORAL
  Filled 2021-09-09: qty 1

## 2021-09-09 MED ORDER — MIRTAZAPINE 15 MG PO TABS
7.5000 mg | ORAL_TABLET | Freq: Every day | ORAL | Status: DC
  Administered 2021-09-09 – 2021-09-12 (×4): 7.5 mg via ORAL
  Filled 2021-09-09 (×4): qty 1

## 2021-09-09 MED ORDER — ENSURE ENLIVE PO LIQD
237.0000 mL | Freq: Two times a day (BID) | ORAL | Status: DC
  Administered 2021-09-12 – 2021-09-13 (×3): 237 mL via ORAL
  Filled 2021-09-09: qty 237

## 2021-09-09 MED ORDER — POTASSIUM CHLORIDE CRYS ER 10 MEQ PO TBCR
30.0000 meq | EXTENDED_RELEASE_TABLET | Freq: Every day | ORAL | Status: DC
  Administered 2021-09-09 – 2021-09-10 (×2): 30 meq via ORAL
  Filled 2021-09-09 (×3): qty 3

## 2021-09-09 MED ORDER — SODIUM CHLORIDE 0.9 % IV SOLN
1.0000 g | Freq: Once | INTRAVENOUS | Status: AC
  Administered 2021-09-09: 1 g via INTRAVENOUS
  Filled 2021-09-09: qty 10

## 2021-09-09 MED ORDER — BISACODYL 10 MG RE SUPP
10.0000 mg | Freq: Every day | RECTAL | Status: DC | PRN
Start: 2021-09-09 — End: 2021-09-13

## 2021-09-09 MED ORDER — ENSURE PO LIQD: 237.0000 mL | Freq: Two times a day (BID) | ORAL | Status: DC

## 2021-09-09 MED ORDER — HYDRALAZINE HCL 20 MG/ML IJ SOLN: 5.0000 mg | Freq: Four times a day (QID) | INTRAMUSCULAR | Status: DC | PRN

## 2021-09-09 MED ORDER — SENNA 8.6 MG PO TABS
1.0000 | ORAL_TABLET | Freq: Every day | ORAL | Status: DC
Start: 2021-09-10 — End: 2021-09-13
  Administered 2021-09-10 – 2021-09-13 (×4): 8.6 mg via ORAL
  Filled 2021-09-09 (×4): qty 1

## 2021-09-09 MED ORDER — FUROSEMIDE 10 MG/ML IJ SOLN
40.0000 mg | Freq: Once | INTRAMUSCULAR | Status: AC
  Administered 2021-09-09: 40 mg via INTRAVENOUS
  Filled 2021-09-09: qty 4

## 2021-09-09 MED ORDER — CARBIDOPA-LEVODOPA ER 25-100 MG PO TBCR
2.0000 | EXTENDED_RELEASE_TABLET | Freq: Three times a day (TID) | ORAL | Status: DC
  Administered 2021-09-10 – 2021-09-13 (×10): 2 via ORAL
  Filled 2021-09-09 (×12): qty 2

## 2021-09-09 MED ORDER — CARBIDOPA-LEVODOPA ER 25-100 MG PO TBCR
1.0000 | EXTENDED_RELEASE_TABLET | Freq: Every day | ORAL | Status: DC
  Administered 2021-09-10 – 2021-09-12 (×3): 1 via ORAL
  Filled 2021-09-09 (×6): qty 1

## 2021-09-09 NOTE — ED Notes (Signed)
Patient transported to CT by this RN 

## 2021-09-09 NOTE — Progress Notes (Signed)
   09/09/21 1300  Clinical Encounter Type  Visited With Patient  Visit Type Initial;ED;Trauma  Referral From Nurse  Consult/Referral To Chaplain  Spiritual Encounters  Spiritual Needs Prayer  Stress Factors  Patient Stress Factors None identified    Chaplain met with patient at bedside; not alert - sleeping at this time.  Will seek if family is present at this time.
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
## 2021-09-09 NOTE — ED Triage Notes (Signed)
Pt BIB [facility] from [facility] c/o a fall that happened last night around 6pm. Pt is alert to self per facility and they found him in the floor, they put him back in the bed and then this morning the PA wanted him to be checked out. Pt takes eliquis

## 2021-09-09 NOTE — ED Notes (Signed)
Pt was incontinent of BM and urine. Cleaned pt and applied a clean brief.

## 2021-09-10 ENCOUNTER — Inpatient Hospital Stay (HOSPITAL_COMMUNITY): Payer: Medicare Other

## 2021-09-10 DIAGNOSIS — I5043 Acute on chronic combined systolic (congestive) and diastolic (congestive) heart failure: Secondary | ICD-10-CM

## 2021-09-10 DIAGNOSIS — L8990 Pressure ulcer of unspecified site, unspecified stage: Secondary | ICD-10-CM | POA: Diagnosis present

## 2021-09-10 DIAGNOSIS — I5033 Acute on chronic diastolic (congestive) heart failure: Secondary | ICD-10-CM | POA: Diagnosis not present

## 2021-09-10 DIAGNOSIS — G309 Alzheimer's disease, unspecified: Secondary | ICD-10-CM

## 2021-09-10 DIAGNOSIS — J9601 Acute respiratory failure with hypoxia: Secondary | ICD-10-CM

## 2021-09-10 DIAGNOSIS — G20A1 Parkinson's disease without dyskinesia, without mention of fluctuations: Secondary | ICD-10-CM | POA: Diagnosis present

## 2021-09-10 DIAGNOSIS — F02B18 Dementia in other diseases classified elsewhere, moderate, with other behavioral disturbance: Secondary | ICD-10-CM

## 2021-09-10 DIAGNOSIS — E876 Hypokalemia: Secondary | ICD-10-CM

## 2021-09-10 DIAGNOSIS — G20 Parkinson's disease: Secondary | ICD-10-CM | POA: Diagnosis not present

## 2021-09-10 DIAGNOSIS — L89302 Pressure ulcer of unspecified buttock, stage 2: Secondary | ICD-10-CM

## 2021-09-10 DIAGNOSIS — I480 Paroxysmal atrial fibrillation: Secondary | ICD-10-CM | POA: Diagnosis present

## 2021-09-10 DIAGNOSIS — F0280 Dementia in other diseases classified elsewhere without behavioral disturbance: Secondary | ICD-10-CM

## 2021-09-10 LAB — BLOOD GAS, VENOUS
Acid-Base Excess: 13 mmol/L — ABNORMAL HIGH (ref 0.0–2.0)
Bicarbonate: 38.7 mmol/L — ABNORMAL HIGH (ref 20.0–28.0)
O2 Saturation: 64.5 %
Patient temperature: 37.1
pCO2, Ven: 52 mmHg (ref 44–60)
pH, Ven: 7.48 — ABNORMAL HIGH (ref 7.25–7.43)
pO2, Ven: 38 mmHg (ref 32–45)

## 2021-09-10 LAB — BASIC METABOLIC PANEL
Anion gap: 14 (ref 5–15)
BUN: 17 mg/dL (ref 8–23)
CO2: 31 mmol/L (ref 22–32)
Calcium: 9.3 mg/dL (ref 8.9–10.3)
Chloride: 100 mmol/L (ref 98–111)
Creatinine, Ser: 0.67 mg/dL (ref 0.61–1.24)
GFR, Estimated: >60 mL/min (ref >60)
Glucose, Bld: 91 mg/dL (ref 70–99)
Potassium: 2.9 mmol/L — ABNORMAL LOW (ref 3.5–5.1)
Sodium: 145 mmol/L (ref 135–145)

## 2021-09-10 LAB — ECHOCARDIOGRAM COMPLETE
AR max vel: 2.65 cm2
AV Area VTI: 2.68 cm2
AV Area mean vel: 2.43 cm2
AV Mean grad: 2 mmHg
AV Peak grad: 3.3 mmHg
Ao pk vel: 0.91 m/s
Area-P 1/2: 2.83 cm2
Calc EF: 56.4 %
Height: 72 in
S' Lateral: 3.6 cm
Single Plane A2C EF: 56.6 %
Single Plane A4C EF: 48.5 %
Weight: 2313.95 oz

## 2021-09-10 MED ORDER — POTASSIUM CHLORIDE 20 MEQ PO PACK: 40.0000 meq | PACK | ORAL | Status: DC

## 2021-09-10 MED ORDER — POTASSIUM CHLORIDE 10 MEQ/100ML IV SOLN
10.0000 meq | INTRAVENOUS | Status: AC
  Administered 2021-09-10 – 2021-09-11 (×4): 10 meq via INTRAVENOUS
  Filled 2021-09-10 (×4): qty 100

## 2021-09-10 MED ORDER — FUROSEMIDE 10 MG/ML IJ SOLN: 40.0000 mg | Freq: Two times a day (BID) | INTRAMUSCULAR | Status: DC

## 2021-09-10 MED ORDER — POTASSIUM CHLORIDE 20 MEQ PO PACK
40.0000 meq | PACK | Freq: Once | ORAL | Status: AC
  Administered 2021-09-10: 40 meq via ORAL
  Filled 2021-09-10: qty 2

## 2021-09-10 MED ORDER — FUROSEMIDE 10 MG/ML IJ SOLN
40.0000 mg | Freq: Two times a day (BID) | INTRAMUSCULAR | Status: DC
Start: 2021-09-10 — End: 2021-09-12
  Administered 2021-09-10 – 2021-09-11 (×3): 40 mg via INTRAVENOUS
  Filled 2021-09-10 (×3): qty 4

## 2021-09-10 MED ORDER — SODIUM CHLORIDE 0.9 % IV SOLN: INTRAVENOUS | Status: DC | PRN

## 2021-09-10 MED ORDER — EMPAGLIFLOZIN 10 MG PO TABS
10.0000 mg | ORAL_TABLET | Freq: Every day | ORAL | Status: DC
  Administered 2021-09-10 – 2021-09-13 (×4): 10 mg via ORAL
  Filled 2021-09-10 (×4): qty 1

## 2021-09-10 MED ORDER — PERFLUTREN LIPID MICROSPHERE
1.0000 mL | INTRAVENOUS | Status: AC | PRN
  Administered 2021-09-10: 2 mL via INTRAVENOUS

## 2021-09-10 MED ORDER — FOOD THICKENER (SIMPLYTHICK HONEY)
1.0000 | ORAL | Status: DC | PRN
Start: 2021-09-10 — End: 2021-09-13

## 2021-09-10 NOTE — Hospital Course (Addendum)
Stephen Gentry was admitted to the hospital with the working diagnosis of acute on chronic hypoxemic respiratory failure.   87 yo male with the past medical history of Parkinson's disease, orthostatic hypotension, diastolic heart failure, paroxysmal atrial fibrillation, dysphagia and ambulatory dysfunction who presented after a mechanical fall at the assisted living facility. Reported feeling dizzy and light headed before the fall, with no loss of consciousness. On his initial physical examination his blood pressure was 151/92, HR 64, RR 13 and 02 saturation 95%, lungs with decreased breath sounds bilaterally at the lower lobes, heart with S1 and S2 present irregularly irregular, abdomen not distended and no lowe extremity edema.   Na 142 K 3,3 CL 100, bicarbonate 33 glucose 108 bun 18 cr 0,74  BNP 736  High sensitive troponin 23 and 22  Wbc 3,6 hgb 12,1 plt 172  Sars covid 19 negative   Head and neck CT with no acute changes.   Chest radiograph with mild cardiomegaly with bilateral hilar vascular congestion and bilateral pleural effusions.  CT chest with bilateral pleural effusions with associated compression atelectasis.   EKG 67 bpm, normal axis. Qtc 585, atrial fibrillation with poor R wave progression, no significant ST segment or  T wave changes.

## 2021-09-10 NOTE — Assessment & Plan Note (Signed)
Rate control atrial fibrillation, continue anticoagulation with apixaban Fall precautions.  If persistent falls, may need to reconsider anticoagulation therapy.

## 2021-09-10 NOTE — Assessment & Plan Note (Signed)
Patient has received Kcl po this am, will follow up on renal function and electrolytes today and daily while on IV furosemide.  Keep K at 4 and Mg at 2.

## 2021-09-10 NOTE — Assessment & Plan Note (Addendum)
Edema has improved with diuresis.   Echocardiogram with mild reduction in LV systolic function with EF 45 to 50%, apical segments of LV are akinetic, hypokinesis of the mid antero septal and mid anterior segments. RV systolic function is preserved. Small pericardial effusion.   Systolic blood pressure 142 to 157 mmHg.   Plan to continue diuresis with IV furosemide to further target negative fluid balance.  Continue with fludrocortisone for orthostatic hypotension.  Hold on midodrine for now, blood pressure has been stable. Add SGLT2 inh  Patient with very poor functional physical activity because advance Parkinson's disease. He has no active clinical signs of ischemia, will continue conservative medical therapy.
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
## 2021-09-10 NOTE — Assessment & Plan Note (Signed)
Acute pulmonary edema with bilateral pleural effusions.  No clinical signs of pneumonia, infection ruled out.   Plan to continue diuresis with furosemide. Continue oxymetry monitoring and supplemental 02 per  to keep -02 saturation 92% or greater.   Discontinue antibiotic therapy for now.

## 2021-09-10 NOTE — Evaluation (Signed)
Physical Therapy Evaluation Patient Details Name: Stephen Gentry MRN: [id number] DOB: 15-Jan-1935 Today's Date: 09/10/2021  History of Present Illness  87 y.o. male presents to [facility] hospital  from [place] on 09/09/21 with fall out of bed  and hypoxia. Found to have acute CHF exacerbation. PMH: Parkinsons dementia, falls, anemia, PAF, orthostatic hypotension, L femur fx with IM nail 3/23  Clinical Impression  Pt admitted with above diagnosis. Pt from [place] where he was no longer getting PT because he was unable to move LE's due to increase swelling. Swelling has decreased today and pt was able to perform SLR, HS, and LAQ as well as stand EOB with max A. Anticipate pt being able to resume working on transfers from bed to w/c with assist instead of use of lift. Pt expresses desire to mobilize as much as he can.  Pt currently with functional limitations due to the deficits listed below (see PT Problem List). Pt will benefit from skilled PT to increase their independence and safety with mobility to allow discharge to the venue listed below.          Recommendations for follow up therapy are one component of a multi-disciplinary discharge planning process, led by the attending physician.  Recommendations may be updated based on patient status, additional functional criteria and insurance authorization.  Follow Up Recommendations Skilled nursing-short term rehab (<3 hours/day) Can patient physically be transported by private vehicle: No    Assistance Recommended at Discharge Frequent or constant Supervision/Assistance  Patient can return home with the following  Two people to help with walking and/or transfers;Two people to help with bathing/dressing/bathroom;Assistance with cooking/housework;Assistance with feeding;Direct supervision/assist for medications management;Direct supervision/assist for financial management;Assist for transportation;Help with stairs or ramp for entrance    Equipment  Recommendations None recommended by PT  Recommendations for Other Services  OT consult    Functional Status Assessment Patient has had a recent decline in their functional status and demonstrates the ability to make significant improvements in function in a reasonable and predictable amount of time.     Precautions / Restrictions Precautions Precautions: Fall Restrictions Weight Bearing Restrictions: No      Mobility  Bed Mobility Overal bed mobility: Needs Assistance Bed Mobility: Rolling, Sidelying to Sit, Sit to Supine Rolling: Mod assist Sidelying to sit: Mod assist   Sit to supine: Total assist   General bed mobility comments: pt able to bridge knees when cued. Mod A to roll to L and grasp L rail with R hand. Mod A for LE's off bed and elevation of trunk into sitting. Tot A for return to supine in part due to difficulty problem solving the task    Transfers Overall transfer level: Needs assistance Equipment used: None Transfers: Sit to/from Stand Sit to Stand: Max assist           General transfer comment: pt achieved partial stand with therapist directly in front of him.  Unable to fully extend knees or trunk. Pt encouraged to be able to take wt through LE's though    Ambulation/Gait               General Gait Details: unable  Stairs            Wheelchair Mobility    Modified Rankin (Stroke Patients Only)       Balance Overall balance assessment: Needs assistance, History of Falls Sitting-balance support: Feet supported, No upper extremity supported Sitting balance-Leahy Scale: Fair Sitting balance - Comments: slow R lean with  min A to correct, once corrected was able to maintain sitting without challenge   Standing balance support: Bilateral upper extremity supported, During functional activity Standing balance-Leahy Scale: Zero Standing balance comment: max A to stand                             Pertinent Vitals/Pain Pain  Assessment Pain Assessment: No/denies pain    Home Living Family/patient expects to be discharged to:: Skilled nursing facility                   Additional Comments: possibly to palliative    Prior Function Prior Level of Function : Needs assist             Mobility Comments: wife reports PT had stopped at [facility] because pt was unable to move his LE's and assist with transfers as his swelling worsened. Hoyer lift to w/c by staff daily ADLs Comments: assist needed     Hand Dominance   Dominant Hand: Right    Extremity/Trunk Assessment   Upper Extremity Assessment Upper Extremity Assessment: Defer to OT evaluation;RUE deficits/detail RUE Deficits / Details: R hand swollen and red (wife says has improved from what it was). Limited wrist, hand, and finger ROM. Would benefit from OT eval and possible night splint for RUE. RUE Sensation: history of peripheral neuropathy    Lower Extremity Assessment Lower Extremity Assessment: Generalized weakness;RLE deficits/detail RLE Deficits / Details: stiffness BLE's typical of Parkinsons. However, pt able to lift each LE against gravity in supine, perform modified heel slide and perform LAQ in sitting which wife reports is large improvement from a few days ago when swelling was worse and he couldn't lift his LE's at all RLE Sensation: history of peripheral neuropathy RLE Coordination: decreased gross motor    Cervical / Trunk Assessment Cervical / Trunk Assessment: Kyphotic  Communication   Communication: Other (comment) (verbalizes but minimally)  Cognition Arousal/Alertness: Awake/alert Behavior During Therapy: Flat affect Overall Cognitive Status: History of cognitive impairments - at baseline                                 General Comments: follows commands, answers simple questions        General Comments General comments (skin integrity, edema, etc.): VSS. SPO2 remained in 90's on 2L O2. Prevalon  boots placed after session. Wife taught retrograde massage to R hand    Exercises     Assessment/Plan    PT Assessment Patient needs continued PT services  PT Problem List Decreased strength;Decreased range of motion;Decreased activity tolerance;Decreased balance;Decreased mobility;Decreased coordination;Decreased cognition;Decreased knowledge of use of DME;Decreased safety awareness;Decreased knowledge of precautions;Impaired tone;Impaired sensation       PT Treatment Interventions DME instruction;Functional mobility training;Therapeutic activities;Therapeutic exercise;Balance training;Neuromuscular re-education;Patient/family education    PT Goals (Current goals can be found in the Care Plan section)  Acute Rehab PT Goals Patient Stated Goal: resume therapy, be able to transfer into w/c without lift PT Goal Formulation: With patient/family Time For Goal Achievement: 09/24/21 Potential to Achieve Goals: Fair    Frequency Min 2X/week     Co-evaluation               AM-PAC PT "6 Clicks" Mobility  Outcome Measure Help needed turning from your back to your side while in a flat bed without using bedrails?: A Lot Help needed moving from lying on  your back to sitting on the side of a flat bed without using bedrails?: A Lot Help needed moving to and from a bed to a chair (including a wheelchair)?: A Lot Help needed standing up from a chair using your arms (e.g., wheelchair or bedside chair)?: Total Help needed to walk in hospital room?: Total Help needed climbing 3-5 steps with a railing? : Total 6 Click Score: 9    End of Session Equipment Utilized During Treatment: Gait belt;Oxygen Activity Tolerance: Patient tolerated treatment well Patient left: in bed;with call bell/phone within reach;with family/visitor present;with bed alarm set Nurse Communication: Mobility status PT Visit Diagnosis: Muscle weakness (generalized) (M62.81);History of falling (Z91.81)    Time:  7494-4967 PT Time Calculation (min) (ACUTE ONLY): 36 min   Charges:   PT Evaluation $PT Eval Moderate Complexity: 1 Mod PT Treatments $Therapeutic Activity: 8-22 mins        Lyanne Co, PT  Acute Rehab Services Secure chat preferred Office [phone]   Lawana Chambers Nafisa Olds 09/10/2021, 2:28 PM

## 2021-09-10 NOTE — Progress Notes (Addendum)
Progress Note   Patient: Stephen Gentry KGU:[id number] DOB: 1934-10-29 DOA: 09/09/2021     1 DOS: the patient was seen and examined on 09/10/2021   Brief hospital course: Mr. Andres was admitted to the hospital with the working diagnosis of acute on chronic hypoxemic respiratory failure.   87 yo male with the past medical history of Parkinson's disease, orthostatic hypotension, diastolic heart failure, paroxysmal atrial fibrillation, dysphagia and ambulatory dysfunction who presented after a mechanical fall at the assisted living facility. Reported feeling dizzy and light headed before the fall, with no loss of consciousness. On his initial physical examination his blood pressure was 151/92, HR 64, RR 13 and 02 saturation 95%, lungs with decreased breath sounds bilaterally at the lower lobes, heart with S1 and S2 present irregularly irregular, abdomen not distended and no lowe extremity edema.   Na 142 K 3,3 CL 100, bicarbonate 33 glucose 108 bun 18 cr 0,74  BNP 736  High sensitive troponin 23 and 22  Wbc 3,6 hgb 12,1 plt 172  Sars covid 19 negative   Head and neck CT with no acute changes.   Chest radiograph with mild cardiomegaly with bilateral hilar vascular congestion and bilateral pleural effusions.  CT chest with bilateral pleural effusions with associated compression atelectasis.   EKG 67 bpm, normal axis. Qtc 585, atrial fibrillation with poor R wave progression, no significant ST segment or  T wave changes.   Assessment and Plan: Acute on chronic diastolic CHF (congestive heart failure) (HCC) Edema has improved with diuresis.   Echocardiogram with mild reduction in LV systolic function with EF 45 to 50%, apical segments of LV are akinetic, hypokinesis of the mid antero septal and mid anterior segments. RV systolic function is preserved. Small pericardial effusion.   Systolic blood pressure 142 to 157 mmHg.   Plan to continue diuresis with IV furosemide to further target negative  fluid balance.  Continue with fludrocortisone for orthostatic hypotension.  Hold on midodrine for now, blood pressure has been stable. Add SGLT2 inh  Patient with very poor functional physical activity because advance Parkinson's disease. He has no active clinical signs of ischemia, will continue conservative medical therapy.   Acute hypoxemic respiratory failure (HCC) Acute pulmonary edema with bilateral pleural effusions.  No clinical signs of pneumonia, infection ruled out.   Plan to continue diuresis with furosemide. Continue oxymetry monitoring and supplemental 02 per Stanley to keep -02 saturation 92% or greater.   Discontinue antibiotic therapy for now.   Pressure injury of skin Stage pressure ulcer sacrum.  Present on admission Continue with local wound care.   Alzheimer's dementia (HCC) Depression  Continue with paroxetine and mirtazapine.   PAF (paroxysmal atrial fibrillation) (HCC) Rate control atrial fibrillation, continue anticoagulation with apixaban Fall precautions.  If persistent falls, may need to reconsider anticoagulation therapy.   Parkinson's disease (HCC) Continue with carbidopa and levodopa PT and OT evaluation, fall and aspiration precautions.   Hypokalemia Patient has received Kcl po this am, will follow up on renal function and electrolytes today and daily while on IV furosemide.  Keep K at 4 and Mg at 2.         Subjective: Patient with no chest pain,dyspnea and edema are improving, his wife is at the bedside and helps with communication.   Physical Exam: Vitals:   09/10/21 0115 09/10/21 0222 09/10/21 0422 09/10/21 1100  BP: (!) 156/100 (!) 163/87 (!) 142/93 (!) 157/82  Pulse: (!) 45 67 73 73  Resp: 17 14  16 19  Temp:  98.9 F (37.2 C) (!) 97.5 F (36.4 C) 99.1 F (37.3 C)  TempSrc:  Oral Oral Oral  SpO2: (!) 46%  100% 95%  Weight:  65.6 kg    Height:       Neurology awake and alert, positive rigidity and distal tremors, consistent  with parkinson's disease ENT with mild pallor Cardiovascular with S1 and S2 present irregularly irregular with no gallops, rubs or murmurs Respiratory with scattered raled but not wheezing Abdomen not distended  Data Reviewed:    Family Communication: I spoke with patient's wife at the bedside, we talked in detail about patient's condition, plan of care and prognosis and all questions were addressed.    Disposition: Status is: Inpatient Remains inpatient appropriate because: IV diuresis   Planned Discharge Destination:  assisted living facility     Author: Coralie Keens, MD 09/10/2021 3:21 PM  For on call review [email or web address].

## 2021-09-10 NOTE — Assessment & Plan Note (Signed)
Depression  Continue with paroxetine and mirtazapine.

## 2021-09-10 NOTE — Progress Notes (Signed)
SLP Cancellation Note  Patient Details Name: Stephen Gentry MRN: [id number] DOB: [birth year]   Cancelled treatment:       Reason Eval/Treat Not Completed: Patient at procedure or test/unavailable. SLP to return later this date for swallow evaluation, however based on MBS completed during previous hospitalization, April 2023, will adjust diet to reflect past recommendations secondary to suspected chronic dysphagia: Dys 3 solids, honey thick liquids.   Angela Nevin, MA, CCC-SLP Speech Therapy

## 2021-09-10 NOTE — Plan of Care (Signed)
  Problem: Safety: Goal: Ability to remain free from injury will improve Outcome: Progressing   

## 2021-09-10 NOTE — Assessment & Plan Note (Signed)
Continue with carbidopa and levodopa PT and OT evaluation, fall and aspiration precautions.

## 2021-09-10 NOTE — Progress Notes (Signed)
  Echocardiogram 2D Echocardiogram has been performed.  Roosvelt Maser F 09/10/2021, 10:11 AM

## 2021-09-10 NOTE — Consult Note (Signed)
WOC Nurse Consult Note: Reason for Consult:Stage 2 pressure injury to sacrum Wound type:pressure Pressure Injury POA: Yes Measurement:To be obtained by bedside RN today with first dressing placement and documented on Nursing Flow Sheet Wound HEN:IDPO, moist Drainage (amount, consistency, odor) scant serous Periwound:Intact Dressing procedure/placement/frequency: I have provided Nursing with guidance for turning and repositioning to minimize time in the supine position, for topical care to the affected area using an antimicrobial nonadherent (xeroform) as a wound contact layer and topping with a silicone foam for the sacrum. Bilateral heels are to be off loaded using Prevalon boots.  WOC nursing team will not follow, but will remain available to this patient, the nursing and medical teams.  Please re-consult if needed.  Thank you for inviting [place] to participate in this patient's Plan of Care.  Ladona Mow, MSN, RN, CNS, GNP, Leda Min, [employer], [employer] phone:  [phone]
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
## 2021-09-10 NOTE — Assessment & Plan Note (Signed)
Stage pressure ulcer sacrum.  Present on admission Continue with local wound care.

## 2021-09-11 DIAGNOSIS — I5043 Acute on chronic combined systolic (congestive) and diastolic (congestive) heart failure: Secondary | ICD-10-CM

## 2021-09-11 LAB — BASIC METABOLIC PANEL
Anion gap: 12 (ref 5–15)
BUN: 16 mg/dL (ref 8–23)
CO2: 34 mmol/L — ABNORMAL HIGH (ref 22–32)
Calcium: 8.9 mg/dL (ref 8.9–10.3)
Chloride: 98 mmol/L (ref 98–111)
Creatinine, Ser: 0.76 mg/dL (ref 0.61–1.24)
GFR, Estimated: >60 mL/min (ref >60)
Glucose, Bld: 83 mg/dL (ref 70–99)
Potassium: 2.6 mmol/L — CL (ref 3.5–5.1)
Sodium: 144 mmol/L (ref 135–145)

## 2021-09-11 LAB — MAGNESIUM: Magnesium: 2.1 mg/dL (ref 1.7–2.4)

## 2021-09-11 MED ORDER — POTASSIUM CHLORIDE 10 MEQ/100ML IV SOLN
10.0000 meq | INTRAVENOUS | Status: AC
  Administered 2021-09-11 (×6): 10 meq via INTRAVENOUS
  Filled 2021-09-11 (×6): qty 100

## 2021-09-11 MED ORDER — POTASSIUM CHLORIDE 20 MEQ PO PACK
40.0000 meq | PACK | Freq: Once | ORAL | Status: DC
Start: 2021-09-11 — End: 2021-09-12
  Filled 2021-09-11: qty 2

## 2021-09-11 MED ORDER — PAROXETINE HCL 30 MG PO TABS
30.0000 mg | ORAL_TABLET | Freq: Every day | ORAL | Status: DC
  Administered 2021-09-12: 30 mg via ORAL
  Filled 2021-09-11 (×2): qty 1

## 2021-09-11 MED ORDER — PAROXETINE HCL 30 MG PO TABS: 30.0000 mg | ORAL_TABLET | Freq: Every day | ORAL | Status: DC

## 2021-09-11 MED ORDER — CYANOCOBALAMIN 1000 MCG/ML IJ SOLN
1000.0000 ug | Freq: Once | INTRAMUSCULAR | Status: AC
  Administered 2021-09-11: 1000 ug via INTRAMUSCULAR
  Filled 2021-09-11 (×2): qty 1

## 2021-09-11 NOTE — Progress Notes (Signed)
Physical Therapy Treatment Patient Details Name: Stephen Gentry MRN: [id number] DOB: [birth year] Today's Date: 09/11/2021   History of Present Illness 87 y.o. male presents to [facility] hospital  from [place] on 09/09/21 with fall out of bed  and hypoxia. Found to have acute CHF exacerbation. PMH: Parkinsons dementia, falls, anemia, PAF, orthostatic hypotension, L femur fx with IM nail 3/23    PT Comments    Patient continues to be limited by hx of cognitive deficits and weakness. Patient oriented to self and hallucinating during session. Patient required modA+2 for bed mobility and maxA+2 to attempt standing x 2 with inability to come into upright posture. Continue to recommend SNF for ongoing Physical Therapy.      Recommendations for follow up therapy are one component of a multi-disciplinary discharge planning process, led by the attending physician.  Recommendations may be updated based on patient status, additional functional criteria and insurance authorization.  Follow Up Recommendations  Skilled nursing-short term rehab (<3 hours/day) Can patient physically be transported by private vehicle: No   Assistance Recommended at Discharge Frequent or constant Supervision/Assistance  Patient can return home with the following Two people to help with walking and/or transfers;Two people to help with bathing/dressing/bathroom;Assistance with cooking/housework;Assistance with feeding;Direct supervision/assist for medications management;Direct supervision/assist for financial management;Assist for transportation;Help with stairs or ramp for entrance   Equipment Recommendations  None recommended by PT    Recommendations for Other Services       Precautions / Restrictions Precautions Precautions: Fall Restrictions Weight Bearing Restrictions: No     Mobility  Bed Mobility Overal bed mobility: Needs Assistance Bed Mobility: Supine to Sit, Sit to Supine     Supine to sit: Mod assist, +2 for  safety/equipment Sit to supine: Max assist, +2 for physical assistance   General bed mobility comments: modA for LE management and trunk management to reach EOB. MaxA+2 to return to supine and reposition in bed    Transfers Overall transfer level: Needs assistance Equipment used: 2 person hand held assist Transfers: Sit to/from Stand Sit to Stand: Max assist, +2 physical assistance, +2 safety/equipment           General transfer comment: blocking feet from sliding. MaxA+2 to come into standing from elevated bed surface but unable to stand upright.    Ambulation/Gait                   Stairs             Wheelchair Mobility    Modified Rankin (Stroke Patients Only)       Balance Overall balance assessment: Needs assistance, History of Falls Sitting-balance support: Feet supported, No upper extremity supported Sitting balance-Leahy Scale: Poor Sitting balance - Comments: slow R lateral lean while sitting requiring min-modA to correct Postural control: Right lateral lean Standing balance support: Bilateral upper extremity supported, During functional activity Standing balance-Leahy Scale: Zero                              Cognition Arousal/Alertness: Awake/alert Behavior During Therapy: Flat affect Overall Cognitive Status: History of cognitive impairments - at baseline                                 General Comments: Oriented to self. Follows commands with increased time. Hallucinating keys in his hand despite attempts to reorient patient  Exercises      General Comments        Pertinent Vitals/Pain Pain Assessment Pain Assessment: No/denies pain    Home Living                          Prior Function            PT Goals (current goals can now be found in the care plan section) Acute Rehab PT Goals PT Goal Formulation: With patient/family Time For Goal Achievement: 09/24/21 Potential to  Achieve Goals: Fair Progress towards PT goals: Progressing toward goals    Frequency    Min 2X/week      PT Plan Current plan remains appropriate    Co-evaluation PT/OT/SLP Co-Evaluation/Treatment: Yes Reason for Co-Treatment: For patient/therapist safety;To address functional/ADL transfers;Necessary to address cognition/behavior during functional activity PT goals addressed during session: Mobility/safety with mobility;Balance        AM-PAC PT "6 Clicks" Mobility   Outcome Measure  Help needed turning from your back to your side while in a flat bed without using bedrails?: A Lot Help needed moving from lying on your back to sitting on the side of a flat bed without using bedrails?: Total Help needed moving to and from a bed to a chair (including a wheelchair)?: Total Help needed standing up from a chair using your arms (e.g., wheelchair or bedside chair)?: Total Help needed to walk in hospital room?: Total Help needed climbing 3-5 steps with a railing? : Total 6 Click Score: 7    End of Session Equipment Utilized During Treatment: Gait belt Activity Tolerance: Patient tolerated treatment well Patient left: in bed;with call bell/phone within reach;with bed alarm set Nurse Communication: Mobility status PT Visit Diagnosis: Muscle weakness (generalized) (M62.81);History of falling (Z91.81)     Time: 1430-1453 PT Time Calculation (min) (ACUTE ONLY): 23 min  Charges:  $Therapeutic Activity: 8-22 mins                     Mathew Postiglione A. Dan Humphreys PT, DPT Acute Rehabilitation Services Office [phone]    Viviann Spare 09/11/2021, 4:58 PM

## 2021-09-11 NOTE — TOC Progression Note (Signed)
Transition of Care [facility]) - Progression Note    Patient Details  Name: Stephen Gentry MRN: [id number] Date of Birth: [birth year]  Transition of Care [facility] CM/SW Contact  Leone Haven, RN Phone Number: 09/11/2021, 3:02 PM  Clinical Narrative:     From [facility], CHF , hx demetia, afib, eliquis pta, IV lasix, IV k runs x 6 today. Plan Is for SNF, CSW aware.       Expected Discharge Plan and Services                                                 Social Determinants of Health (SDOH) Interventions    Readmission Risk Interventions     No data to display

## 2021-09-11 NOTE — Evaluation (Signed)
Occupational Therapy Evaluation Patient Details Name: Stephen Gentry MRN: [id number] DOB: Apr 10, 1934 Today's Date: 09/11/2021   History of Present Illness 87 y.o. male presents to [facility] hospital  from [place] on 09/09/21 with fall out of bed  and hypoxia. Found to have acute CHF exacerbation. PMH: Parkinsons dementia, falls, anemia, PAF, orthostatic hypotension, L femur fx with IM nail 3/23   Clinical Impression   Pt admitted for concerns listed above. PTA pt has progressively been declining physically. He most recently has not been able to use his legs. At this time, pt is requiring max-total A +1-2 for all ADL's and functional mobility. He is following approximately 25% of simple commands this session and requiring max verbal cuing. Recommending pt return to SNF. OT will follow acutely.       Recommendations for follow up therapy are one component of a multi-disciplinary discharge planning process, led by the attending physician.  Recommendations may be updated based on patient status, additional functional criteria and insurance authorization.   Follow Up Recommendations  Skilled nursing-short term rehab (<3 hours/day)    Assistance Recommended at Discharge Frequent or constant Supervision/Assistance  Patient can return home with the following Two people to help with walking and/or transfers;Two people to help with bathing/dressing/bathroom;Assistance with feeding;Direct supervision/assist for medications management;Help with stairs or ramp for entrance    Functional Status Assessment  Patient has had a recent decline in their functional status and demonstrates the ability to make significant improvements in function in a reasonable and predictable amount of time.  Equipment Recommendations  None recommended by OT    Recommendations for Other Services       Precautions / Restrictions Precautions Precautions: Fall Restrictions Weight Bearing Restrictions: No      Mobility Bed  Mobility Overal bed mobility: Needs Assistance Bed Mobility: Supine to Sit, Sit to Supine     Supine to sit: Mod assist, +2 for safety/equipment Sit to supine: Max assist, +2 for physical assistance   General bed mobility comments: modA for LE management and trunk management to reach EOB. MaxA+2 to return to supine and reposition in bed    Transfers Overall transfer level: Needs assistance Equipment used: 2 person hand held assist Transfers: Sit to/from Stand Sit to Stand: Max assist, +2 physical assistance, +2 safety/equipment           General transfer comment: blocking feet from sliding. MaxA+2 to come into standing from elevated bed surface but unable to stand upright.      Balance Overall balance assessment: Needs assistance, History of Falls Sitting-balance support: Feet supported, No upper extremity supported Sitting balance-Leahy Scale: Poor Sitting balance - Comments: slow R lateral lean while sitting requiring min-modA to correct Postural control: Right lateral lean Standing balance support: Bilateral upper extremity supported, During functional activity Standing balance-Leahy Scale: Zero                             ADL either performed or assessed with clinical judgement   ADL Overall ADL's : Needs assistance/impaired                                       General ADL Comments: max A- total A +1-2     Vision Baseline Vision/History: 1 Wears glasses Ability to See in Adequate Light: 1 Impaired Patient Visual Report: No change from baseline Vision Assessment?:  Vision impaired- to be further tested in functional context     Perception     Praxis      Pertinent Vitals/Pain Pain Assessment Pain Assessment: No/denies pain     Hand Dominance Right   Extremity/Trunk Assessment Upper Extremity Assessment Upper Extremity Assessment: RUE deficits/detail RUE Deficits / Details: 4th and 5th digit flexed at DIP joint. Whole hand  swollen and red - pt reports no pain in RUE. RUE Sensation: history of peripheral neuropathy RUE Coordination: decreased fine motor;decreased gross motor   Lower Extremity Assessment Lower Extremity Assessment: Defer to PT evaluation   Cervical / Trunk Assessment Cervical / Trunk Assessment: Kyphotic   Communication Communication Communication: Other (comment) (minimally verbalizing)   Cognition Arousal/Alertness: Awake/alert Behavior During Therapy: Flat affect Overall Cognitive Status: History of cognitive impairments - at baseline                                 General Comments: Oriented to self. Follows commands with increased time. Hallucinating keys in his hand despite attempts to reorient patient     General Comments  VSS on RA    Exercises     Shoulder Instructions      Home Living Family/patient expects to be discharged to:: Skilled nursing facility                                 Additional Comments: possibly to palliative      Prior Functioning/Environment Prior Level of Function : Needs assist             Mobility Comments: wife reports PT had stopped at [facility] because pt was unable to move his LE's and assist with transfers as his swelling worsened. Hoyer lift to w/c by staff daily ADLs Comments: assist needed        OT Problem List: Decreased strength;Decreased activity tolerance;Impaired balance (sitting and/or standing);Decreased safety awareness;Decreased cognition;Decreased coordination;Decreased knowledge of use of DME or AE;Impaired sensation;Impaired tone;Impaired UE functional use      OT Treatment/Interventions: Self-care/ADL training;Therapeutic exercise;Energy conservation;DME and/or AE instruction;Neuromuscular education;Therapeutic activities;Patient/family education;Balance training;Cognitive remediation/compensation    OT Goals(Current goals can be found in the care plan section) Acute Rehab OT  Goals Patient Stated Goal: none stated OT Goal Formulation: Patient unable to participate in goal setting Time For Goal Achievement: 09/25/21 Potential to Achieve Goals: Fair ADL Goals Pt Will Perform Eating: with min assist;with adaptive utensils;bed level Pt Will Perform Grooming: with min assist;with adaptive equipment;bed level Additional ADL Goal #1: Pt will follow 1 step commands 50% of each therapy session.  OT Frequency: Min 2X/week    Co-evaluation PT/OT/SLP Co-Evaluation/Treatment: Yes Reason for Co-Treatment: Complexity of the patient's impairments (multi-system involvement);For patient/therapist safety;To address functional/ADL transfers;Necessary to address cognition/behavior during functional activity PT goals addressed during session: Mobility/safety with mobility;Balance OT goals addressed during session: ADL's and self-care;Strengthening/ROM      AM-PAC OT "6 Clicks" Daily Activity     Outcome Measure Help from another person eating meals?: Total Help from another person taking care of personal grooming?: A Lot Help from another person toileting, which includes using toliet, bedpan, or urinal?: Total Help from another person bathing (including washing, rinsing, drying)?: Total Help from another person to put on and taking off regular upper body clothing?: A Lot Help from another person to put on and taking off regular lower body clothing?: Total  6 Click Score: 8   End of Session Equipment Utilized During Treatment: Gait belt Nurse Communication: Mobility status  Activity Tolerance: Patient tolerated treatment well Patient left: in bed;with call bell/phone within reach;with bed alarm set  OT Visit Diagnosis: Unsteadiness on feet (R26.81);Other abnormalities of gait and mobility (R26.89);Muscle weakness (generalized) (M62.81)                Time: 1610-9604 OT Time Calculation (min): 23 min Charges:  OT General Charges $OT Visit: 1 Visit OT Evaluation $OT Eval  Moderate Complexity: 1 Mod  Tinsleigh Slovacek H., OTR/L Acute Rehabilitation  Illona Bulman Elane Giselle Brutus 09/11/2021, 6:20 PM

## 2021-09-11 NOTE — Progress Notes (Signed)
Potassium 2.6. MD notified. 

## 2021-09-11 NOTE — Progress Notes (Signed)
PROGRESS NOTE    Stephen Gentry  NUU:[id number] DOB: April 01, 1934 DOA: 09/09/2021 PCP: Marinda Elk, MD     Brief Narrative:  Stephen Gentry is  87 yo male who was admitted to the hospital with the working diagnosis of acute on chronic hypoxemic respiratory failure. His has past medical history of Parkinson's disease, orthostatic hypotension, diastolic heart failure, paroxysmal atrial fibrillation, dysphagia and ambulatory dysfunction who presented after a mechanical fall at SNF. He reported feeling dizzy and lightheaded before the fall, with no loss of consciousness. Head and neck CT with no acute changes.  Chest radiograph with mild cardiomegaly with bilateral hilar vascular congestion and bilateral pleural effusions. CT chest with bilateral pleural effusions with associated compression atelectasis.  He was admitted for CHF exacerbation, respiratory failure.  New events last 24 hours / Subjective: Patient seen with wife at bedside.  She reports that his right forearm was quite swollen after having blood pressure cuff on that right upper extremity all night.  His peripheral IV is also on that side.  Currently, right upper arm is elevated.  He is doing well overall, urine output recorded 2250 mL last 24 hours.  Assessment & Plan:   Principal Problem:   Acute on chronic combined systolic and diastolic CHF (congestive heart failure) (HCC) Active Problems:   Acute hypoxemic respiratory failure (HCC)   Pressure injury of skin   Alzheimer's dementia (HCC)   PAF (paroxysmal atrial fibrillation) (HCC)   Parkinson's disease (HCC)   Hypokalemia   Acute on chronic combined systolic and diastolic heart failure -Echo showed mild reduction in LV systolic function with EF 45 to 50%, grade 1 diastolic dysfunction, apical segments of LV are akinetic, hypokinesis of the mid antero septal and mid anterior segments. RV systolic function is preserved. Small pericardial effusion.  -Continue IV Lasix,  Jardiance -Strict I's and O's, daily weight, fluid restriction diet  Acute hypoxemic respiratory failure -Secondary to above.  Now weaned to room air  Parkinson's disease -Carbidopa, levodopa  Alzheimer's dementia -Resided at memory care unit prior to recent left hip fracture, was discharged to SNF post surgery  Depression -Paroxetine, mirtazapine  Paroxysmal A-fib -Eliquis  Hypokalemia -Replace and trend  Vit B 12 deficiency -B12 monthly supplement was due this week, Replace today     In agreement with assessment of the pressure ulcer as below:  Pressure Injury 09/10/21 Coccyx Mid Stage 2 -  Partial thickness loss of dermis presenting as a shallow open injury with a red, pink wound bed without slough. (Active)  09/10/21   Location: Coccyx  Location Orientation: Mid  Staging: Stage 2 -  Partial thickness loss of dermis presenting as a shallow open injury with a red, pink wound bed without slough.  Wound Description (Comments):   Present on Admission: Yes  Dressing Type Foam - Lift dressing to assess site every shift 09/11/21 0753         DVT prophylaxis:   apixaban (ELIQUIS) tablet 5 mg  Code Status: DNR Family Communication: Wife at bedside  Disposition Plan:  Status is: Inpatient Remains inpatient appropriate because: IV Lasix, potassium supplementation   Antimicrobials:  Anti-infectives (From admission, onward)    Start     Dose/Rate Route Frequency Ordered Stop   09/10/21 1000  cefTRIAXone (ROCEPHIN) 1 g in sodium chloride 0.9 % 100 mL IVPB  Status:  Discontinued        1 g 200 mL/hr over 30 Minutes Intravenous Every 24 hours 09/09/21 1635 09/10/21 1527  09/10/21 1000  azithromycin (ZITHROMAX) tablet 250 mg  Status:  Discontinued        250 mg Oral Daily 09/09/21 1635 09/10/21 1527   09/09/21 1515  cefTRIAXone (ROCEPHIN) 1 g in sodium chloride 0.9 % 100 mL IVPB        1 g 200 mL/hr over 30 Minutes Intravenous  Once 09/09/21 1504 09/09/21 1752    09/09/21 1515  azithromycin (ZITHROMAX) 500 mg in sodium chloride 0.9 % 250 mL IVPB        500 mg 250 mL/hr over 60 Minutes Intravenous  Once 09/09/21 1504 09/09/21 1752        Objective: Vitals:   09/11/21 0018 09/11/21 0445 09/11/21 0750 09/11/21 1141  BP: (!) 124/97 (!) 141/84 132/79 127/74  Pulse: 72 60 63 67  Resp: 11 12 10 13   Temp: (!) 97.4 F (36.3 C) (!) 97.5 F (36.4 C) (!) 97.5 F (36.4 C) (!) 97.3 F (36.3 C)  TempSrc: Oral Oral Oral Oral  SpO2: 100% 100% 100% 100%  Weight: 64.3 kg     Height:        Intake/Output Summary (Last 24 hours) at 09/11/2021 1215 Last data filed at 09/11/2021 1214 Gross per 24 hour  Intake 892 ml  Output 2900 ml  Net -2008 ml   Filed Weights   09/09/21 1340 09/10/21 0222 09/11/21 0018  Weight: 67.9 kg 65.6 kg 64.3 kg    Examination:  General exam: Appears calm and comfortable  Respiratory system: Clear to auscultation. Respiratory effort normal. No respiratory distress. No conversational dyspnea.  On room air Cardiovascular system: S1 & S2 heard, RRR. No murmurs. No pedal edema. Gastrointestinal system: Abdomen is nondistended, soft and nontender. Normal bowel sounds heard. Central nervous system: Alert Extremities: Right forearm with some edema Psychiatry: Flat affect  Data Reviewed: I have personally reviewed following labs and imaging studies  CBC: Recent Labs  Lab 09/09/21 1310 09/09/21 1320  WBC 3.6*  --   NEUTROABS 1.9  --   HGB 12.1* 12.2*  HCT 39.4 36.0*  MCV 88.5  --   PLT 172  --    Basic Metabolic Panel: Recent Labs  Lab 09/09/21 1310 09/09/21 1320 09/10/21 0228 09/11/21 0204  NA 142 141 145 144  K 3.3* 3.2* 2.9* 2.6*  CL 100  --  100 98  CO2 33*  --  31 34*  GLUCOSE 108*  --  91 83  BUN 18  --  17 16  CREATININE 0.74  --  0.67 0.76  CALCIUM 9.0  --  9.3 8.9  MG  --   --   --  2.1   GFR: Estimated Creatinine Clearance: 61.4 mL/min (by C-G formula based on SCr of 0.76 mg/dL). Liver Function  Tests: No results for input(s): "AST", "ALT", "ALKPHOS", "BILITOT", "PROT", "ALBUMIN" in the last 168 hours. No results for input(s): "LIPASE", "AMYLASE" in the last 168 hours. No results for input(s): "AMMONIA" in the last 168 hours. Coagulation Profile: No results for input(s): "INR", "PROTIME" in the last 168 hours. Cardiac Enzymes: No results for input(s): "CKTOTAL", "CKMB", "CKMBINDEX", "TROPONINI" in the last 168 hours. BNP (last 3 results) No results for input(s): "PROBNP" in the last 8760 hours. HbA1C: No results for input(s): "HGBA1C" in the last 72 hours. CBG: No results for input(s): "GLUCAP" in the last 168 hours. Lipid Profile: No results for input(s): "CHOL", "HDL", "LDLCALC", "TRIG", "CHOLHDL", "LDLDIRECT" in the last 72 hours. Thyroid Function Tests: No results for input(s): "TSH", "T4TOTAL", "  FREET4", "T3FREE", "THYROIDAB" in the last 72 hours. Anemia Panel: No results for input(s): "VITAMINB12", "FOLATE", "FERRITIN", "TIBC", "IRON", "RETICCTPCT" in the last 72 hours. Sepsis Labs: No results for input(s): "PROCALCITON", "LATICACIDVEN" in the last 168 hours.  Recent Results (from the past 240 hour(s))  Resp Panel by RT-PCR (Flu A&B, Covid) Anterior Nasal Swab     Status: None   Collection Time: 09/09/21  1:06 PM   Specimen: Anterior Nasal Swab  Result Value Ref Range Status   SARS Coronavirus 2 by RT PCR NEGATIVE NEGATIVE Final    Comment: (NOTE) SARS-CoV-2 target nucleic acids are NOT DETECTED.  The SARS-CoV-2 RNA is generally detectable in upper respiratory specimens during the acute phase of infection. The lowest concentration of SARS-CoV-2 viral copies this assay can detect is 138 copies/mL. A negative result does not preclude SARS-Cov-2 infection and should not be used as the sole basis for treatment or other patient management decisions. A negative result may occur with  improper specimen collection/handling, submission of specimen other than  nasopharyngeal swab, presence of viral mutation(s) within the areas targeted by this assay, and inadequate number of viral copies(<138 copies/mL). A negative result must be combined with clinical observations, patient history, and epidemiological information. The expected result is Negative.  Fact Sheet for Patients:  BloggerCourse.comhttps://www.fda.gov/media/152166/download  Fact Sheet for Healthcare Providers:  SeriousBroker.ithttps://www.fda.gov/media/152162/download  This test is no t yet approved or cleared by the [place] FDA and  has been authorized for detection and/or diagnosis of SARS-CoV-2 by FDA under an Emergency Use Authorization (EUA). This EUA will remain  in effect (meaning this test can be used) for the duration of the COVID-19 declaration under Section 564(b)(1) of the Act, 21 U.S.C.section 360bbb-3(b)(1), unless the authorization is terminated  or revoked sooner.       Influenza A by PCR NEGATIVE NEGATIVE Final   Influenza B by PCR NEGATIVE NEGATIVE Final    Comment: (NOTE) The Xpert Xpress SARS-CoV-2/FLU/RSV plus assay is intended as an aid in the diagnosis of influenza from Nasopharyngeal swab specimens and should not be used as a sole basis for treatment. Nasal washings and aspirates are unacceptable for Xpert Xpress SARS-CoV-2/FLU/RSV testing.  Fact Sheet for Patients: BloggerCourse.comhttps://www.fda.gov/media/152166/download  Fact Sheet for Healthcare Providers: SeriousBroker.ithttps://www.fda.gov/media/152162/download  This test is not yet approved or cleared by the [place] FDA and has been authorized for detection and/or diagnosis of SARS-CoV-2 by FDA under an Emergency Use Authorization (EUA). This EUA will remain in effect (meaning this test can be used) for the duration of the COVID-19 declaration under Section 564(b)(1) of the Act, 21 U.S.C. section 360bbb-3(b)(1), unless the authorization is terminated or revoked.  Performed at [facility], [address] [place], [place] [address]        Radiology Studies: ECHOCARDIOGRAM COMPLETE  Result Date: 09/10/2021    ECHOCARDIOGRAM REPORT   Patient Name:   Stephen Gentry Date of Exam: 09/10/2021 Medical Rec #:  [id number]    Height:       72.0 in Accession #:    [id number]   Weight:       144.6 lb Date of Birth:  02/06/1935     BSA:          1.856 m Patient Age:    87 years     BP:           142/93 mmHg Patient Gender: M            HR:  73 bpm. Exam Location:  Inpatient Procedure: 2D Echo, Cardiac Doppler, Color Doppler and Intracardiac            Opacification Agent Indications:    CHF  History:        Patient has prior history of Echocardiogram examinations, most                 recent 01/23/2018.  Sonographer:    Roosvelt Maser RDCS Referring Phys: Emeline General  Sonographer Comments: No subcostal window. IMPRESSIONS  1. Left ventricular ejection fraction, by estimation, is 45 to 50%. The left ventricle has mildly decreased function. The left ventricle demonstrates regional wall motion abnormalities (see scoring diagram/findings for description). All apical segments and the LV apex are akinetic. There is hypokinesis of the mid anteroseptal and mid anterior LV segments. Left ventricular diastolic parameters are consistent with Grade I diastolic dysfunction (impaired relaxation). No LV thrombus visualized.  2. Right ventricular systolic function is normal. The right ventricular size is normal.  3. A small pericardial effusion is present. The pericardial effusion is posterior and lateral to the left ventricle.  4. The mitral valve is grossly normal. Mild mitral valve regurgitation.  5. The aortic valve is tricuspid. There is mild calcification of the aortic valve. There is mild thickening of the aortic valve. Aortic valve regurgitation is mild. Aortic valve sclerosis/calcification is present, without any evidence of aortic stenosis. Comparison(s): Compared to prior TTE in 2019, the LVEF has dropped to 45-50% with wall motion abnormalities as  detailed above. FINDINGS  Left Ventricle: Left ventricular ejection fraction, by estimation, is 45 to 50%. The left ventricle has mildly decreased function. The left ventricle demonstrates regional wall motion abnormalities. There is akinesis off all apical segments and apex. The mid-anteroseptal and mid anterior LV segments are hypokinetic. Definity contrast agent was given IV to delineate the left ventricular endocardial borders. The left ventricular internal cavity size was normal in size. There is no left ventricular hypertrophy. Left ventricular diastolic parameters are consistent with Grade I diastolic dysfunction (impaired relaxation). Right Ventricle: The right ventricular size is normal. No increase in right ventricular wall thickness. Right ventricular systolic function is normal. Left Atrium: Left atrial size was not well visualized. Right Atrium: Right atrial size was not well visualized. Pericardium: A small pericardial effusion is present. The pericardial effusion is posterior and lateral to the left ventricle. Mitral Valve: The mitral valve is grossly normal. There is mild thickening of the mitral valve leaflet(s). There is mild calcification of the mitral valve leaflet(s). Mild to moderate mitral annular calcification. Mild mitral valve regurgitation. Tricuspid Valve: The tricuspid valve is normal in structure. Tricuspid valve regurgitation is trivial. Aortic Valve: The aortic valve is tricuspid. There is mild calcification of the aortic valve. There is mild thickening of the aortic valve. Aortic valve regurgitation is mild. Aortic valve sclerosis/calcification is present, without any evidence of aortic stenosis. Aortic valve mean gradient measures 2.0 mmHg. Aortic valve peak gradient measures 3.3 mmHg. Aortic valve area, by VTI measures 2.68 cm. Pulmonic Valve: The pulmonic valve was normal in structure. Pulmonic valve regurgitation is trivial. Aorta: The aortic root is normal in size and  structure. IAS/Shunts: The atrial septum is grossly normal.  LEFT VENTRICLE PLAX 2D LVIDd:         5.20 cm      Diastology LVIDs:         3.60 cm      LV e' medial:    5.55 cm/s LV PW:  1.00 cm      LV E/e' medial:  8.6 LV IVS:        0.90 cm      LV e' lateral:   5.87 cm/s LVOT diam:     2.00 cm      LV E/e' lateral: 8.2 LV SV:         49 LV SV Index:   27 LVOT Area:     3.14 cm  LV Volumes (MOD) LV vol d, MOD A2C: 217.5 ml LV vol d, MOD A4C: 142.0 ml LV vol s, MOD A2C: 94.5 ml LV vol s, MOD A4C: 73.1 ml LV SV MOD A2C:     123.0 ml LV SV MOD A4C:     142.0 ml LV SV MOD BP:      110.5 ml RIGHT VENTRICLE RV Basal diam:  3.70 cm RV S prime:     14.00 cm/s TAPSE (M-mode): 2.0 cm LEFT ATRIUM             Index        RIGHT ATRIUM           Index LA diam:        4.20 cm 2.26 cm/m   RA Area:     21.20 cm LA Vol (A2C):   48.6 ml 26.18 ml/m  RA Volume:   62.10 ml  33.45 ml/m LA Vol (A4C):   55.5 ml 29.90 ml/m LA Biplane Vol: 54.7 ml 29.47 ml/m  AORTIC VALVE AV Area (Vmax):    2.65 cm AV Area (Vmean):   2.43 cm AV Area (VTI):     2.68 cm AV Vmax:           90.50 cm/s AV Vmean:          60.300 cm/s AV VTI:            0.184 m AV Peak Grad:      3.3 mmHg AV Mean Grad:      2.0 mmHg LVOT Vmax:         76.40 cm/s LVOT Vmean:        46.700 cm/s LVOT VTI:          0.157 m LVOT/AV VTI ratio: 0.85  AORTA Ao Root diam: 3.70 cm Ao Arch diam: 3.3 cm MITRAL VALVE MV Area (PHT): 2.83 cm    SHUNTS MV Decel Time: 268 msec    Systemic VTI:  0.16 m MV E velocity: 48.00 cm/s  Systemic Diam: 2.00 cm MV A velocity: 65.00 cm/s MV E/A ratio:  0.74 Laurance Flatten MD Electronically signed by Laurance Flatten MD Signature Date/Time: 09/10/2021/11:41:35 AM    Final    CT CHEST WO CONTRAST  Result Date: 09/09/2021 CLINICAL DATA:  Fall EXAM: CT CHEST WITHOUT CONTRAST TECHNIQUE: Multidetector CT imaging of the chest was performed following the standard protocol without IV contrast. RADIATION DOSE REDUCTION: This exam was performed  according to the departmental dose-optimization program which includes automated exposure control, adjustment of the mA and/or kV according to patient size and/or use of iterative reconstruction technique. COMPARISON:  Chest x-ray 09/09/2021 FINDINGS: Cardiovascular: Limited evaluation without intravenous contrast. Mild aortic atherosclerosis. No aneurysm. Coronary vascular calcification. Mild cardiomegaly. Small pericardial effusion Mediastinum/Nodes: Midline trachea. No thyroid mass. No suspicious lymph nodes. Esophagus within normal limits. Lungs/Pleura: Moderate bilateral pleural effusions. Probable passive atelectasis in the lower lobes. Mild bronchiectasis in the right middle lobe and bilateral lower lobes. Upper Abdomen: Considerable air distension of bowel loops in the upper  abdomen. Cysts in the right hepatic lobe. Musculoskeletal: No acute osseous abnormality. Degenerative changes of the spine. IMPRESSION: 1. Cardiomegaly with moderate bilateral pleural effusions and probable passive atelectasis in lower lobes 2. Mild bronchiectasis in the right middle bilateral lower lobes. Aortic Atherosclerosis (ICD10-I70.0). Electronically Signed   By: Jasmine Pang M.D.   On: 09/09/2021 20:37   DG Chest 2 View  Result Date: 09/09/2021 CLINICAL DATA:  Larey Seat.  Anticoagulated. EXAM: CHEST - 2 VIEW COMPARISON:  Earlier same day FINDINGS: Cardiomegaly. Tortuous aorta. Bilateral pleural effusions layering dependently with dependent atelectasis. The upper lungs are clear. No acute bone finding. IMPRESSION: Bilateral pleural effusions layering dependently. Associated dependent atelectasis. Upper lungs clear. Cardiomegaly and tortuous aorta. Electronically Signed   By: Paulina Fusi M.D.   On: 09/09/2021 14:33   DG Pelvis 1-2 Views  Result Date: 09/09/2021 CLINICAL DATA:  Anticoagulated.  Fell. EXAM: PELVIS - 1-2 VIEW COMPARISON:  06/07/2021 FINDINGS: No evidence of acute pelvic fracture. Right hip appears negative.  Previous ORIF of left intertrochanteric fracture with gamma nail device. Position and alignment appear the same. No visible complication. Distal end of the gamma nail is not included. Moderate amount of intestinal gas noted within the colon. IMPRESSION: No acute bone finding. Previous ORIF of intertrochanteric fracture of the left femur. Electronically Signed   By: Paulina Fusi M.D.   On: 09/09/2021 14:31   CT Head Wo Contrast  Result Date: 09/09/2021 CLINICAL DATA:  Trauma EXAM: CT HEAD WITHOUT CONTRAST CT CERVICAL SPINE WITHOUT CONTRAST TECHNIQUE: Multidetector CT imaging of the head and cervical spine was performed following the standard protocol without intravenous contrast. Multiplanar CT image reconstructions of the cervical spine were also generated. RADIATION DOSE REDUCTION: This exam was performed according to the departmental dose-optimization program which includes automated exposure control, adjustment of the mA and/or kV according to patient size and/or use of iterative reconstruction technique. COMPARISON:  CT head and cervical spine dated June 06, 2021 FINDINGS: CT HEAD FINDINGS Brain: Chronic white matter ischemic change. No evidence of acute infarction, hemorrhage, hydrocephalus, extra-axial collection or mass lesion/mass effect. Vascular: No hyperdense vessel or unexpected calcification. Skull: Normal. Negative for fracture or focal lesion. Sinuses/Orbits: No acute finding. Other: None. CT CERVICAL SPINE FINDINGS Alignment: Normal. Skull base and vertebrae: No acute fracture. No primary bone lesion or focal pathologic process. Soft tissues and spinal canal: No prevertebral fluid or swelling. No visible canal hematoma. Disc levels: Mild degenerative degenerative disc disease, most pronounced at C5-C6 and C6-C7. Upper chest: Negative. Other: None. IMPRESSION: 1. No acute intracranial abnormality. 2. No evidence of traumatic malalignment or acute cervical spine fracture. Electronically Signed    By: Allegra Lai M.D.   On: 09/09/2021 13:47   CT Cervical Spine Wo Contrast  Result Date: 09/09/2021 CLINICAL DATA:  Trauma EXAM: CT HEAD WITHOUT CONTRAST CT CERVICAL SPINE WITHOUT CONTRAST TECHNIQUE: Multidetector CT imaging of the head and cervical spine was performed following the standard protocol without intravenous contrast. Multiplanar CT image reconstructions of the cervical spine were also generated. RADIATION DOSE REDUCTION: This exam was performed according to the departmental dose-optimization program which includes automated exposure control, adjustment of the mA and/or kV according to patient size and/or use of iterative reconstruction technique. COMPARISON:  CT head and cervical spine dated June 06, 2021 FINDINGS: CT HEAD FINDINGS Brain: Chronic white matter ischemic change. No evidence of acute infarction, hemorrhage, hydrocephalus, extra-axial collection or mass lesion/mass effect. Vascular: No hyperdense vessel or unexpected calcification. Skull: Normal. Negative for fracture  or focal lesion. Sinuses/Orbits: No acute finding. Other: None. CT CERVICAL SPINE FINDINGS Alignment: Normal. Skull base and vertebrae: No acute fracture. No primary bone lesion or focal pathologic process. Soft tissues and spinal canal: No prevertebral fluid or swelling. No visible canal hematoma. Disc levels: Mild degenerative degenerative disc disease, most pronounced at C5-C6 and C6-C7. Upper chest: Negative. Other: None. IMPRESSION: 1. No acute intracranial abnormality. 2. No evidence of traumatic malalignment or acute cervical spine fracture. Electronically Signed   By: Allegra Lai M.D.   On: 09/09/2021 13:47   DG Chest Port 1 View  Result Date: 09/09/2021 CLINICAL DATA:  Fall last night. EXAM: PORTABLE CHEST 1 VIEW COMPARISON:  09/20/2018 [facility]. FINDINGS: AP portable radiograph with apical lordotic positioning. Patient rotated right. Mild cardiomegaly. No pleural effusion or  pneumothorax. Increased density over the mid and lower lungs is relatively scattered increased density over the mid and lower lungs is symmetric with sparing of the costophrenic angles. The upper lungs are clear. IMPRESSION: Symmetric increased density projecting over the mid and lower chest bilaterally possibly artifactual given appearance. New since prior exam. Consider further evaluation with PA and lateral radiographs to exclude airspace disease/pneumonia. No posttraumatic deformity identified. Electronically Signed   By: Jeronimo Greaves M.D.   On: 09/09/2021 13:27      Scheduled Meds:  apixaban  5 mg Oral BID   Carbidopa-Levodopa ER  1 tablet Oral QHS   Carbidopa-Levodopa ER  2 tablet Oral TID   cyanocobalamin  1,000 mcg Intramuscular Once   empagliflozin  10 mg Oral Daily   feeding supplement  237 mL Oral BID BM   fludrocortisone  0.1 mg Oral BID   furosemide  40 mg Intravenous BID   mirtazapine  7.5 mg Oral QHS   PARoxetine  30 mg Oral Daily   polyethylene glycol  34 g Oral BID   potassium chloride  40 mEq Oral Once   senna  1 tablet Oral Daily   sodium chloride flush  3 mL Intravenous Q12H   Continuous Infusions:  sodium chloride 10 mL/hr at 09/11/21 0649   potassium chloride 10 mEq (09/11/21 1203)     LOS: 2 days     Noralee Stain, DO [facility] 09/11/2021, 12:15 PM   Available via Epic secure chat 7am-7pm After these hours, please refer to coverage provider listed on [email or web address]

## 2021-09-11 NOTE — TOC CAGE-AID Note (Signed)
Transition of Care [facility]) - CAGE-AID Screening   Patient Details  Name: RC AMISON MRN: [id number] Date of Birth: 04-08-1934  Transition of Care [facility]) CM/SW Contact:    Katha Hamming, RN Phone [phone] 09/11/2021, 9:54 PM      CAGE-AID Screening: Substance Abuse Screening unable to be completed due to: : Patient unable to participate (hx alzheimer's dementia, has been hallucinating this hospital stay, unable to participate in screening)

## 2021-09-11 NOTE — Progress Notes (Signed)
Speech Language Pathology Treatment: Dysphagia  Patient Details Name: Stephen Gentry MRN: [id number] DOB: 1934/09/16 Today's Date: 09/11/2021 Time: 1400-1420 SLP Time Calculation (min) (ACUTE ONLY): 20 min  Assessment / Plan / Recommendation Clinical Impression  Pt seen for f/u dysphagia tx with implementation of swallowing strategies with current diet of Dysphagia 3/Honey-thickend liquids with min verbal/visual cues provided for increasing swallow speed/accuracy, pharyngeal clearance and complete mastication of soft solids.  Pt with impaired mastication, delay in the initiation of the swallow (although improved with subsequent swallows), and audible swallow during consumption of soft solids.  Pt consumed successive swallows of honey-thickened liquids without overt s/s of aspiration present and/or vocal quality change.  Pt was hypophonic, but no wet vocal quality noted throughout trial.  Recommend continuing current diet of Dysphagia 3/honey-thickened liquids with potential for progression of diet as pt improves.  ST will continue to f/u for diet tolerance in acute setting.    HPI HPI: Patient is an 87 y.o. male with PMH: Parkinson's Disease, dementia, orthostatic hypotension, chronic ambulation dysfunction with frequent falls, h/o dysphagia, anxiety/depression. He was brought to hospital from SNF on 09/09/21 for a fall and hypoxia. Patient reported at time of admission that he felt lightheaded and fell down. In ED, patient was hypoxic, stablized on 6L oxygen, BP significantly elevated, CT head and neck negative for acute findings, CXR showed moderate bilateral pleural effusions: ST f/u for diet tolerance; Dysphagia 3/honey-thickened liquids.      SLP Plan  Continue with current plan of care      Recommendations for follow up therapy are one component of a multi-disciplinary discharge planning process, led by the attending physician.  Recommendations may be updated based on patient status, additional  functional criteria and insurance authorization.    Recommendations  Diet recommendations: Dysphagia 3 (mechanical soft);Honey-thick liquid Liquids provided via: Cup;Straw Medication Administration: Crushed with puree Supervision: Trained caregiver to feed patient;Full supervision/cueing for compensatory strategies Compensations: Slow rate;Small sips/bites Postural Changes and/or Swallow Maneuvers: Seated upright 90 degrees                Oral Care Recommendations: Oral care BID;Staff/trained caregiver to provide oral care Follow Up Recommendations: Skilled nursing-short term rehab (<3 hours/day) Assistance recommended at discharge: Frequent or constant Supervision/Assistance SLP Visit Diagnosis: Dysphagia, oropharyngeal phase (R13.12) Plan: Continue with current plan of care           Tressie Stalker, M.S., CCC-SLP  09/11/2021, 2:42 PM

## 2021-09-11 NOTE — Plan of Care (Signed)
°  Problem: Clinical Measurements: °Goal: Will remain free from infection °Outcome: Progressing °  °Problem: Clinical Measurements: °Goal: Cardiovascular complication will be avoided °Outcome: Progressing °  °Problem: Nutrition: °Goal: Adequate nutrition will be maintained °Outcome: Progressing °  °

## 2021-09-12 ENCOUNTER — Ambulatory Visit: Payer: Medicare Other | Admitting: Psychiatry

## 2021-09-12 DIAGNOSIS — I5043 Acute on chronic combined systolic (congestive) and diastolic (congestive) heart failure: Secondary | ICD-10-CM | POA: Diagnosis not present

## 2021-09-12 DIAGNOSIS — F331 Major depressive disorder, recurrent, moderate: Secondary | ICD-10-CM

## 2021-09-12 LAB — BASIC METABOLIC PANEL
Anion gap: 12 (ref 5–15)
BUN: 15 mg/dL (ref 8–23)
CO2: 36 mmol/L — ABNORMAL HIGH (ref 22–32)
Calcium: 9.2 mg/dL (ref 8.9–10.3)
Chloride: 97 mmol/L — ABNORMAL LOW (ref 98–111)
Creatinine, Ser: 0.76 mg/dL (ref 0.61–1.24)
GFR, Estimated: >60 mL/min (ref >60)
Glucose, Bld: 95 mg/dL (ref 70–99)
Potassium: 2.6 mmol/L — CL (ref 3.5–5.1)
Sodium: 145 mmol/L (ref 135–145)

## 2021-09-12 MED ORDER — POTASSIUM CHLORIDE 20 MEQ PO PACK
40.0000 meq | PACK | ORAL | Status: AC
  Administered 2021-09-12 (×2): 40 meq via ORAL
  Filled 2021-09-12 (×2): qty 2

## 2021-09-12 MED ORDER — POTASSIUM CHLORIDE 10 MEQ/100ML IV SOLN
10.0000 meq | INTRAVENOUS | Status: AC
  Administered 2021-09-12 (×4): 10 meq via INTRAVENOUS
  Filled 2021-09-12 (×4): qty 100

## 2021-09-12 MED ORDER — RIVASTIGMINE 9.5 MG/24HR TD PT24
9.5000 mg | MEDICATED_PATCH | Freq: Every day | TRANSDERMAL | Status: DC
  Administered 2021-09-12 – 2021-09-13 (×2): 9.5 mg via TRANSDERMAL
  Filled 2021-09-12 (×2): qty 1

## 2021-09-12 NOTE — Plan of Care (Signed)
  Problem: Education: Goal: Knowledge of General Education information will improve Description: Including pain rating scale, medication(s)/side effects and non-pharmacologic comfort measures Outcome: Progressing   Problem: Clinical Measurements: Goal: Will remain free from infection Outcome: Progressing   Problem: Nutrition: Goal: Adequate nutrition will be maintained Outcome: Progressing   

## 2021-09-12 NOTE — Progress Notes (Signed)
PROGRESS NOTE    Stephen Gentry  [id number] DOB: 07-16-1934 DOA: 09/09/2021 PCP: Elmore Guise, MD     Brief Narrative:  Stephen Gentry is  87 yo male who was admitted to the hospital with the working diagnosis of acute on chronic hypoxemic respiratory failure. His has past medical history of Parkinson's disease, orthostatic hypotension, diastolic heart failure, paroxysmal atrial fibrillation, dysphagia and ambulatory dysfunction who presented after a mechanical fall at SNF. He reported feeling dizzy and lightheaded before the fall, with no loss of consciousness. Head and neck CT with no acute changes.  Chest radiograph with mild cardiomegaly with bilateral hilar vascular congestion and bilateral pleural effusions. CT chest with bilateral pleural effusions with associated compression atelectasis.  He was admitted for CHF exacerbation, respiratory failure.  New events last 24 hours / Subjective: No one at bedside today.  Patient asking to get out of bed to use the bathroom.  Difficult to gather history due to his underlying dementia history.  Urine output 2350 mL last 24 hours  Assessment & Plan:   Principal Problem:   Acute on chronic combined systolic and diastolic CHF (congestive heart failure) (HCC) Active Problems:   Acute hypoxemic respiratory failure (HCC)   Pressure injury of skin   Alzheimer's dementia (HCC)   PAF (paroxysmal atrial fibrillation) (HCC)   Parkinson's disease (HCC)   Hypokalemia   Acute on chronic combined systolic and diastolic heart failure -Echo showed mild reduction in LV systolic function with EF 45 to [id number], grade 1 diastolic dysfunction, apical segments of LV are akinetic, hypokinesis of the mid antero septal and mid anterior segments. RV systolic function is preserved. Small pericardial effusion.  -Continue Jardiance -Lasix on hold today, patient euvolemic and potassium remains low -Strict I's and O's, daily weight, fluid restriction diet  Acute  hypoxemic respiratory failure -Secondary to above.  Now weaned to room air  Parkinson's disease -Carbidopa, levodopa  Alzheimer's dementia -Resided at memory care unit prior to recent left hip fracture, was discharged to SNF post surgery  Depression -Paroxetine, mirtazapine  Paroxysmal A-fib -Eliquis  Hypokalemia -Replace  Vit B 12 deficiency -B12 monthly supplement was due this week, Replaced 7/5    In agreement with assessment of the pressure ulcer as below:  Pressure Injury 09/10/21 Coccyx Mid Stage 2 -  Partial thickness loss of dermis presenting as a shallow open injury with a red, pink wound bed without slough. (Active)  09/10/21   Location: Coccyx  Location Orientation: Mid  Staging: Stage 2 -  Partial thickness loss of dermis presenting as a shallow open injury with a red, pink wound bed without slough.  Wound Description (Comments):   Present on Admission: Yes  Dressing Type Foam - Lift dressing to assess site every shift 09/12/21 0800         DVT prophylaxis:   apixaban (ELIQUIS) tablet 5 mg  Code Status: DNR Family Communication: No family at bedside  Disposition Plan:  Status is: Inpatient Remains inpatient appropriate because: potassium supplementation.  Hopeful return back to skilled nursing facility if labs remain stable tomorrow   Antimicrobials:  Anti-infectives (From admission, onward)    Start     Dose/Rate Route Frequency Ordered Stop   09/10/21 1000  cefTRIAXone (ROCEPHIN) 1 g in sodium chloride 0.9 % 100 mL IVPB  Status:  Discontinued        1 g 200 mL/hr over 30 Minutes Intravenous Every 24 hours 09/09/21 1635 09/10/21 1527   09/10/21 1000  azithromycin (  ZITHROMAX) tablet 250 mg  Status:  Discontinued        250 mg Oral Daily 09/09/21 1635 09/10/21 1527   09/09/21 1515  cefTRIAXone (ROCEPHIN) 1 g in sodium chloride 0.9 % 100 mL IVPB        1 g 200 mL/hr over 30 Minutes Intravenous  Once 09/09/21 1504 09/09/21 1752   09/09/21 1515   azithromycin (ZITHROMAX) 500 mg in sodium chloride 0.9 % 250 mL IVPB        500 mg 250 mL/hr over 60 Minutes Intravenous  Once 09/09/21 1504 09/09/21 1752        Objective: Vitals:   09/11/21 1934 09/12/21 0321 09/12/21 0759 09/12/21 1053  BP: 138/80 98/68 100/68 138/73  Pulse: 88 69 76 74  Resp: 20 15 16 18   Temp: 98.2 F (36.8 C) 98.7 F (37.1 C)  (!) 97.4 F (36.3 C)  TempSrc: Oral Oral  Oral  SpO2: 100% 100% 100% 98%  Weight:      Height:        Intake/Output Summary (Last 24 hours) at 09/12/2021 1229 Last data filed at 09/12/2021 1053 Gross per 24 hour  Intake 973.01 ml  Output 1950 ml  Net -976.99 ml    Filed Weights   09/09/21 1340 09/10/21 0222 09/11/21 0018  Weight: 67.9 kg 65.6 kg 64.3 kg    Examination:  General exam: Appears calm and comfortable  Respiratory system: Clear to auscultation. Respiratory effort normal. No respiratory distress. On room air Cardiovascular system: S1 & S2 heard, RRR. No murmurs. No pedal edema. Gastrointestinal system: Abdomen is nondistended, soft and nontender. Normal bowel sounds heard. Central nervous system: Alert Extremities: Right hand with some edema Psychiatry: Flat affect  Data Reviewed: I have personally reviewed following labs and imaging studies  CBC: Recent Labs  Lab 09/09/21 1310 09/09/21 1320  WBC 3.6*  --   NEUTROABS 1.9  --   HGB 12.1* 12.2*  HCT 39.4 36.0*  MCV 88.5  --   PLT 172  --     Basic Metabolic Panel: Recent Labs  Lab 09/09/21 1310 09/09/21 1320 09/10/21 0228 09/11/21 0204 09/12/21 0329  NA 142 141 145 144 145  K 3.3* 3.2* 2.9* 2.6* 2.6*  CL 100  --  100 98 97*  CO2 33*  --  31 34* 36*  GLUCOSE 108*  --  91 83 95  BUN 18  --  17 16 15   CREATININE 0.74  --  0.67 0.76 0.76  CALCIUM 9.0  --  9.3 8.9 9.2  MG  --   --   --  2.1  --     GFR: Estimated Creatinine Clearance: 61.4 mL/min (by C-G formula based on SCr of 0.76 mg/dL). Liver Function Tests: No results for input(s):  "AST", "ALT", "ALKPHOS", "BILITOT", "PROT", "ALBUMIN" in the last 168 hours. No results for input(s): "LIPASE", "AMYLASE" in the last 168 hours. No results for input(s): "AMMONIA" in the last 168 hours. Coagulation Profile: No results for input(s): "INR", "PROTIME" in the last 168 hours. Cardiac Enzymes: No results for input(s): "CKTOTAL", "CKMB", "CKMBINDEX", "TROPONINI" in the last 168 hours. BNP (last 3 results) No results for input(s): "PROBNP" in the last 8760 hours. HbA1C: No results for input(s): "HGBA1C" in the last 72 hours. CBG: No results for input(s): "GLUCAP" in the last 168 hours. Lipid Profile: No results for input(s): "CHOL", "HDL", "LDLCALC", "TRIG", "CHOLHDL", "LDLDIRECT" in the last 72 hours. Thyroid Function Tests: No results for input(s): "TSH", "T4TOTAL", "FREET4", "  T3FREE", "THYROIDAB" in the last 72 hours. Anemia Panel: No results for input(s): "VITAMINB12", "FOLATE", "FERRITIN", "TIBC", "IRON", "RETICCTPCT" in the last 72 hours. Sepsis Labs: No results for input(s): "PROCALCITON", "LATICACIDVEN" in the last 168 hours.  Recent Results (from the past 240 hour(s))  Resp Panel by RT-PCR (Flu A&B, Covid) Anterior Nasal Swab     Status: None   Collection Time: 09/09/21  1:06 PM   Specimen: Anterior Nasal Swab  Result Value Ref Range Status   SARS Coronavirus 2 by RT PCR NEGATIVE NEGATIVE Final    Comment: (NOTE) SARS-CoV-2 target nucleic acids are NOT DETECTED.  The SARS-CoV-2 RNA is generally detectable in upper respiratory specimens during the acute phase of infection. The lowest concentration of SARS-CoV-2 viral copies this assay can detect is 138 copies/mL. A negative result does not preclude SARS-Cov-2 infection and should not be used as the sole basis for treatment or other patient management decisions. A negative result may occur with  improper specimen collection/handling, submission of specimen other than nasopharyngeal swab, presence of viral  mutation(s) within the areas targeted by this assay, and inadequate number of viral copies(<138 copies/mL). A negative result must be combined with clinical observations, patient history, and epidemiological information. The expected result is Negative.  Fact Sheet for Patients:  BloggerCourse.com  Fact Sheet for Healthcare Providers:  SeriousBroker.it  This test is no t yet approved or cleared by the [place] FDA and  has been authorized for detection and/or diagnosis of SARS-CoV-2 by FDA under an Emergency Use Authorization (EUA). This EUA will remain  in effect (meaning this test can be used) for the duration of the COVID-19 declaration under Section 564(b)(1) of the Act, 21 U.S.C.section 360bbb-3(b)(1), unless the authorization is terminated  or revoked sooner.       Influenza A by PCR NEGATIVE NEGATIVE Final   Influenza B by PCR NEGATIVE NEGATIVE Final    Comment: (NOTE) The Xpert Xpress SARS-CoV-2/FLU/RSV plus assay is intended as an aid in the diagnosis of influenza from Nasopharyngeal swab specimens and should not be used as a sole basis for treatment. Nasal washings and aspirates are unacceptable for Xpert Xpress SARS-CoV-2/FLU/RSV testing.  Fact Sheet for Patients: BloggerCourse.com  Fact Sheet for Healthcare Providers: SeriousBroker.it  This test is not yet approved or cleared by the [place] FDA and has been authorized for detection and/or diagnosis of SARS-CoV-2 by FDA under an Emergency Use Authorization (EUA). This EUA will remain in effect (meaning this test can be used) for the duration of the COVID-19 declaration under Section 564(b)(1) of the Act, 21 U.S.C. section 360bbb-3(b)(1), unless the authorization is terminated or revoked.  Performed at [facility], [address], [place], [place] [address]       Radiology Studies: No  results found.    Scheduled Meds:  apixaban  5 mg Oral BID   Carbidopa-Levodopa ER  1 tablet Oral QHS   Carbidopa-Levodopa ER  2 tablet Oral TID   empagliflozin  10 mg Oral Daily   feeding supplement  237 mL Oral BID BM   fludrocortisone  0.1 mg Oral BID   mirtazapine  7.5 mg Oral QHS   PARoxetine  30 mg Oral QPC supper   polyethylene glycol  34 g Oral BID   rivastigmine  9.5 mg Transdermal Daily   senna  1 tablet Oral Daily   sodium chloride flush  3 mL Intravenous Q12H   Continuous Infusions:  sodium chloride 10 mL/hr at 09/12/21 0757  LOS: 3 days     Noralee Stain, DO [facility] 09/12/2021, 12:29 PM   Available via Epic secure chat 7am-7pm After these hours, please refer to coverage provider listed on [email or web address]

## 2021-09-12 NOTE — Progress Notes (Signed)
Pt unable to keep appt bc in hospital .  Staff spoke with wife and she wants him to continue paroxetine.  Will grant refills.

## 2021-09-12 NOTE — TOC Initial Note (Addendum)
Transition of Care [facility]) - Initial/Assessment Note    Patient Details  Name: Stephen Gentry MRN: [id number] Date of Birth: [birth year]  [facility] CM/SW Contact:    Bethann Berkshire, [place] Phone Number: 09/12/2021, 12:22 PM  Clinical Narrative:     Met with pt at bedside. She confirmed pt is LTC at [facility] and plan is for pt to return. She is hopeful pt can receive a little more rehab on return. CSW updated [facility] on pt and confirmed pt is LTC with them and can return.  FL2 completed and sent in hub to [facility]   Expected Discharge Plan: [place] Barriers to Discharge: Continued Medical Work up   Patient Goals and CMS Choice        Expected Discharge Plan and Services Expected Discharge Plan: [place]       Living arrangements for the past 2 months: [place]                                      Prior Living Arrangements/Services Living arrangements for the past 2 months: [place] Lives with:: Facility Resident Patient language and need for interpreter reviewed:: Yes        Need for Family Participation in Patient Care: Yes (Comment) Care giver support system in place?: Yes (comment)   Criminal Activity/Legal Involvement Pertinent to Current Situation/Hospitalization: No - Comment as needed  Activities of Daily Living   ADL Screening (condition at time of admission) Patient's cognitive ability adequate to safely complete daily activities?: No Is the patient deaf or have difficulty hearing?: No Does the patient have difficulty seeing, even when wearing glasses/contacts?: No Does the patient have difficulty concentrating, remembering, or making decisions?: Yes Patient able to express need for assistance with ADLs?: No Does the patient have difficulty dressing or bathing?: Yes Independently performs ADLs?: No Communication: Independent Dressing (OT): Needs assistance Is this  a change from baseline?: Pre-admission baseline Grooming: Needs assistance Is this a change from baseline?: Pre-admission baseline Feeding: Needs assistance Is this a change from baseline?: Pre-admission baseline Bathing: Needs assistance Is this a change from baseline?: Pre-admission baseline Toileting: Needs assistance Is this a change from baseline?: Pre-admission baseline In/Out Bed: Needs assistance Is this a change from baseline?: Pre-admission baseline Walks in Home: Needs assistance Is this a change from baseline?: Pre-admission baseline Does the patient have difficulty walking or climbing stairs?: Yes Weakness of Legs: Both Weakness of Arms/Hands: Both  Permission Sought/Granted                  Emotional Assessment Appearance:: Appears stated age Attitude/Demeanor/Rapport: Lethargic Affect (typically observed): Quiet, Calm, Withdrawn Orientation: : Oriented to Self Alcohol / Substance Use: Not Applicable Psych Involvement: No (comment)  Admission diagnosis:  CHF (congestive heart failure) (HCC) [I50.9] Pleural effusion [J90] Anticoagulated [Z79.01] Acute respiratory failure with hypoxia (HCC) [J96.01] Closed head injury, initial encounter [S09.90XA] Community acquired pneumonia, unspecified laterality [J18.9] Patient Active Problem List   Diagnosis Date Noted   Pressure injury of skin 09/10/2021   Alzheimer's dementia (HCC)    PAF (paroxysmal atrial fibrillation) (HCC)    Parkinson's disease (HCC)    Hypokalemia    Acute on chronic combined systolic and diastolic CHF (congestive heart failure) ([place]) 09/09/2021   Acute hypoxemic respiratory failure ([place]) 09/09/2021   Peripheral cyanosis 07/18/2021   Acute blood loss as cause of  postoperative anemia 06/17/2021   Protein-calorie malnutrition, severe 06/10/2021   Closed intertrochanteric fracture of left hip (HCC)    Weight loss 05/17/2018   Chronic anticoagulation 05/17/2018   Sigmoid volvulus ([place])  04/26/2018   Atrial flutter ([place]) 01/23/2018   Orthostatic hypotension 01/22/2018   Constipation 09/25/2016   Anxiety 04/24/2016   Periodic limb movement sleep disorder 06/22/2015   Vitamin deficiency 02/19/2015   Memory loss 02/19/2015   Vitamin D deficiency 02/19/2015   Other fatigue 02/19/2015   Mild depression 08/18/2014   Disturbed cognition 08/18/2014   Snoring 08/18/2014   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 [place], [place] - [address] [place] [address] Phone: [phone] Fax: [phone]     Social Determinants of Health (SDOH) Interventions    Readmission Risk Interventions     No data to display

## 2021-09-12 NOTE — [place] FL2 (Signed)
[place] MEDICAID FL2 LEVEL OF CARE SCREENING TOOL     IDENTIFICATION  Patient Name: Stephen Gentry Birthdate: 05-Mar-1935 Sex: male Admission Date (Current Location): 09/09/2021  [facility] and [place] Number:  Producer, television/film/video and Address:  [facility], [address] [place], [place] [address]      Provider Number: [id number]  Attending Physician Name and Address:  Noralee Stain, DO  Relative Name and Phone Number:  McCaffrey,[name] (Spouse)   [phone] (Mobile)    Current Level of Care: Hospital Recommended Level of Care: Skilled Nursing Facility Prior Approval Number:    Date Approved/Denied:   PASRR Number:    Discharge Plan: SNF    Current Diagnoses: Patient Active Problem List   Diagnosis Date Noted   Pressure injury of skin 09/10/2021   Alzheimer's dementia (HCC)    PAF (paroxysmal atrial fibrillation) (HCC)    Parkinson's disease (HCC)    Hypokalemia    Acute on chronic combined systolic and diastolic CHF (congestive heart failure) (HCC) 09/09/2021   Acute hypoxemic respiratory failure (HCC) 09/09/2021   Peripheral cyanosis 07/18/2021   Acute blood loss as cause of postoperative anemia 06/17/2021   Protein-calorie malnutrition, severe 06/10/2021   Closed intertrochanteric fracture of left hip (HCC)    Weight loss 05/17/2018   Chronic anticoagulation 05/17/2018   Sigmoid volvulus (HCC) 04/26/2018   Atrial flutter (HCC) 01/23/2018   Orthostatic hypotension 01/22/2018   Constipation 09/25/2016   Anxiety 04/24/2016   Periodic limb movement sleep disorder 06/22/2015   Vitamin deficiency 02/19/2015   Memory loss 02/19/2015   Vitamin D deficiency 02/19/2015   Other fatigue 02/19/2015   Mild depression 08/18/2014   Disturbed cognition 08/18/2014   Snoring 08/18/2014   Idiopathic Parkinson's disease (HCC) 02/07/2014   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    Orientation RESPIRATION BLADDER Height &  Weight     Self  Normal Incontinent Weight: 141 lb 12.1 oz (64.3 kg) Height:  6' (182.9 cm)  BEHAVIORAL SYMPTOMS/MOOD NEUROLOGICAL BOWEL NUTRITION STATUS      Incontinent Diet (see d/c summary)  AMBULATORY STATUS COMMUNICATION OF NEEDS Skin   Extensive Assist Verbally PU Stage and Appropriate Care (Pressure injury coccyx stage 2)                       Personal Care Assistance Level of Assistance  Bathing, Dressing, Feeding Bathing Assistance: Maximum assistance Feeding assistance: Independent Dressing Assistance: Maximum assistance     Functional Limitations Info  Sight, Hearing, Speech Sight Info: Impaired Hearing Info: Adequate Speech Info: Adequate    SPECIAL CARE FACTORS FREQUENCY  OT (By licensed OT), PT (By licensed PT)     PT Frequency: 5x/week OT Frequency: 5x/week            Contractures Contractures Info: Not present    Additional Factors Info  Code Status, Allergies Code Status Info: DNR Allergies Info: Tetracyclines & Related, lorazepam, Donepezil, Amitiza (lubiprostone)           Current Medications (09/12/2021):  This is the current hospital active medication list Current Facility-Administered Medications  Medication Dose Route Frequency Provider Last Rate Last Admin   0.9 %  sodium chloride infusion   Intravenous PRN Arrien, York Ram, MD 10 mL/hr at 09/12/21 0757 New Bag at 09/12/21 0757   acetaminophen (TYLENOL) tablet 650 mg  650 mg Oral Q6H PRN Emeline General, MD       apixaban Everlene Balls) tablet 5 mg  5 mg Oral BID Mikey College T, MD   5 mg at 09/12/21 0801   bisacodyl (DULCOLAX) suppository 10 mg  10 mg Rectal Daily PRN Emeline General, MD       Carbidopa-Levodopa ER (SINEMET CR) 25-100 MG tablet controlled release 1 tablet  1 tablet Oral QHS Mikey College T, MD   1 tablet at 09/11/21 2130   Carbidopa-Levodopa ER (SINEMET CR) 25-100 MG tablet controlled release 2 tablet  2 tablet Oral TID Emeline General, MD   2 tablet at 09/12/21 0955    empagliflozin (JARDIANCE) tablet 10 mg  10 mg Oral Daily Arrien, York Ram, MD   10 mg at 09/12/21 0801   feeding supplement (ENSURE ENLIVE / ENSURE PLUS) liquid 237 mL  237 mL Oral BID BM Mikey College T, MD   237 mL at 09/12/21 6384   fludrocortisone (FLORINEF) tablet 0.1 mg  0.1 mg Oral BID Mikey College T, MD   0.1 mg at 09/12/21 5364   food thickener (SIMPLYTHICK (HONEY/LEVEL 3/MODERATELY THICK)) 1 packet  1 packet Oral PRN Arrien, York Ram, MD       hydrALAZINE (APRESOLINE) injection 5 mg  5 mg Intravenous Q6H PRN Mikey College T, MD       mirtazapine (REMERON) tablet 7.5 mg  7.5 mg Oral QHS Mikey College T, MD   7.5 mg at 09/11/21 2130   PARoxetine (PAXIL) tablet 30 mg  30 mg Oral QPC supper Noralee Stain, DO       polyethylene glycol (MIRALAX / GLYCOLAX) packet 34 g  34 g Oral BID Mikey College T, MD       potassium chloride 10 mEq in 100 mL IVPB  10 mEq Intravenous Q1 Hr x 4 Noralee Stain, DO 100 mL/hr at 09/12/21 0955 10 mEq at 09/12/21 0955   rivastigmine (EXELON) 9.5 mg/24hr 9.5 mg  9.5 mg Transdermal Daily Noralee Stain, DO   9.5 mg at 09/12/21 6803   senna (SENOKOT) tablet 8.6 mg  1 tablet Oral Daily Mikey College T, MD   8.6 mg at 09/12/21 0801   sodium chloride flush (NS) 0.9 % injection 3 mL  3 mL Intravenous Q12H Mikey College T, MD   3 mL at 09/12/21 0801     Discharge Medications: Please see discharge summary for a list of discharge medications.  Relevant Imaging Results:  Relevant Lab Results:   Additional Information SSN: [id number], pt is vaccinated for covid but not boosted.  [name] Aris Lot, LCSW

## 2021-09-13 DIAGNOSIS — I5043 Acute on chronic combined systolic (congestive) and diastolic (congestive) heart failure: Secondary | ICD-10-CM | POA: Diagnosis not present

## 2021-09-13 LAB — BASIC METABOLIC PANEL
Anion gap: 12 (ref 5–15)
BUN: 15 mg/dL (ref 8–23)
CO2: 28 mmol/L (ref 22–32)
Calcium: 9.1 mg/dL (ref 8.9–10.3)
Chloride: 100 mmol/L (ref 98–111)
Creatinine, Ser: 0.7 mg/dL (ref 0.61–1.24)
GFR, Estimated: >60 mL/min (ref >60)
Glucose, Bld: 88 mg/dL (ref 70–99)
Potassium: 3.5 mmol/L (ref 3.5–5.1)
Sodium: 140 mmol/L (ref 135–145)

## 2021-09-13 LAB — MAGNESIUM: Magnesium: 2.2 mg/dL (ref 1.7–2.4)

## 2021-09-13 MED ORDER — POTASSIUM CHLORIDE ER 10 MEQ PO TBCR: 20.0000 meq | EXTENDED_RELEASE_TABLET | Freq: Every day | ORAL | 0 refills | Status: AC

## 2021-09-13 MED ORDER — FLUDROCORTISONE ACETATE 0.1 MG PO TABS: 0.1000 mg | ORAL_TABLET | Freq: Two times a day (BID) | ORAL | 0 refills | Status: AC

## 2021-09-13 MED ORDER — FUROSEMIDE 40 MG PO TABS: 40.0000 mg | ORAL_TABLET | Freq: Every day | ORAL | 0 refills | Status: AC

## 2021-09-13 MED ORDER — EMPAGLIFLOZIN 10 MG PO TABS: 10.0000 mg | ORAL_TABLET | Freq: Every day | ORAL | 0 refills | Status: AC

## 2021-09-13 MED ORDER — POTASSIUM CHLORIDE 20 MEQ PO PACK
40.0000 meq | PACK | Freq: Once | ORAL | Status: AC
  Administered 2021-09-13: 40 meq via ORAL
  Filled 2021-09-13: qty 2

## 2021-09-13 NOTE — Discharge Summary (Signed)
Physician Discharge Summary  Stephen Gentry EAV:[id number] DOB: 31-Dec-1934 DOA: 09/09/2021  PCP: Marinda Elk, MD  Admit date: 09/09/2021 Discharge date: 09/13/2021  Admitted From: SNF Disposition:  SNF   Recommendations for Outpatient Follow-up:  Follow up with PCP in 1 week  Discharge Condition: Stable, improved CODE STATUS: DNR Diet recommendation:  Diet Orders (From admission, onward)     Start     Ordered   09/11/21 1216  DIET DYS 3 Room service appropriate? Yes; Fluid consistency: Honey Thick; Fluid restriction: 2000 mL Fluid  Diet effective now       Question Answer Comment  Room service appropriate? Yes   Fluid consistency: Honey Thick   Fluid restriction: 2000 mL Fluid      09/11/21 1215             Brief/Interim Summary: Stephen Gentry is  87 yo male who was admitted to the hospital with the working diagnosis of acute on chronic hypoxemic respiratory failure. His has past medical history of Parkinson's disease, orthostatic hypotension, diastolic heart failure, paroxysmal atrial fibrillation, dysphagia and ambulatory dysfunction who presented after a mechanical fall at SNF. He reported feeling dizzy and lightheaded before the fall, with no loss of consciousness. Head and neck CT with no acute changes.  Chest radiograph with mild cardiomegaly with bilateral hilar vascular congestion and bilateral pleural effusions. CT chest with bilateral pleural effusions with associated compression atelectasis.  He was admitted for CHF exacerbation, respiratory failure.  Patient was diuresed with IV Lasix.  He diuresed well, became euvolemic.  Potassium was replaced.  Discharge Diagnoses:   Principal Problem:   Acute on chronic combined systolic and diastolic CHF (congestive heart failure) (HCC) Active Problems:   Acute hypoxemic respiratory failure (HCC)   Pressure injury of skin   Alzheimer's dementia (HCC)   PAF (paroxysmal atrial fibrillation) (HCC)   Parkinson's disease  (HCC)   Hypokalemia   Acute on chronic combined systolic and diastolic heart failure -Echo showed mild reduction in LV systolic function with EF 45 to 50%, grade 1 diastolic dysfunction, apical segments of LV are akinetic, hypokinesis of the mid antero septal and mid anterior segments. RV systolic function is preserved. Small pericardial effusion.  -Continue Jardiance, oral Lasix -Strict I's and O's, daily weight, fluid restriction diet  Acute hypoxemic respiratory failure -Secondary to above.  Now weaned to room air   Parkinson's disease -Carbidopa, levodopa  Alzheimer's dementia -Resided at memory care unit prior to recent left hip fracture, was discharged to SNF post surgery  Depression -Paroxetine, mirtazapine  Paroxysmal A-fib -Eliquis  Hypokalemia -Replace   Vit B 12 deficiency -B12 monthly supplement was due this week, Replaced 7/5     In agreement with assessment of the pressure ulcer as below:  Pressure Injury 09/10/21 Coccyx Mid Stage 2 -  Partial thickness loss of dermis presenting as a shallow open injury with a red, pink wound bed without slough. (Active)  09/10/21   Location: Coccyx  Location Orientation: Mid  Staging: Stage 2 -  Partial thickness loss of dermis presenting as a shallow open injury with a red, pink wound bed without slough.  Wound Description (Comments):   Present on Admission: Yes  Dressing Type Foam - Lift dressing to assess site every shift 09/13/21 0300       Discharge Instructions  Discharge Instructions     Discharge wound care:   Complete by: As directed    Wound care to partial thickness pressure injury, Stage 2 to  sacrum:  Cleanse with NS, pat dry. Cover with size appropriate piece of xeroform gauze, top with silicone bordered foam for sacrum. Turn and reposition from side to side and minimize time in the supine position.   Increase activity slowly   Complete by: As directed       Allergies as of 09/13/2021        Reactions   Lorazepam Other (See Comments)   "Combative, foul language"- Wife doesn't want patient to have again "Allergic," per [facility]   Tetracyclines & Related Anaphylaxis   Donepezil Other (See Comments)   "Allergic," per [facility]   Amitiza [lubiprostone] Nausea Only, Other (See Comments)   "Allergic," per [facility]        Medication List     TAKE these medications    acetaminophen 325 MG tablet Commonly known as: TYLENOL Take 2 tablets (650 mg total) by mouth every 6 (six) hours as needed for mild pain, fever or headache. What changed: reasons to take this   apixaban 5 MG Tabs tablet Commonly known as: Eliquis Take 1 tablet (5 mg total) by mouth 2 (two) times daily.   bisacodyl 10 MG suppository Commonly known as: DULCOLAX Place 10 mg rectally daily as needed (for constipation not relieved by Milk of Magnesia). f not relieved by MOM, give 10 mg Bisacodyl suppositiory rectally X 1 dose in 24 hours as needed   Carbidopa-Levodopa ER 25-100 MG tablet controlled release Commonly known as: SINEMET CR Take 1-2 tablets by mouth See admin instructions. Take 2 tablets by mouth at 6:30 AM, 11:30 AM, and 4:30 PM. Take 1 tablet by mouth at 8:00 PM every night.   cyanocobalamin 1000 MCG/ML injection Commonly known as: (VITAMIN B-12) Inject 1,000 mcg into the muscle every 30 (thirty) days.   empagliflozin 10 MG Tabs tablet Commonly known as: JARDIANCE Take 1 tablet (10 mg total) by mouth daily. Start taking on: September 14, 2021   Ensure Take 1 Can by mouth See admin instructions. DRINK 1 CAN BY MOUTH TWICE A DAY (OR 120 ML MEDPASS) FOR SUPPLEMENT   FeroSul 325 (65 FE) MG tablet Generic drug: ferrous sulfate Take 325 mg by mouth 2 (two) times daily.   fludrocortisone 0.1 MG tablet Commonly known as: FLORINEF Take 1 tablet (0.1 mg total) by mouth 2 (two) times daily. What changed: how much to take   furosemide 40 MG tablet Commonly known as: Lasix Take 1 tablet (40 mg total) by mouth  daily.   magnesium hydroxide 400 MG/5ML suspension Commonly known as: MILK OF MAGNESIA Take 30 mLs by mouth daily as needed for mild constipation.   midodrine 10 MG tablet Commonly known as: PROAMATINE Take 1 tablet (10 mg) at 7 AM, 11 AM, and 3 PM What changed:  how much to take how to take this when to take this additional instructions   mirtazapine 7.5 MG tablet Commonly known as: REMERON Take 7.5 mg by mouth at bedtime.   OXYGEN Inhale 2-4 L/min into the lungs as needed (to maintain sats at PREFERABLY GREATER THAN 90%).   PARoxetine 30 MG tablet Commonly known as: PAXIL Take 1 tablet (30 mg total) by mouth daily.   polyethylene glycol 17 g packet Commonly known as: MIRALAX / GLYCOLAX Take 34 g by mouth 2 (two) times daily.   potassium chloride 10 MEQ tablet Commonly known as: KLOR-CON Take 2 tablets (20 mEq total) by mouth daily. With lasix What changed:  how much to take additional instructions   RA Saline Enema  19-7 GM/118ML Enem Place 1 enema rectally daily as needed (for constipation not relieved by Dulcolax suppository and CALL MD).   rivastigmine 9.5 mg/24hr Commonly known as: EXELON Place 9.5 mg onto the skin daily.   senna 8.6 MG Tabs tablet Commonly known as: SENOKOT Take 1 tablet by mouth in the morning and at bedtime.   SUPER B COMPLEX PO Take 1 tablet by mouth daily.   Vitamin D3 50 MCG (2000 UT) Tabs Take 2,000 Units by mouth in the morning.               Discharge Care Instructions  (From admission, onward)           Start     Ordered   09/13/21 0000  Discharge wound care:       Comments: Wound care to partial thickness pressure injury, Stage 2 to sacrum:  Cleanse with NS, pat dry. Cover with size appropriate piece of xeroform gauze, top with silicone bordered foam for sacrum. Turn and reposition from side to side and minimize time in the supine position.   09/13/21 1033            Allergies  Allergen Reactions    Lorazepam Other (See Comments)    "Combative, foul language"- Wife doesn't want patient to have again "Allergic," per [facility]   Tetracyclines & Related Anaphylaxis   Donepezil Other (See Comments)    "Allergic," per [facility]   Amitiza [Lubiprostone] Nausea Only and Other (See Comments)    "Allergic," per [facility]       Procedures/Studies: ECHOCARDIOGRAM COMPLETE  Result Date: 09/10/2021    ECHOCARDIOGRAM REPORT   Patient Name:   JERRICO COVELLO Date of Exam: 09/10/2021 Medical Rec #:  [id number]    Height:       72.0 in Accession #:    [id number]   Weight:       144.6 lb Date of Birth:  Jul 17, 1934     BSA:          1.856 m Patient Age:    87 years     BP:           142/93 mmHg Patient Gender: M            HR:           73 bpm. Exam Location:  Inpatient Procedure: 2D Echo, Cardiac Doppler, Color Doppler and Intracardiac            Opacification Agent Indications:    CHF  History:        Patient has prior history of Echocardiogram examinations, most                 recent 01/23/2018.  Sonographer:    Roosvelt Maser RDCS Referring Phys: Emeline General  Sonographer Comments: No subcostal window. IMPRESSIONS  1. Left ventricular ejection fraction, by estimation, is 45 to 50%. The left ventricle has mildly decreased function. The left ventricle demonstrates regional wall motion abnormalities (see scoring diagram/findings for description). All apical segments and the LV apex are akinetic. There is hypokinesis of the mid anteroseptal and mid anterior LV segments. Left ventricular diastolic parameters are consistent with Grade I diastolic dysfunction (impaired relaxation). No LV thrombus visualized.  2. Right ventricular systolic function is normal. The right ventricular size is normal.  3. A small pericardial effusion is present. The pericardial effusion is posterior and lateral to the left ventricle.  4. The mitral valve is grossly normal. Mild mitral valve regurgitation.  5. The aortic valve is tricuspid. There is mild calcification  of the aortic valve. There is mild thickening of the aortic valve. Aortic valve regurgitation is mild. Aortic valve sclerosis/calcification is present, without any evidence of aortic stenosis. Comparison(s): Compared to prior TTE in 2019, the LVEF has dropped to 45-50% with wall motion abnormalities as detailed above. FINDINGS  Left Ventricle: Left ventricular ejection fraction, by estimation, is 45 to 50%. The left ventricle has mildly decreased function. The left ventricle demonstrates regional wall motion abnormalities. There is akinesis off all apical segments and apex. The mid-anteroseptal and mid anterior LV segments are hypokinetic. Definity contrast agent was given IV to delineate the left ventricular endocardial borders. The left ventricular internal cavity size was normal in size. There is no left ventricular hypertrophy. Left ventricular diastolic parameters are consistent with Grade I diastolic dysfunction (impaired relaxation). Right Ventricle: The right ventricular size is normal. No increase in right ventricular wall thickness. Right ventricular systolic function is normal. Left Atrium: Left atrial size was not well visualized. Right Atrium: Right atrial size was not well visualized. Pericardium: A small pericardial effusion is present. The pericardial effusion is posterior and lateral to the left ventricle. Mitral Valve: The mitral valve is grossly normal. There is mild thickening of the mitral valve leaflet(s). There is mild calcification of the mitral valve leaflet(s). Mild to moderate mitral annular calcification. Mild mitral valve regurgitation. Tricuspid Valve: The tricuspid valve is normal in structure. Tricuspid valve regurgitation is trivial. Aortic Valve: The aortic valve is tricuspid. There is mild calcification of the aortic valve. There is mild thickening of the aortic valve. Aortic valve regurgitation is mild. Aortic valve sclerosis/calcification is present, without any evidence of  aortic stenosis. Aortic valve mean gradient measures 2.0 mmHg. Aortic valve peak gradient measures 3.3 mmHg. Aortic valve area, by VTI measures 2.68 cm. Pulmonic Valve: The pulmonic valve was normal in structure. Pulmonic valve regurgitation is trivial. Aorta: The aortic root is normal in size and structure. IAS/Shunts: The atrial septum is grossly normal.  LEFT VENTRICLE PLAX 2D LVIDd:         5.20 cm      Diastology LVIDs:         3.60 cm      LV e' medial:    5.55 cm/s LV PW:         1.00 cm      LV E/e' medial:  8.6 LV IVS:        0.90 cm      LV e' lateral:   5.87 cm/s LVOT diam:     2.00 cm      LV E/e' lateral: 8.2 LV SV:         49 LV SV Index:   27 LVOT Area:     3.14 cm  LV Volumes (MOD) LV vol d, MOD A2C: 217.5 ml LV vol d, MOD A4C: 142.0 ml LV vol s, MOD A2C: 94.5 ml LV vol s, MOD A4C: 73.1 ml LV SV MOD A2C:     123.0 ml LV SV MOD A4C:     142.0 ml LV SV MOD BP:      110.5 ml RIGHT VENTRICLE RV Basal diam:  3.70 cm RV S prime:     14.00 cm/s TAPSE (M-mode): 2.0 cm LEFT ATRIUM             Index        RIGHT ATRIUM           Index  LA diam:        4.20 cm 2.26 cm/m   RA Area:     21.20 cm LA Vol (A2C):   48.6 ml 26.18 ml/m  RA Volume:   62.10 ml  33.45 ml/m LA Vol (A4C):   55.5 ml 29.90 ml/m LA Biplane Vol: 54.7 ml 29.47 ml/m  AORTIC VALVE AV Area (Vmax):    2.65 cm AV Area (Vmean):   2.43 cm AV Area (VTI):     2.68 cm AV Vmax:           90.50 cm/s AV Vmean:          60.300 cm/s AV VTI:            0.184 m AV Peak Grad:      3.3 mmHg AV Mean Grad:      2.0 mmHg LVOT Vmax:         76.40 cm/s LVOT Vmean:        46.700 cm/s LVOT VTI:          0.157 m LVOT/AV VTI ratio: 0.85  AORTA Ao Root diam: 3.70 cm Ao Arch diam: 3.3 cm MITRAL VALVE MV Area (PHT): 2.83 cm    SHUNTS MV Decel Time: 268 msec    Systemic VTI:  0.16 m MV E velocity: 48.00 cm/s  Systemic Diam: 2.00 cm MV A velocity: 65.00 cm/s MV E/A ratio:  0.74 Laurance FlattenHeather Pemberton MD Electronically signed by Laurance FlattenHeather Pemberton MD Signature Date/Time:  09/10/2021/11:41:35 AM    Final    CT CHEST WO CONTRAST  Result Date: 09/09/2021 CLINICAL DATA:  Fall EXAM: CT CHEST WITHOUT CONTRAST TECHNIQUE: Multidetector CT imaging of the chest was performed following the standard protocol without IV contrast. RADIATION DOSE REDUCTION: This exam was performed according to the departmental dose-optimization program which includes automated exposure control, adjustment of the mA and/or kV according to patient size and/or use of iterative reconstruction technique. COMPARISON:  Chest x-ray 09/09/2021 FINDINGS: Cardiovascular: Limited evaluation without intravenous contrast. Mild aortic atherosclerosis. No aneurysm. Coronary vascular calcification. Mild cardiomegaly. Small pericardial effusion Mediastinum/Nodes: Midline trachea. No thyroid mass. No suspicious lymph nodes. Esophagus within normal limits. Lungs/Pleura: Moderate bilateral pleural effusions. Probable passive atelectasis in the lower lobes. Mild bronchiectasis in the right middle lobe and bilateral lower lobes. Upper Abdomen: Considerable air distension of bowel loops in the upper abdomen. Cysts in the right hepatic lobe. Musculoskeletal: No acute osseous abnormality. Degenerative changes of the spine. IMPRESSION: 1. Cardiomegaly with moderate bilateral pleural effusions and probable passive atelectasis in lower lobes 2. Mild bronchiectasis in the right middle bilateral lower lobes. Aortic Atherosclerosis (ICD10-I70.0). Electronically Signed   By: Jasmine PangKim  Fujinaga M.D.   On: 09/09/2021 20:37   DG Chest 2 View  Result Date: 09/09/2021 CLINICAL DATA:  Larey SeatFell.  Anticoagulated. EXAM: CHEST - 2 VIEW COMPARISON:  Earlier same day FINDINGS: Cardiomegaly. Tortuous aorta. Bilateral pleural effusions layering dependently with dependent atelectasis. The upper lungs are clear. No acute bone finding. IMPRESSION: Bilateral pleural effusions layering dependently. Associated dependent atelectasis. Upper lungs clear. Cardiomegaly and  tortuous aorta. Electronically Signed   By: Paulina FusiMark  Shogry M.D.   On: 09/09/2021 14:33   DG Pelvis 1-2 Views  Result Date: 09/09/2021 CLINICAL DATA:  Anticoagulated.  Fell. EXAM: PELVIS - 1-2 VIEW COMPARISON:  06/07/2021 FINDINGS: No evidence of acute pelvic fracture. Right hip appears negative. Previous ORIF of left intertrochanteric fracture with gamma nail device. Position and alignment appear the same. No visible complication. Distal end of the gamma  nail is not included. Moderate amount of intestinal gas noted within the colon. IMPRESSION: No acute bone finding. Previous ORIF of intertrochanteric fracture of the left femur. Electronically Signed   By: Paulina Fusi M.D.   On: 09/09/2021 14:31   CT Head Wo Contrast  Result Date: 09/09/2021 CLINICAL DATA:  Trauma EXAM: CT HEAD WITHOUT CONTRAST CT CERVICAL SPINE WITHOUT CONTRAST TECHNIQUE: Multidetector CT imaging of the head and cervical spine was performed following the standard protocol without intravenous contrast. Multiplanar CT image reconstructions of the cervical spine were also generated. RADIATION DOSE REDUCTION: This exam was performed according to the departmental dose-optimization program which includes automated exposure control, adjustment of the mA and/or kV according to patient size and/or use of iterative reconstruction technique. COMPARISON:  CT head and cervical spine dated June 06, 2021 FINDINGS: CT HEAD FINDINGS Brain: Chronic white matter ischemic change. No evidence of acute infarction, hemorrhage, hydrocephalus, extra-axial collection or mass lesion/mass effect. Vascular: No hyperdense vessel or unexpected calcification. Skull: Normal. Negative for fracture or focal lesion. Sinuses/Orbits: No acute finding. Other: None. CT CERVICAL SPINE FINDINGS Alignment: Normal. Skull base and vertebrae: No acute fracture. No primary bone lesion or focal pathologic process. Soft tissues and spinal canal: No prevertebral fluid or swelling. No visible  canal hematoma. Disc levels: Mild degenerative degenerative disc disease, most pronounced at C5-C6 and C6-C7. Upper chest: Negative. Other: None. IMPRESSION: 1. No acute intracranial abnormality. 2. No evidence of traumatic malalignment or acute cervical spine fracture. Electronically Signed   By: Allegra Lai M.D.   On: 09/09/2021 13:47   CT Cervical Spine Wo Contrast  Result Date: 09/09/2021 CLINICAL DATA:  Trauma EXAM: CT HEAD WITHOUT CONTRAST CT CERVICAL SPINE WITHOUT CONTRAST TECHNIQUE: Multidetector CT imaging of the head and cervical spine was performed following the standard protocol without intravenous contrast. Multiplanar CT image reconstructions of the cervical spine were also generated. RADIATION DOSE REDUCTION: This exam was performed according to the departmental dose-optimization program which includes automated exposure control, adjustment of the mA and/or kV according to patient size and/or use of iterative reconstruction technique. COMPARISON:  CT head and cervical spine dated June 06, 2021 FINDINGS: CT HEAD FINDINGS Brain: Chronic white matter ischemic change. No evidence of acute infarction, hemorrhage, hydrocephalus, extra-axial collection or mass lesion/mass effect. Vascular: No hyperdense vessel or unexpected calcification. Skull: Normal. Negative for fracture or focal lesion. Sinuses/Orbits: No acute finding. Other: None. CT CERVICAL SPINE FINDINGS Alignment: Normal. Skull base and vertebrae: No acute fracture. No primary bone lesion or focal pathologic process. Soft tissues and spinal canal: No prevertebral fluid or swelling. No visible canal hematoma. Disc levels: Mild degenerative degenerative disc disease, most pronounced at C5-C6 and C6-C7. Upper chest: Negative. Other: None. IMPRESSION: 1. No acute intracranial abnormality. 2. No evidence of traumatic malalignment or acute cervical spine fracture. Electronically Signed   By: Allegra Lai M.D.   On: 09/09/2021 13:47   DG  Chest Port 1 View  Result Date: 09/09/2021 CLINICAL DATA:  Fall last night. EXAM: PORTABLE CHEST 1 VIEW COMPARISON:  09/20/2018 from [facility]. FINDINGS: AP portable radiograph with apical lordotic positioning. Patient rotated right. Mild cardiomegaly. No pleural effusion or pneumothorax. Increased density over the mid and lower lungs is relatively scattered increased density over the mid and lower lungs is symmetric with sparing of the costophrenic angles. The upper lungs are clear. IMPRESSION: Symmetric increased density projecting over the mid and lower chest bilaterally possibly artifactual given appearance. New since prior exam. Consider further evaluation with  PA and lateral radiographs to exclude airspace disease/pneumonia. No posttraumatic deformity identified. Electronically Signed   By: Jeronimo Greaves M.D.   On: 09/09/2021 13:27       Discharge Exam: Vitals:   09/12/21 2005 09/13/21 0354  BP: 112/64 126/81  Pulse: 89 64  Resp: 14 13  Temp: 97.7 F (36.5 C) (!) 97.5 F (36.4 C)  SpO2: 99% 100%    General: Pt is alert, awake, not in acute distress Cardiovascular: RRR, S1/S2 +, no edema Respiratory: CTA bilaterally, no wheezing, no rhonchi, no respiratory distress, no conversational dyspnea, on room air Abdominal: Soft, NT, ND, bowel sounds + Extremities: no edema, no cyanosis Psych: Normal mood and affect, dementia   The results of significant diagnostics from this hospitalization (including imaging, microbiology, ancillary and laboratory) are listed below for reference.     Microbiology: Recent Results (from the past 240 hour(s))  Resp Panel by RT-PCR (Flu A&B, Covid) Anterior Nasal Swab     Status: None   Collection Time: 09/09/21  1:06 PM   Specimen: Anterior Nasal Swab  Result Value Ref Range Status   SARS Coronavirus 2 by RT PCR NEGATIVE NEGATIVE Final    Comment: (NOTE) SARS-CoV-2 target nucleic acids are NOT DETECTED.  The SARS-CoV-2 RNA is generally  detectable in upper respiratory specimens during the acute phase of infection. The lowest concentration of SARS-CoV-2 viral copies this assay can detect is 138 copies/mL. A negative result does not preclude SARS-Cov-2 infection and should not be used as the sole basis for treatment or other patient management decisions. A negative result may occur with  improper specimen collection/handling, submission of specimen other than nasopharyngeal swab, presence of viral mutation(s) within the areas targeted by this assay, and inadequate number of viral copies(<138 copies/mL). A negative result must be combined with clinical observations, patient history, and epidemiological information. The expected result is Negative.  Fact Sheet for Patients:  BloggerCourse.com  Fact Sheet for Healthcare Providers:  SeriousBroker.it  This test is no t yet approved or cleared by the [place] FDA and  has been authorized for detection and/or diagnosis of SARS-CoV-2 by FDA under an Emergency Use Authorization (EUA). This EUA will remain  in effect (meaning this test can be used) for the duration of the COVID-19 declaration under Section 564(b)(1) of the Act, 21 U.S.C.section 360bbb-3(b)(1), unless the authorization is terminated  or revoked sooner.       Influenza A by PCR NEGATIVE NEGATIVE Final   Influenza B by PCR NEGATIVE NEGATIVE Final    Comment: (NOTE) The Xpert Xpress SARS-CoV-2/FLU/RSV plus assay is intended as an aid in the diagnosis of influenza from Nasopharyngeal swab specimens and should not be used as a sole basis for treatment. Nasal washings and aspirates are unacceptable for Xpert Xpress SARS-CoV-2/FLU/RSV testing.  Fact Sheet for Patients: BloggerCourse.com  Fact Sheet for Healthcare Providers: SeriousBroker.it  This test is not yet approved or cleared by the [place] FDA  and has been authorized for detection and/or diagnosis of SARS-CoV-2 by FDA under an Emergency Use Authorization (EUA). This EUA will remain in effect (meaning this test can be used) for the duration of the COVID-19 declaration under Section 564(b)(1) of the Act, 21 U.S.C. section 360bbb-3(b)(1), unless the authorization is terminated or revoked.  Performed at [facility], [address], [place], [place] [address]      Labs: BNP (last 3 results) Recent Labs    09/09/21 1310  BNP 736.1*   Basic Metabolic Panel:  Recent Labs  Lab 09/09/21 1310 09/09/21 1320 09/10/21 0228 09/11/21 0204 09/12/21 0329 09/13/21 0223  NA 142 141 145 144 145 140  K 3.3* 3.2* 2.9* 2.6* 2.6* 3.5  CL 100  --  100 98 97* 100  CO2 33*  --  31 34* 36* 28  GLUCOSE 108*  --  91 83 95 88  BUN 18  --  17 16 15 17   CREATININE 0.74  --  0.67 0.76 0.76 0.70  CALCIUM 9.0  --  9.3 8.9 9.2 9.1  MG  --   --   --  2.1  --  2.2   Liver Function Tests: No results for input(s): "AST", "ALT", "ALKPHOS", "BILITOT", "PROT", "ALBUMIN" in the last 168 hours. No results for input(s): "LIPASE", "AMYLASE" in the last 168 hours. No results for input(s): "AMMONIA" in the last 168 hours. CBC: Recent Labs  Lab 09/09/21 1310 09/09/21 1320  WBC 3.6*  --   NEUTROABS 1.9  --   HGB 12.1* 12.2*  HCT 39.4 36.0*  MCV 88.5  --   PLT 172  --    Cardiac Enzymes: No results for input(s): "CKTOTAL", "CKMB", "CKMBINDEX", "TROPONINI" in the last 168 hours. BNP: Invalid input(s): "POCBNP" CBG: No results for input(s): "GLUCAP" in the last 168 hours. D-Dimer No results for input(s): "DDIMER" in the last 72 hours. Hgb A1c No results for input(s): "HGBA1C" in the last 72 hours. Lipid Profile No results for input(s): "CHOL", "HDL", "LDLCALC", "TRIG", "CHOLHDL", "LDLDIRECT" in the last 72 hours. Thyroid function studies No results for input(s): "TSH", "T4TOTAL", "T3FREE", "THYROIDAB" in the last 72 hours.  Invalid  input(s): "FREET3" Anemia work up No results for input(s): "VITAMINB12", "FOLATE", "FERRITIN", "TIBC", "IRON", "RETICCTPCT" in the last 72 hours. Urinalysis    Component Value Date/Time   COLORURINE AMBER (A) 01/14/2020 1114   APPEARANCEUR CLOUDY (A) 01/14/2020 1114   LABSPEC 1.027 01/14/2020 1114   PHURINE 5.0 01/14/2020 1114   GLUCOSEU NEGATIVE 01/14/2020 1114   HGBUR NEGATIVE 01/14/2020 1114   BILIRUBINUR NEGATIVE 01/14/2020 1114   KETONESUR 5 (A) 01/14/2020 1114   PROTEINUR 30 (A) 01/14/2020 1114   NITRITE NEGATIVE 01/14/2020 1114   LEUKOCYTESUR NEGATIVE 01/14/2020 1114   Sepsis Labs Recent Labs  Lab 09/09/21 1310  WBC 3.6*   Microbiology Recent Results (from the past 240 hour(s))  Resp Panel by RT-PCR (Flu A&B, Covid) Anterior Nasal Swab     Status: None   Collection Time: 09/09/21  1:06 PM   Specimen: Anterior Nasal Swab  Result Value Ref Range Status   SARS Coronavirus 2 by RT PCR NEGATIVE NEGATIVE Final    Comment: (NOTE) SARS-CoV-2 target nucleic acids are NOT DETECTED.  The SARS-CoV-2 RNA is generally detectable in upper respiratory specimens during the acute phase of infection. The lowest concentration of SARS-CoV-2 viral copies this assay can detect is 138 copies/mL. A negative result does not preclude SARS-Cov-2 infection and should not be used as the sole basis for treatment or other patient management decisions. A negative result may occur with  improper specimen collection/handling, submission of specimen other than nasopharyngeal swab, presence of viral mutation(s) within the areas targeted by this assay, and inadequate number of viral copies(<138 copies/mL). A negative result must be combined with clinical observations, patient history, and epidemiological information. The expected result is Negative.  Fact Sheet for Patients:  11/10/21  Fact Sheet for Healthcare Providers:   BloggerCourse.com  This test is no t yet approved or cleared by the [employer]  FDA and  has been authorized for detection and/or diagnosis of SARS-CoV-2 by FDA under an Emergency Use Authorization (EUA). This EUA will remain  in effect (meaning this test can be used) for the duration of the COVID-19 declaration under Section 564(b)(1) of the Act, 21 U.S.C.section 360bbb-3(b)(1), unless the authorization is terminated  or revoked sooner.       Influenza A by PCR NEGATIVE NEGATIVE Final   Influenza B by PCR NEGATIVE NEGATIVE Final    Comment: (NOTE) The Xpert Xpress SARS-CoV-2/FLU/RSV plus assay is intended as an aid in the diagnosis of influenza from Nasopharyngeal swab specimens and should not be used as a sole basis for treatment. Nasal washings and aspirates are unacceptable for Xpert Xpress SARS-CoV-2/FLU/RSV testing.  Fact Sheet for Patients: BloggerCourse.com  Fact Sheet for Healthcare Providers: [employer]ker[employer]  This test is not yet approved or cleared by the [place] FDA and has been authorized for detection and/or diagnosis of SARS-CoV-2 by FDA under an Emergency Use Authorization (EUA). This EUA will remain in effect (meaning this test can be used) for the duration of the COVID-19 declaration under Section 564(b)(1) of the Act, 21 U.S.C. section 360bbb-3(b)(1), unless the authorization is terminated or revoked.  Performed at [facility], [address], [place], [place] [address]      Patient was seen and examined on the day of discharge and was found to be in stable condition. Time coordinating discharge: 35 minutes including assessment and coordination of care, as well as examination of the patient.   SIGNED:  Noralee Stain, DO [facility] 09/13/2021, 10:33 AM

## 2021-09-13 NOTE — TOC Transition Note (Signed)
Transition of Care [facility]) - CM/SW Discharge Note   Patient Details  Name: Stephen Gentry MRN: [id number] Date of Birth: March 13, 1934  Transition of [facility]) CM/SW Contact:  Erin Sons, LCSW Phone Number: 09/13/2021, 12:01 PM   Clinical Narrative:     Patient will DC to: [facility] Anticipated DC date: 09/13/21 Family notified: Spouse Stephen Gentry Transport by: Sharin Mons   Per MD patient ready for DC to [facility]. RN, patient, patient's family, and facility notified of DC. Discharge Summary and FL2 sent to facility. RN to call report prior to discharge [phone]). DC packet on chart. Ambulance transport requested for patient.   CSW will sign off for now as social work intervention is no longer needed. Please consult [place] again if new needs arise.   Final next level of care: Skilled Nursing Facility Barriers to Discharge: No Barriers Identified   Patient Goals and CMS Choice        Discharge Placement              Patient chooses bed at: [facility] Patient to be transferred to facility by: [facility] Name of family member notified: Spouse Stephen Gentry Patient and family notified of of transfer: 09/13/21  Discharge Plan and Services                                     Social Determinants of Health (SDOH) Interventions     Readmission Risk Interventions     No data to display

## 2021-09-13 NOTE — Care Management Important Message (Signed)
Important Message  Patient Details  Name: Stephen Gentry MRN: [id number] Date of Birth: [birth year]   Medicare Important Message Given:  Yes     Renie Ora 09/13/2021, 9:38 AM

## 2021-12-08 DEATH — deceased

## 2022-09-08 IMAGING — CT CT ABD-PELV W/ CM
2 of 5 series · 15 of 46 positions shown, 17 images · IV contrast (Omni 300)
Comparison: CT the abdomen and pelvis 09/20/2018.

CLINICAL DATA: 85-year-old male with history of lower abdominal
pain for the past 4-5 weeks.

EXAM:
CT ABDOMEN AND PELVIS WITH CONTRAST
TECHNIQUE: Multidetector CT imaging of the abdomen and pelvis was performed
using the standard protocol following bolus administration of
intravenous contrast.
CONTRAST:  100mL OMNIPAQUE IOHEXOL 300 MG/ML  SOLN

[Series 3: a/p w/ 5mm · axial · 0.79mm/px · z∈[-417,-12]mm · 12 of 91 slices shown, 14 images]
[im 5/91  soft-tissue]
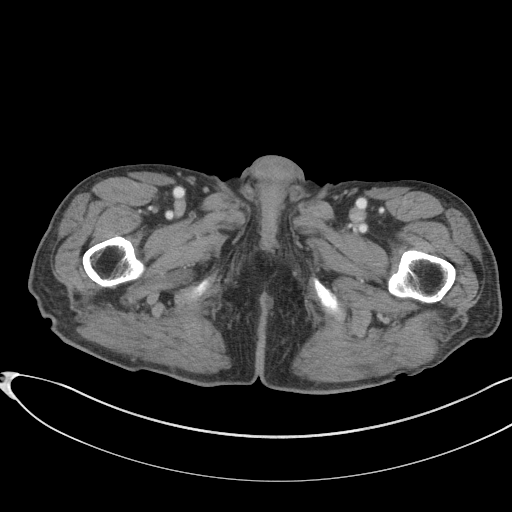
[im 5/91  bone]
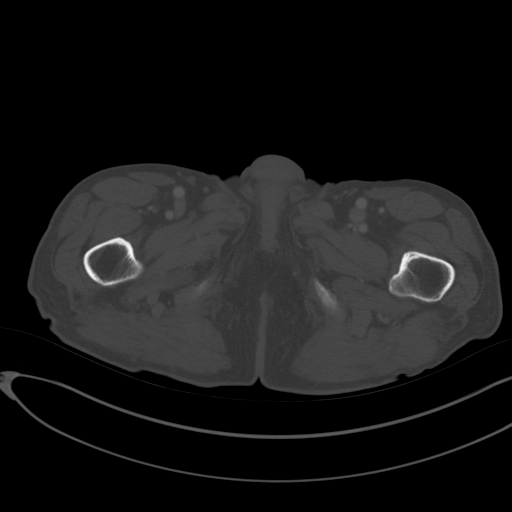
[im 15/91  soft-tissue]
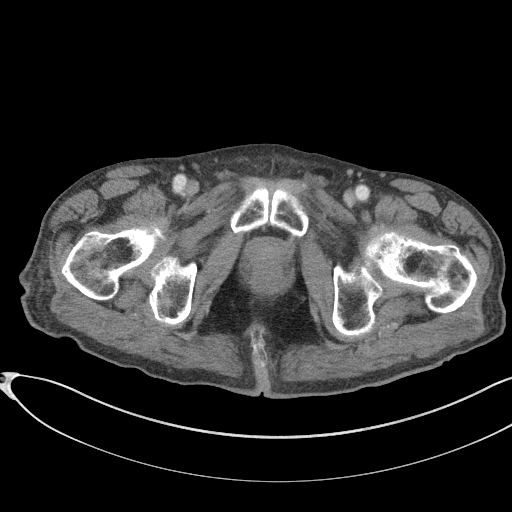
[im 19/91  soft-tissue]
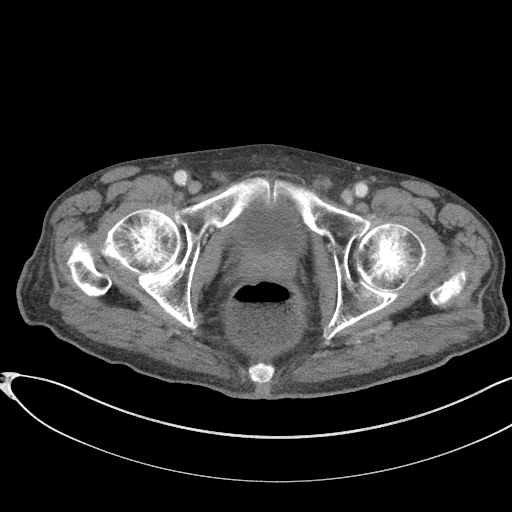
[im 29/91  soft-tissue]
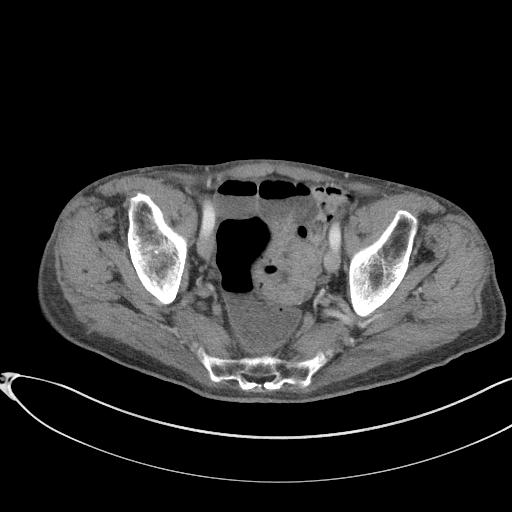
[im 34/91  soft-tissue]
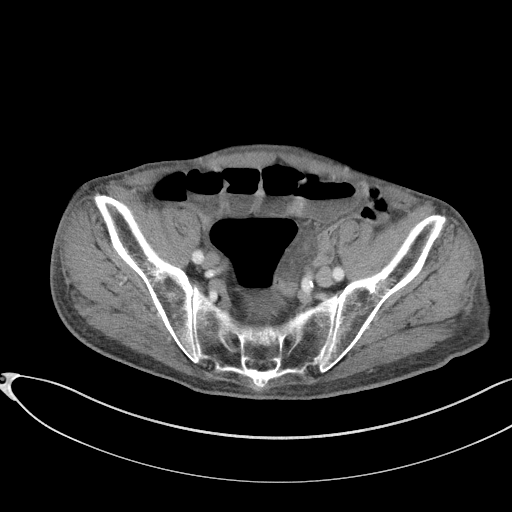
[im 43/91  soft-tissue]
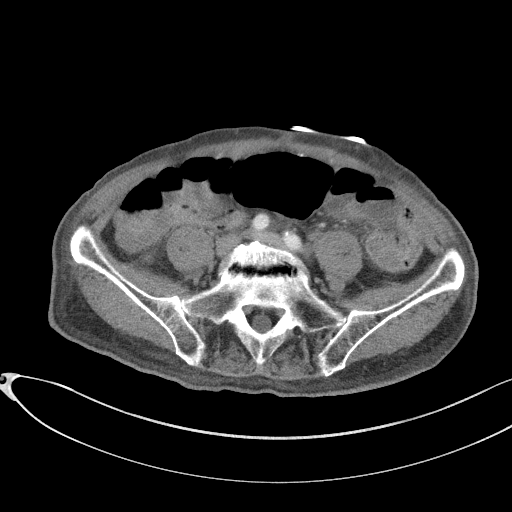
[im 48/91  soft-tissue]
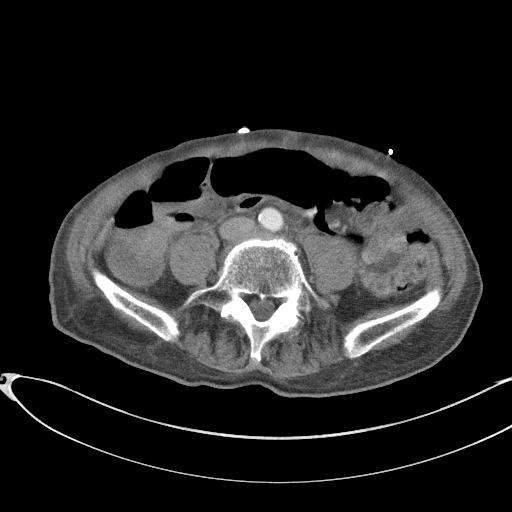
[im 57/91  soft-tissue]
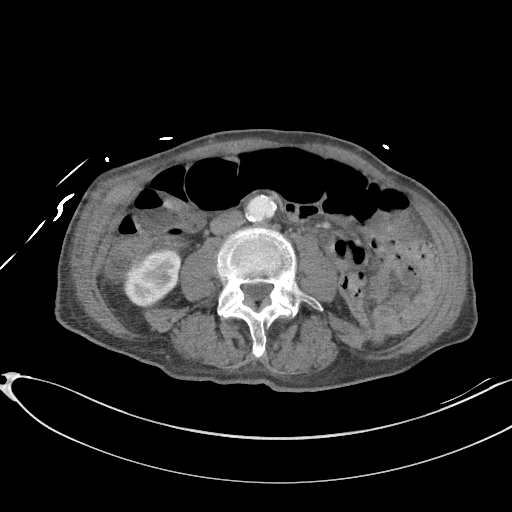
[im 62/91  soft-tissue]
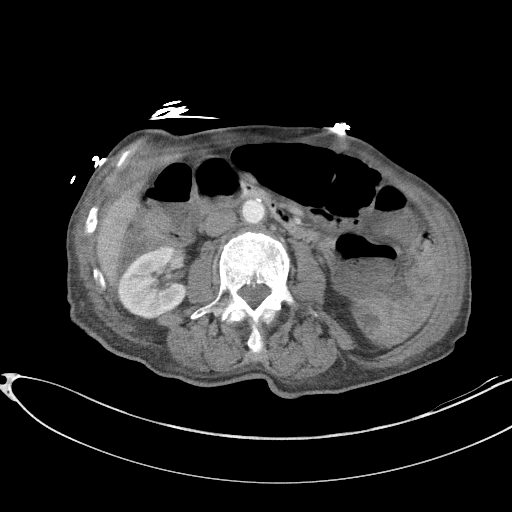
[im 62/91  bone]
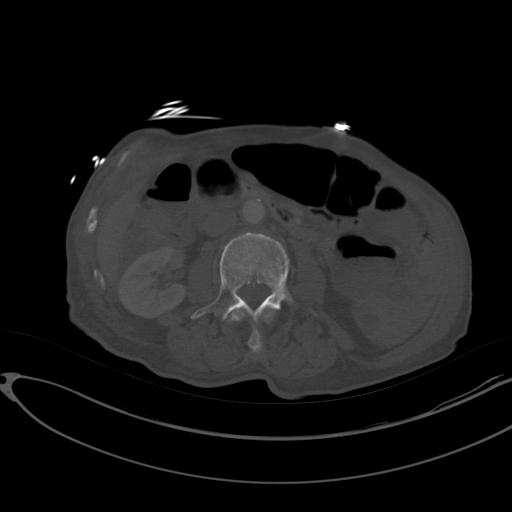
[im 72/91  soft-tissue]
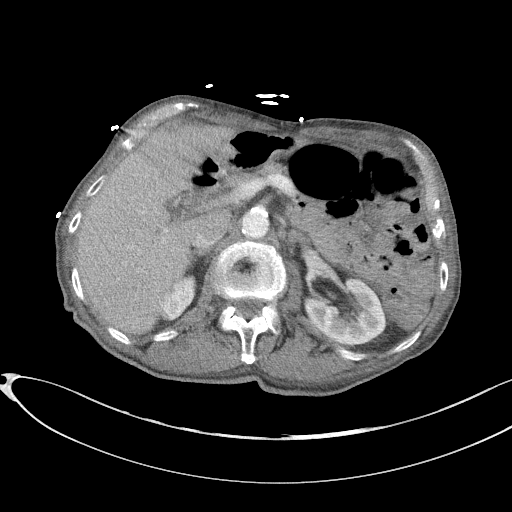
[im 76/91  soft-tissue]
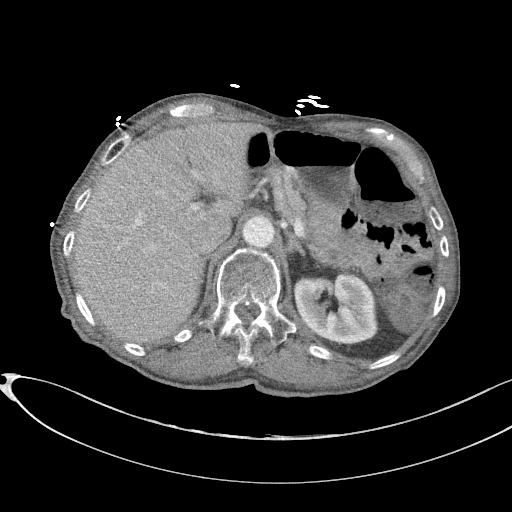
[im 86/91  soft-tissue]
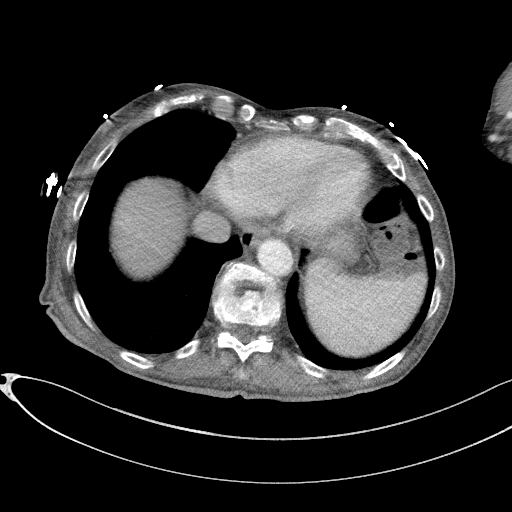

[Series 6: a/p w/ cor · coronal · 0.70mm/px · 3 of 150 slices shown]
[im 50/150  soft-tissue]
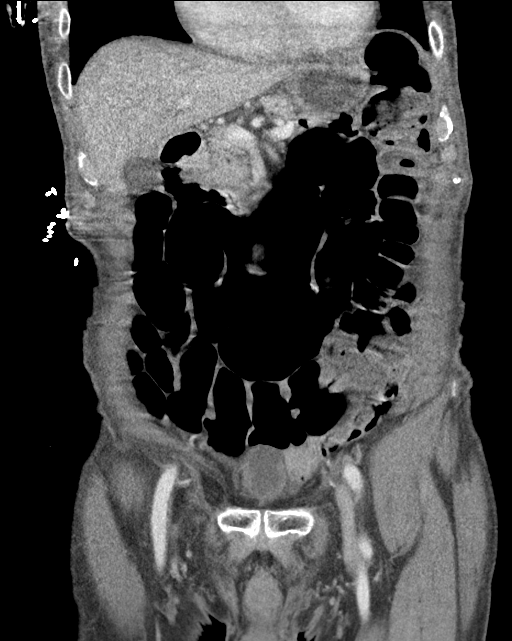
[im 67/150  soft-tissue]
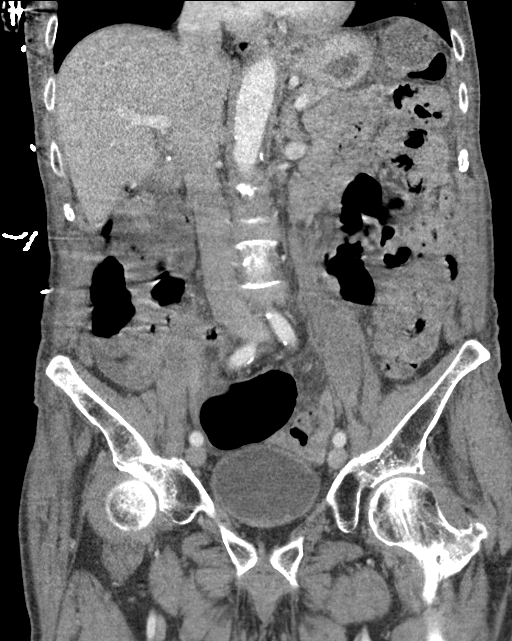
[im 83/150  soft-tissue]
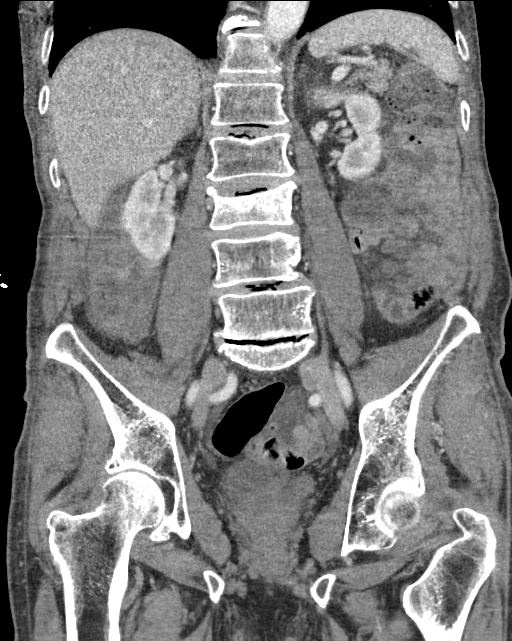

[15 of 46 positions shown; findings below may reference images not displayed]

FINDINGS: Lower chest: Unremarkable.

Hepatobiliary: 1.8 cm low-attenuation lesion in segment 4A/4B of the
liver, compatible with a simple cyst. No other suspicious hepatic
lesions. No intra or extrahepatic biliary ductal dilatation. 5 mm
calcified gallstone lying dependently in the gallbladder.
Gallbladder is not distended. No pericholecystic fluid or
inflammatory changes.

Pancreas: No pancreatic mass. No pancreatic ductal dilatation. No
pancreatic or peripancreatic fluid collections or inflammatory
changes.

Spleen: Unremarkable.

Adrenals/Urinary Tract: 1.2 cm low-attenuation lesion in the lower
pole of the right kidney, compatible with a simple cyst. Tiny 1-2 mm
nonobstructive calculi in the lower pole collecting system of the
left kidney. Bilateral adrenal glands are normal in appearance. No
hydroureteronephrosis. Urinary bladder is normal in appearance.

Stomach/Bowel: Normal appearance of the stomach. No pathologic
dilatation of small bowel or colon. The appendix is not confidently
identified and may be surgically absent. Regardless, there are no
inflammatory changes noted adjacent to the cecum to suggest the
presence of an acute appendicitis at this time.

Vascular/Lymphatic: Aortic atherosclerosis, without evidence of
aneurysm or dissection in the abdominal or pelvic vasculature. No
lymphadenopathy noted in the abdomen or pelvis.

Reproductive: Prostate gland and seminal vesicles are unremarkable
in appearance.

Other: No significant volume of ascites.  No pneumoperitoneum.

Musculoskeletal: Compression fracture of the superior endplate of L3
with 40% loss of central vertebral body height, new compared to the
prior examination, without surrounding soft tissue swelling
(suggesting a nonacute injury). There are no aggressive appearing
lytic or blastic lesions noted in the visualized portions of the
skeleton.
IMPRESSION: 1. No acute findings are noted in the abdomen or pelvis to account
for the patient's symptoms.
2. Tiny 1-2 mm nonobstructive calculi in the lower pole collecting
system of left kidney. No ureteral stones or findings of urinary
tract obstruction are noted at this time.
3. Cholelithiasis without evidence of acute cholecystitis.
4. Aortic atherosclerosis.
5. New but nonacute compression fracture of superior endplate of L3
with 40% loss of central vertebral body height.
6. Additional incidental findings, as above.
